# Patient Record
Sex: Female | Born: 1950 | Race: White | Hispanic: No | Marital: Single | Smoking: Never smoker
Health system: Southern US, Community
[De-identification: ages and names within clinical notes are randomized; demographics above are authoritative.]

## PROBLEM LIST (undated history)

## (undated) DIAGNOSIS — K219 Gastro-esophageal reflux disease without esophagitis: Secondary | ICD-10-CM

## (undated) DIAGNOSIS — Z8619 Personal history of other infectious and parasitic diseases: Secondary | ICD-10-CM

## (undated) DIAGNOSIS — Z8709 Personal history of other diseases of the respiratory system: Secondary | ICD-10-CM

## (undated) DIAGNOSIS — K279 Peptic ulcer, site unspecified, unspecified as acute or chronic, without hemorrhage or perforation: Secondary | ICD-10-CM

## (undated) DIAGNOSIS — R011 Cardiac murmur, unspecified: Secondary | ICD-10-CM

## (undated) DIAGNOSIS — Z9889 Other specified postprocedural states: Secondary | ICD-10-CM

## (undated) DIAGNOSIS — M75100 Unspecified rotator cuff tear or rupture of unspecified shoulder, not specified as traumatic: Secondary | ICD-10-CM

## (undated) DIAGNOSIS — R918 Other nonspecific abnormal finding of lung field: Secondary | ICD-10-CM

## (undated) DIAGNOSIS — R079 Chest pain, unspecified: Secondary | ICD-10-CM

## (undated) DIAGNOSIS — M1990 Unspecified osteoarthritis, unspecified site: Secondary | ICD-10-CM

## (undated) DIAGNOSIS — I10 Essential (primary) hypertension: Secondary | ICD-10-CM

## (undated) DIAGNOSIS — T8859XA Other complications of anesthesia, initial encounter: Secondary | ICD-10-CM

## (undated) DIAGNOSIS — N390 Urinary tract infection, site not specified: Secondary | ICD-10-CM

## (undated) DIAGNOSIS — G8929 Other chronic pain: Secondary | ICD-10-CM

## (undated) DIAGNOSIS — T4145XA Adverse effect of unspecified anesthetic, initial encounter: Secondary | ICD-10-CM

## (undated) DIAGNOSIS — Z87442 Personal history of urinary calculi: Secondary | ICD-10-CM

## (undated) DIAGNOSIS — M549 Dorsalgia, unspecified: Secondary | ICD-10-CM

## (undated) DIAGNOSIS — J309 Allergic rhinitis, unspecified: Secondary | ICD-10-CM

## (undated) DIAGNOSIS — J45909 Unspecified asthma, uncomplicated: Secondary | ICD-10-CM

## (undated) DIAGNOSIS — R112 Nausea with vomiting, unspecified: Secondary | ICD-10-CM

## (undated) HISTORY — DX: Peptic ulcer, site unspecified, unspecified as acute or chronic, without hemorrhage or perforation: K27.9

## (undated) HISTORY — DX: Essential (primary) hypertension: I10

## (undated) HISTORY — DX: Gastro-esophageal reflux disease without esophagitis: K21.9

## (undated) HISTORY — DX: Other nonspecific abnormal finding of lung field: R91.8

## (undated) HISTORY — DX: Unspecified osteoarthritis, unspecified site: M19.90

## (undated) HISTORY — DX: Other chronic pain: G89.29

## (undated) HISTORY — PX: EYE SURGERY: SHX253

## (undated) HISTORY — PX: BACK SURGERY: SHX140

## (undated) HISTORY — DX: Chest pain, unspecified: R07.9

## (undated) HISTORY — DX: Dorsalgia, unspecified: M54.9

---

## 1997-06-09 HISTORY — PX: VAGINAL HYSTERECTOMY: SUR661

## 1997-08-22 HISTORY — PX: OTHER SURGICAL HISTORY: SHX169

## 2004-04-17 ENCOUNTER — Ambulatory Visit: Payer: Self-pay | Admitting: Internal Medicine

## 2005-01-29 ENCOUNTER — Ambulatory Visit: Payer: Self-pay | Admitting: Internal Medicine

## 2005-03-31 ENCOUNTER — Emergency Department: Payer: Self-pay | Admitting: Emergency Medicine

## 2005-04-08 ENCOUNTER — Ambulatory Visit: Payer: Self-pay | Admitting: Internal Medicine

## 2005-04-21 ENCOUNTER — Ambulatory Visit: Payer: Self-pay | Admitting: Internal Medicine

## 2005-12-19 ENCOUNTER — Ambulatory Visit: Payer: Self-pay | Admitting: Gastroenterology

## 2005-12-29 ENCOUNTER — Ambulatory Visit: Payer: Self-pay | Admitting: Gastroenterology

## 2008-01-18 ENCOUNTER — Emergency Department: Payer: Self-pay | Admitting: Emergency Medicine

## 2008-01-18 ENCOUNTER — Other Ambulatory Visit: Payer: Self-pay

## 2008-07-20 ENCOUNTER — Ambulatory Visit: Payer: Self-pay | Admitting: Internal Medicine

## 2009-06-15 ENCOUNTER — Ambulatory Visit: Payer: Self-pay | Admitting: General Practice

## 2009-07-26 ENCOUNTER — Ambulatory Visit: Payer: Self-pay | Admitting: Internal Medicine

## 2010-07-03 ENCOUNTER — Observation Stay: Payer: Self-pay | Admitting: Internal Medicine

## 2010-09-09 ENCOUNTER — Ambulatory Visit: Payer: Self-pay | Admitting: Internal Medicine

## 2010-10-30 ENCOUNTER — Ambulatory Visit: Payer: Self-pay | Admitting: Gastroenterology

## 2010-11-05 ENCOUNTER — Ambulatory Visit: Payer: Self-pay | Admitting: Gastroenterology

## 2010-12-01 ENCOUNTER — Ambulatory Visit: Payer: Self-pay | Admitting: General Practice

## 2011-01-08 HISTORY — PX: ESOPHAGOGASTRODUODENOSCOPY: SHX1529

## 2011-01-17 ENCOUNTER — Ambulatory Visit: Payer: Self-pay | Admitting: Gastroenterology

## 2011-01-21 ENCOUNTER — Ambulatory Visit: Payer: Self-pay | Admitting: Internal Medicine

## 2011-01-21 LAB — PATHOLOGY REPORT

## 2011-03-06 ENCOUNTER — Ambulatory Visit: Payer: Self-pay | Admitting: Internal Medicine

## 2011-06-10 HISTORY — PX: COLONOSCOPY: SHX174

## 2011-09-23 ENCOUNTER — Ambulatory Visit: Payer: Self-pay | Admitting: Internal Medicine

## 2012-01-27 ENCOUNTER — Ambulatory Visit: Payer: Self-pay | Admitting: Internal Medicine

## 2012-03-10 ENCOUNTER — Ambulatory Visit: Payer: Self-pay | Admitting: Unknown Physician Specialty

## 2012-03-23 ENCOUNTER — Telehealth: Payer: Self-pay | Admitting: Internal Medicine

## 2012-03-23 NOTE — Telephone Encounter (Signed)
The results were probably sent to [facility].  I have not received.  Thanks.

## 2012-03-23 NOTE — Telephone Encounter (Signed)
Pt called wanted follow up appointment from ct done couple weeks ago at [facility]

## 2012-03-26 NOTE — Telephone Encounter (Deleted)
Called pt to let her know that she needs to contact [place] about her ct.

## 2012-03-26 NOTE — Telephone Encounter (Signed)
Called pt at home and left message to let her know that she needs to contact [place] about her ct.

## 2012-03-31 NOTE — Telephone Encounter (Signed)
Called pt at home and informed her that we have not received her results. Informed pt to contact [facility] about her results and records. Pt stated that she would contact [facility].

## 2012-04-30 ENCOUNTER — Ambulatory Visit: Payer: Self-pay | Admitting: Gastroenterology

## 2012-05-10 ENCOUNTER — Ambulatory Visit: Payer: Self-pay | Admitting: Gastroenterology

## 2012-05-18 ENCOUNTER — Telehealth: Payer: Self-pay | Admitting: Internal Medicine

## 2012-05-18 NOTE — Telephone Encounter (Signed)
Pt is calling and saying that she sent for her records at [facility]. She is saying that she really needs to see Dr. Lorin Picket because of her condition ???

## 2012-05-18 NOTE — Telephone Encounter (Signed)
Called patient and left message for her to return call

## 2012-05-18 NOTE — Telephone Encounter (Signed)
Patient is returning your call.  

## 2012-05-18 NOTE — Telephone Encounter (Signed)
Need to know what is going on with pt.  Who did she last see at [facility] and who ordered the scan.  Need more information.

## 2012-05-19 NOTE — Telephone Encounter (Signed)
Called patient back. Patient wants to see you about her past issues of pain under her ribs. Patient is still having problems. (Ulcers)

## 2012-05-19 NOTE — Telephone Encounter (Signed)
I can see her at 11:45 on Friday 05/21/12.

## 2012-05-19 NOTE — Telephone Encounter (Signed)
Patient notified. Appointment made.

## 2012-05-19 NOTE — Telephone Encounter (Signed)
Pt is returning your call and was wanting an appt as soon as possible with Dr. Lorin Picket.

## 2012-05-20 NOTE — Telephone Encounter (Signed)
Per Patient Dawn Vargas scheduled appointment 05-21-12@11 :00

## 2012-05-21 ENCOUNTER — Encounter: Payer: Self-pay | Admitting: Internal Medicine

## 2012-05-21 ENCOUNTER — Ambulatory Visit (INDEPENDENT_AMBULATORY_CARE_PROVIDER_SITE_OTHER): Payer: No Typology Code available for payment source | Admitting: Internal Medicine

## 2012-05-21 VITALS — BP 130/84 | HR 74 | Temp 98.8°F | Ht 65.5 in | Wt 190.2 lb

## 2012-05-21 DIAGNOSIS — I10 Essential (primary) hypertension: Secondary | ICD-10-CM

## 2012-05-21 DIAGNOSIS — K219 Gastro-esophageal reflux disease without esophagitis: Secondary | ICD-10-CM

## 2012-05-21 DIAGNOSIS — R911 Solitary pulmonary nodule: Secondary | ICD-10-CM

## 2012-05-21 DIAGNOSIS — N200 Calculus of kidney: Secondary | ICD-10-CM

## 2012-05-23 ENCOUNTER — Telehealth: Payer: Self-pay | Admitting: Internal Medicine

## 2012-05-23 ENCOUNTER — Encounter: Payer: Self-pay | Admitting: Internal Medicine

## 2012-05-23 DIAGNOSIS — R911 Solitary pulmonary nodule: Secondary | ICD-10-CM | POA: Insufficient documentation

## 2012-05-23 DIAGNOSIS — K219 Gastro-esophageal reflux disease without esophagitis: Secondary | ICD-10-CM | POA: Insufficient documentation

## 2012-05-23 DIAGNOSIS — I10 Essential (primary) hypertension: Secondary | ICD-10-CM | POA: Insufficient documentation

## 2012-05-23 DIAGNOSIS — N200 Calculus of kidney: Secondary | ICD-10-CM | POA: Insufficient documentation

## 2012-05-23 NOTE — Telephone Encounter (Signed)
Please schedule her for a physical in 4/14 - after 09/17/11.  Thanks.

## 2012-05-23 NOTE — Assessment & Plan Note (Signed)
Blood pressure under good control.  Same med regimen.  Check metabolic panel.

## 2012-05-23 NOTE — Progress Notes (Signed)
Subjective:    Patient ID: Dawn Vargas, female    DOB: 07/09/1950, 61 y.o.   MRN: [id number]  HPI 61 year old female with past history of chronic back pain requiring surgery 1989, hypertension and IBS.  She comes in today for a work in with concerns regarding persistent abdominal discomfort.  She was originally hospitalized in 06/2010 for discomfort and had a heart w/up.  Was negative.  Subsequently had EGD - revealed ulcers.  Instructed to take carafate.  Did well for a while.  02/2012 - pain returned.  Saw GI 03/10/12.  Followed up [facility] in 11/13.  Was given a Prevpac.  Did not tolerate Biaxin, but states the antibiotic made her feel better.  After off, again developed bloating and discomfort.  Some nausea.  04/29/12 EGD and colonoscopy.  EGD - irregular Z line, tortuous esophagus and normal stomach and duodenum.  Colonoscopy revealed a one 2mm polyp in the sigmoid colon and redundant colon.  Was given hyoscyamine.  Did not tolerate - caused dry mouth, depression and headaches.  The discomfort is localized under the ribs - bilateral.  No associated bowel change.  She had a dental implant yesterday and is on abx for this.  Since being on abx, the symptoms have improved.  She feels better now.  No change with food or eating.  Had a recent MRI (adrenal) - normal.  CT chest ok.    Past Medical History  Diagnosis Date  . Hypertension   . Chronic back pain     s/p surgery for herniated disc 93/89)  . Arthritis   . GERD (gastroesophageal reflux disease)   . Hypertension     Review of Systems Patient denies any headache, lightheadedness or dizziness.  No significant sinus or allergy symptoms.  No chest pain, tightness or palpitations.  No increased shortness of breath, cough or congestion.  Does report the discomfort as outlined.  W/up as outlined.   No bowel change.  No BRBPR or melana.  No urine change.   Symptoms better on abx.      Objective:   Physical Exam Filed Vitals:   05/21/12 1223  BP: 130/84  Pulse: 74  Temp: 98.8 F (12.67 C)   61 year old female in no acute distress.   HEENT:  Nares - clear.  OP- without lesions or erythema.  NECK:  Supple, nontender.  No audible bruit.   HEART:  Appears to be regular. LUNGS:  Without crackles or wheezing audible.  Respirations even and unlabored.   RADIAL PULSE:  Equal bilaterally.  ABDOMEN:  Soft, nontender.  No audible abdominal bruit.   EXTREMITIES:  No increased edema to be present.                  Assessment & Plan:  ABDOMINAL DISCOMFORT.  Symptoms as outlined.  Has seen GI.  W/up as outlined.  Will obtain labs and recent scans for review.  Feeling better now.  May need CT abdomen/pelvis if persistent.    CARDIOVASCULAR.  Previous stress test negative for ischemia.  Continue risk factor modification.   INCREASED PSYCHOSOCIAL STRESSORS.  Doing well.  Follow.    PREVIOUS ABNORMAL MAMMOGRAM.  Mammogram 01/22/11 - Birads II.  Obtain results of 2013 mammogram.   LEFT ADRENAL FULLNESS.  Adrenal fullness on CT chest.  Appears to be nonfunctioning.  Recent MRI per her report - negative.  Obtain results.    HEALTH MAINTENANCE.  Physical 09/17/11.  She is s/p hysterectomy.  Mammogram - as above. Colonoscopy 04/30/12 revealed one 2mm polyp in the sigmoid colon and redundant colon.

## 2012-05-23 NOTE — Assessment & Plan Note (Signed)
Previous CT revealed 4mm nodule (LLL).  Just had a follow up CT - states was ok.  Follow.  Obtain results.  (CT 03/06/11 revealed the nodules to be unchanged. ) obtain last CT.

## 2012-05-23 NOTE — Assessment & Plan Note (Addendum)
Stable.  Currently without symptoms.

## 2012-05-23 NOTE — Assessment & Plan Note (Signed)
On Aciphex.  EGD 11/13 revealed an irregular Z line with tortuous esophagus.  Currently no reflux.  Follow.

## 2012-05-26 NOTE — Telephone Encounter (Signed)
Opened in error

## 2012-06-07 NOTE — Telephone Encounter (Signed)
Left Message for pt to call back and schedule.

## 2012-06-09 ENCOUNTER — Telehealth: Payer: Self-pay | Admitting: Internal Medicine

## 2012-06-09 NOTE — Telephone Encounter (Signed)
Notify pt that I reviewed the outside scans and labs.  Work up for her persistent abdominal pain - has been unrevealing.  Please see how pt is feeling now (after completing abx).  Also she is due a physical with me after 09/16/12.  Need to schedule this appt.  Thanks.

## 2012-06-10 NOTE — Telephone Encounter (Signed)
Called patient, she was busy she will call me back.

## 2012-06-11 NOTE — Telephone Encounter (Signed)
Pain went away (some what) when she was taking amoxicillin, now that she is no longer taking abx, right side is bothering her now with a lot pain. Patient says now she know longer feels well. Does not expect a call tonight.

## 2012-06-11 NOTE — Telephone Encounter (Signed)
Pt left message on voice mail 1/2 @ 5:02 returning your call

## 2012-06-12 NOTE — Telephone Encounter (Signed)
Left message for pt.  I was going to try to work her in Monday at 9:00.

## 2012-06-13 NOTE — Telephone Encounter (Signed)
I called pt again. Still unable to reach.  I left her a message.  I explained the 9:00 spot had been taken and that I could see about working her in later in the day (or if Monday not good - a later day).  She is too call back about scheduling an appt.  Thanks.

## 2012-06-14 NOTE — Telephone Encounter (Signed)
I am unable to work her in today.  I can see her tomorrow at 3:00 - will need to block 30 min - thanks - abdominal pain

## 2012-06-14 NOTE — Telephone Encounter (Signed)
Patient returned call, she said that she would be willing to come in whenever you could fit her in today. We can reach all day on cell ([phone]).

## 2012-06-14 NOTE — Telephone Encounter (Signed)
Pt aware of appointment 

## 2012-06-15 ENCOUNTER — Encounter: Payer: Self-pay | Admitting: Internal Medicine

## 2012-06-15 ENCOUNTER — Ambulatory Visit (INDEPENDENT_AMBULATORY_CARE_PROVIDER_SITE_OTHER): Payer: No Typology Code available for payment source | Admitting: Internal Medicine

## 2012-06-15 VITALS — BP 130/80 | HR 69 | Temp 98.0°F | Ht 65.5 in | Wt 194.2 lb

## 2012-06-15 DIAGNOSIS — I10 Essential (primary) hypertension: Secondary | ICD-10-CM

## 2012-06-15 DIAGNOSIS — K219 Gastro-esophageal reflux disease without esophagitis: Secondary | ICD-10-CM

## 2012-06-15 DIAGNOSIS — R109 Unspecified abdominal pain: Secondary | ICD-10-CM

## 2012-06-15 DIAGNOSIS — R911 Solitary pulmonary nodule: Secondary | ICD-10-CM

## 2012-06-17 ENCOUNTER — Ambulatory Visit: Payer: Self-pay | Admitting: Internal Medicine

## 2012-06-21 ENCOUNTER — Other Ambulatory Visit: Payer: Self-pay | Admitting: Internal Medicine

## 2012-06-21 MED ORDER — SPIRONOLACTONE 25 MG PO TABS: 25.0000 mg | ORAL_TABLET | Freq: Every day | ORAL | Status: DC

## 2012-06-21 NOTE — Telephone Encounter (Signed)
spironolactone (ALDACTONE) 25 MG tablet  # 30

## 2012-06-21 NOTE — Telephone Encounter (Signed)
Sent in to pharmacy.  

## 2012-06-22 ENCOUNTER — Telehealth: Payer: Self-pay | Admitting: *Deleted

## 2012-06-22 ENCOUNTER — Encounter: Payer: Self-pay | Admitting: Internal Medicine

## 2012-06-22 NOTE — Telephone Encounter (Signed)
Called patient with results. Patient stated that she wants to go ahead with HIDA scan. The best days for patient will be on Wednesday 06/23/12 11-1 and Thursday 06/24/12 11-1. Monday 06/28/12 all day. Please call patient with appointment.

## 2012-06-28 ENCOUNTER — Telehealth: Payer: Self-pay | Admitting: Internal Medicine

## 2012-06-28 ENCOUNTER — Encounter: Payer: Self-pay | Admitting: Internal Medicine

## 2012-06-28 DIAGNOSIS — R109 Unspecified abdominal pain: Secondary | ICD-10-CM

## 2012-06-28 NOTE — Progress Notes (Signed)
Subjective:    Patient ID: Dawn Vargas, female    DOB: May 07, 1951, 62 y.o.   MRN: [id number]  Abdominal Pain  62 year old female with past history of chronic back pain requiring surgery 1989, hypertension and IBS.  She comes in today for a work in with concerns regarding persistent abdominal discomfort.  She was originally hospitalized in 06/2010 for discomfort and had a heart w/up.  Was negative.  Subsequently had EGD - revealed ulcers.  Instructed to take carafate.  Did well for a while.  02/2012 - pain returned.  Saw GI 03/10/12.  Followed up at [facility] in 11/13.  Was given a Prevpac.  Did not tolerate Biaxin, but states the antibiotic made her feel better.  After off, again developed bloating and discomfort.  Some nausea.  04/29/12 EGD and colonoscopy.  EGD - irregular Z line, tortuous esophagus and normal stomach and duodenum.  Colonoscopy revealed a one 2mm polyp in the sigmoid colon and redundant colon.  Was given hyoscyamine.  Did not tolerate - caused dry mouth, depression and headaches.  The discomfort is localized under the ribs - bilateral.  No associated bowel change.  Has noticed more on the right lately.   Had a recent MRI (adrenal) - normal. CT chest ok.  Last week - worse.  Increased bloating.  Felt sick.  Restarted the amoxicillin (she had left over) with noted improvement.  Some better today.  No vomiting.  Eating and drinking.     Past Medical History  Diagnosis Date  . Hypertension   . Chronic back pain     s/p surgery for herniated disc 93/89)  . Arthritis   . GERD (gastroesophageal reflux disease)   . Hypertension     Current Outpatient Prescriptions on File Prior to Visit  Medication Sig Dispense Refill  . amoxicillin (AMOXIL) 500 MG capsule Take 500 mg by mouth 4 (four) times daily.      Marland Kitchen losartan (COZAAR) 25 MG tablet Take 2 per day      . traMADol (ULTRAM) 50 MG tablet Take 50 mg by mouth as needed.      Marland Kitchen spironolactone (ALDACTONE) 25 MG tablet Take 1  tablet (25 mg total) by mouth daily.  30 tablet  5    Review of Systems  Gastrointestinal: Positive for abdominal pain.  Patient denies any headache, lightheadedness or dizziness.  No significant sinus or allergy symptoms.  No chest pain, tightness or palpitations.  No increased shortness of breath, cough or congestion.  Does report the discomfort as outlined.  W/up as outlined.   No bowel change.  No BRBPR or melana.  No urine change.   Symptoms better on abx.      Objective:   Physical Exam  Filed Vitals:   06/15/12 1509  BP: 130/80  Pulse: 69  Temp: 98 F (31.72 C)   62 year old female in no acute distress.   HEENT:  Nares - clear.  OP- without lesions or erythema.  NECK:  Supple, nontender.  No audible bruit.   HEART:  Appears to be regular. LUNGS:  Without crackles or wheezing audible.  Respirations even and unlabored.   RADIAL PULSE:  Equal bilaterally.  ABDOMEN:  Soft.  No significant tenderness to palpation.  No audible abdominal bruit.   EXTREMITIES:  No increased edema to be present.                  Assessment & Plan:  ABDOMINAL DISCOMFORT.  Symptoms  as outlined.  Has seen GI.  W/up as outlined.  MRI recently did not reveal any acute abdominal abnormality.  CT chest - did no reveal any acute abdominal abnormality - what was visualized.  Will recheck abdominal ultrasound.  May need HIDA scan.  further w/up pending results.  Recent labs unrevealing (normal liver panel, amylase and lipase).      CARDIOVASCULAR.  Previous stress test negative for ischemia.  Continue risk factor modification.   INCREASED PSYCHOSOCIAL STRESSORS.  Doing well.  Follow.    PREVIOUS ABNORMAL MAMMOGRAM.  Mammogram 01/22/11 - Birads II.  Obtain results of 2013 mammogram.   LEFT ADRENAL FULLNESS.  Adrenal fullness on CT chest.  Appears to be nonfunctioning.  Recent MRI negative.    HEALTH MAINTENANCE.  Physical 09/17/11.  She is s/p hysterectomy.  Mammogram - as above. Colonoscopy 04/30/12 revealed  one 2mm polyp in the sigmoid colon and redundant colon.

## 2012-06-28 NOTE — Assessment & Plan Note (Signed)
Obtain CT and review.  Per pt - ok.  Follow.

## 2012-06-28 NOTE — Assessment & Plan Note (Signed)
Reflux symptoms appear to be controlled.  Being followed by GI.  Just worked up and evaluated.

## 2012-06-28 NOTE — Telephone Encounter (Signed)
Order placed for HIDA scan.

## 2012-06-28 NOTE — Assessment & Plan Note (Signed)
Blood pressure much better.  Follow.   

## 2012-06-30 ENCOUNTER — Encounter: Payer: Self-pay | Admitting: Internal Medicine

## 2012-07-15 ENCOUNTER — Ambulatory Visit: Payer: Self-pay | Admitting: Internal Medicine

## 2012-07-22 ENCOUNTER — Telehealth: Payer: Self-pay | Admitting: Internal Medicine

## 2012-07-22 DIAGNOSIS — R109 Unspecified abdominal pain: Secondary | ICD-10-CM

## 2012-07-22 NOTE — Telephone Encounter (Signed)
Order placed for referral to general surgery - Dr Lemar Livings.  I will place the HIDA scan on your desk.  Please also send my last couple of notes and her scans and ultrasounds.

## 2012-08-11 ENCOUNTER — Encounter: Payer: Self-pay | Admitting: Internal Medicine

## 2012-08-25 ENCOUNTER — Ambulatory Visit: Payer: Self-pay | Admitting: General Surgery

## 2012-08-25 DIAGNOSIS — K811 Chronic cholecystitis: Secondary | ICD-10-CM

## 2012-08-25 HISTORY — PX: CHOLECYSTECTOMY: SHX55

## 2012-08-26 LAB — PATHOLOGY REPORT

## 2012-08-31 ENCOUNTER — Telehealth: Payer: Self-pay | Admitting: General Surgery

## 2012-08-31 NOTE — Telephone Encounter (Signed)
PT CALLED & WOULD LIKE A RETURN TO WORK NOTE FOR 09-06-12(? LIMITATIONS). P/O APPT [facility] FOR 09-15-12. SHE ALSO WANTED TO REPORT HER REACTION TO OXYCODONE GIVEN AFTER SX.SHE WAS VERY DIZZY/LIGHT HEADED,NO APPETITE.SHE STOPPED TAKING IT AND HER SYMPTOMS WENT AWAY.SHE HAS BEEN TAKING ADVIL INSTEAD./MTH

## 2012-09-02 ENCOUNTER — Encounter: Payer: Self-pay | Admitting: General Surgery

## 2012-09-15 ENCOUNTER — Encounter: Payer: Self-pay | Admitting: General Surgery

## 2012-09-15 ENCOUNTER — Ambulatory Visit (INDEPENDENT_AMBULATORY_CARE_PROVIDER_SITE_OTHER): Payer: No Typology Code available for payment source | Admitting: General Surgery

## 2012-09-15 VITALS — BP 144/76 | HR 60 | Resp 12 | Ht 65.0 in | Wt 188.0 lb

## 2012-09-15 DIAGNOSIS — K811 Chronic cholecystitis: Secondary | ICD-10-CM

## 2012-09-15 DIAGNOSIS — G8929 Other chronic pain: Secondary | ICD-10-CM

## 2012-09-15 DIAGNOSIS — R1011 Right upper quadrant pain: Secondary | ICD-10-CM

## 2012-09-15 NOTE — Progress Notes (Signed)
Patient ID: Dawn Vargas, female   DOB: March 29, 1951, 62 y.o.   MRN: [id number]  Chief Complaint  Patient presents with  . Routine Post Op    gallbladder    HPI Dawn Vargas is a 62 y.o. female here today for a post op gallbladder surgery done August 25 2012. Patient states mild discomfort right abdomen occasionally requiring no pain medication, and she is back at work. The patient reports a severe pain she was experiencing prior to surgery has completely resolved. HPI  Past Medical History  Diagnosis Date  . Hypertension   . Chronic back pain     s/p surgery for herniated disc 93/89)  . Arthritis   . GERD (gastroesophageal reflux disease)   . Hypertension     Past Surgical History  Procedure Laterality Date  . Vaginal hysterectomy  1999    laparoscopic assisted  . Cholecystectomy  August 25 2012    Family History  Problem Relation Age of Onset  . Dementia Father   . Prostate cancer Father   . Heart disease Mother     myocardial infarction, congestive heart failure  . Hypertension Mother   . Heart disease Brother     angioplasty  . Hypertension Brother   . Hypertension Sister   . Breast cancer Neg Hx   . Colon cancer Neg Hx     Social History History  Substance Use Topics  . Smoking status: Former Games developer  . Smokeless tobacco: Never Used  . Alcohol Use: No    Allergies  Allergen Reactions  . Benadryl (Diphenhydramine Hcl) Hives  . Caladryl (Pramoxine-Calamine)   . Cleocin (Clindamycin Hcl) Other (See Comments)    GI upset   . Oxycodone     dizzy    Current Outpatient Prescriptions  Medication Sig Dispense Refill  . losartan (COZAAR) 25 MG tablet Take 2 per day      . spironolactone (ALDACTONE) 25 MG tablet Take 1 tablet (25 mg total) by mouth daily.  30 tablet  5   No current facility-administered medications for this visit.    Review of Systems Review of Systems  Constitutional: Negative.   Respiratory: Negative.   Cardiovascular: Negative.      Blood pressure 144/76, pulse 60, resp. rate 12, height 5\' 5"  (1.651 m), weight 188 lb (85.276 kg), last menstrual period 05/21/1998.  Physical Exam Physical Exam  Constitutional: She is oriented to person, place, and time. She appears well-developed and well-nourished.  Cardiovascular: Normal rate and regular rhythm.   Pulmonary/Chest: Effort normal and breath sounds normal.  Abdominal: Soft.  Neurological: She is alert and oriented to person, place, and time.  Skin: Skin is warm and dry.  Well healed lap sites  Data Reviewed Pathology showed no evidence of chronic cholecystitis or cholelithiasis.  Assessment    Right upper quadrant pain resolved status post cholecystectomy    Plan    The patient is released to full activity.       Dawn Vargas 09/16/2012, 7:57 PM

## 2012-09-15 NOTE — Patient Instructions (Addendum)
Resume activities as tolerated

## 2012-09-20 ENCOUNTER — Other Ambulatory Visit: Payer: Self-pay | Admitting: *Deleted

## 2012-09-20 MED ORDER — LOSARTAN POTASSIUM 25 MG PO TABS: 25.0000 mg | ORAL_TABLET | Freq: Two times a day (BID) | ORAL | Status: DC

## 2012-09-20 NOTE — Telephone Encounter (Signed)
Pt has next appt in May, & was recently seen in January-refilled RX x 6months

## 2012-10-08 ENCOUNTER — Ambulatory Visit (INDEPENDENT_AMBULATORY_CARE_PROVIDER_SITE_OTHER): Payer: No Typology Code available for payment source | Admitting: Internal Medicine

## 2012-10-08 ENCOUNTER — Encounter: Payer: Self-pay | Admitting: Internal Medicine

## 2012-10-08 VITALS — BP 140/80 | HR 54 | Temp 97.7°F | Ht 65.0 in | Wt 187.2 lb

## 2012-10-08 DIAGNOSIS — R911 Solitary pulmonary nodule: Secondary | ICD-10-CM

## 2012-10-08 DIAGNOSIS — N200 Calculus of kidney: Secondary | ICD-10-CM

## 2012-10-08 DIAGNOSIS — M549 Dorsalgia, unspecified: Secondary | ICD-10-CM

## 2012-10-08 DIAGNOSIS — K219 Gastro-esophageal reflux disease without esophagitis: Secondary | ICD-10-CM

## 2012-10-08 DIAGNOSIS — I10 Essential (primary) hypertension: Secondary | ICD-10-CM

## 2012-10-08 DIAGNOSIS — K259 Gastric ulcer, unspecified as acute or chronic, without hemorrhage or perforation: Secondary | ICD-10-CM

## 2012-10-08 DIAGNOSIS — G8929 Other chronic pain: Secondary | ICD-10-CM

## 2012-10-08 MED ORDER — TRAMADOL HCL 50 MG PO TABS: 50.0000 mg | ORAL_TABLET | Freq: Two times a day (BID) | ORAL | Status: DC | PRN

## 2012-10-09 ENCOUNTER — Encounter: Payer: Self-pay | Admitting: Internal Medicine

## 2012-10-09 DIAGNOSIS — G8929 Other chronic pain: Secondary | ICD-10-CM | POA: Insufficient documentation

## 2012-10-09 DIAGNOSIS — M549 Dorsalgia, unspecified: Secondary | ICD-10-CM | POA: Insufficient documentation

## 2012-10-09 DIAGNOSIS — K259 Gastric ulcer, unspecified as acute or chronic, without hemorrhage or perforation: Secondary | ICD-10-CM | POA: Insufficient documentation

## 2012-10-09 NOTE — Assessment & Plan Note (Addendum)
Previous CT revealed 4mm nodule (LLL).  CT 03/06/11 revealed the nodules to be unchanged.  Follow up CT chest 03/10/12 revealed tiny nodules in both lungs stable.   No new lesions.  Currently asymptomatic.  Follow.

## 2012-10-09 NOTE — Progress Notes (Signed)
Subjective:    Patient ID: Dawn Vargas, female    DOB: [birth year], 62 y.o.   MRN: [id number]  Abdominal Pain  62 year old female with past history of chronic back pain requiring surgery 1989, hypertension and IBS.  She comes in today for a scheduled follow up.  Was initially scheduled for her physical, but states she has several other issues she wants to discuss and wants to postpone her physical.  She was originally hospitalized in 06/2010 for discomfort and had a heart w/up.  Was negative.  Subsequently had EGD - revealed ulcers.  Instructed to take carafate.  Did well for a while.  02/2012 - pain returned.  Saw GI 03/10/12.   04/29/12 EGD and colonoscopy.  EGD - irregular Z line, tortuous esophagus and normal stomach and duodenum.  Colonoscopy revealed a one 2mm polyp in the sigmoid colon and redundant colon.  Last visit here was having persistent abdominal pain.  HIDA scan revealed decreased gallbladder function.  Saw Dr Lemar Livings.  S/p cholecystectomy.  abominal pain is better.  Not an issue for her now.  Energy better.  Eating better.  She is now having some increased issues with her low back and right hip.  Increased pain since February.  States she was doing a lot of pushing and pulling in February and aggravated her back.  Tramadol helps.  Out of tramadol.  Unable to take antiinflammatories.  No numbness or tingling.  Pain extending down the leg.  She also reports she is on amoxicillin now for sinus/uri.  Doing better.  Breathing stable.  No sob.  No cough.     Past Medical History  Diagnosis Date  . Hypertension   . Chronic back pain     s/p surgery for herniated disc 93/89)  . Arthritis   . GERD (gastroesophageal reflux disease)   . Hypertension     Current Outpatient Prescriptions on File Prior to Visit  Medication Sig Dispense Refill  . losartan (COZAAR) 25 MG tablet Take 1 tablet (25 mg total) by mouth 2 (two) times daily.  60 tablet  5  . spironolactone (ALDACTONE) 25 MG tablet  Take 1 tablet (25 mg total) by mouth daily.  30 tablet  5   No current facility-administered medications on file prior to visit.    Review of Systems  Gastrointestinal: Positive for abdominal pain.  Patient denies any headache, lightheadedness or dizziness.  No significant sinus or allergy symptoms now.  Improved.  On amoxicillin.   No chest pain, tightness or palpitations.  No increased shortness of breath, cough or congestion.  Does report the back/hip and right leg discomfort as outlined.  Tramadol helps.   No bowel change.  No BRBPR or melana.  No urine change.       Objective:   Physical Exam  Filed Vitals:   10/08/12 1153  BP: 140/80  Pulse: 54  Temp: 97.7 F (21.77 C)   62 year old female in no acute distress.   HEENT:  Nares - clear.  OP- without lesions or erythema.  NECK:  Supple, nontender.  No audible bruit.   HEART:  Appears to be regular. LUNGS:  Without crackles or wheezing audible.  Respirations even and unlabored.   RADIAL PULSE:  Equal bilaterally.  ABDOMEN:  Soft.  No significant tenderness to palpation.  No audible abdominal bruit.   EXTREMITIES:  No increased edema to be present.   MSK:  Some pulling in the right hip and right buttock region  with straight leg raise.  No motor weakness identified.                Assessment & Plan:  ABDOMINAL DISCOMFORT.  Resolved after gallbladder removed.  Follow.     CARDIOVASCULAR.  Previous stress test negative for ischemia.  Continue risk factor modification.   INCREASED PSYCHOSOCIAL STRESSORS.  Doing well.  Follow.    PREVIOUS ABNORMAL MAMMOGRAM.  Mammogram 01/22/11 - Birads II.  01/27/12 mammogram - Birads II.   LEFT ADRENAL FULLNESS.  Adrenal fullness on CT chest.  Appears to be nonfunctioning.  Recent MRI (09/23/11) negative.    HEALTH MAINTENANCE.  Physical 09/17/11.  She is s/p hysterectomy.  Schedule a physical for next visit.  Mammogram - as above. Colonoscopy 04/30/12 revealed one 2mm polyp in the sigmoid colon  and redundant colon.

## 2012-10-09 NOTE — Assessment & Plan Note (Signed)
Blood pressure has been much better.  Borderline today.  Same medication regimen.  Follow.  Have her spot check her pressures on outside checks.

## 2012-10-09 NOTE — Assessment & Plan Note (Signed)
Reflux symptoms appear to be controlled.  Being followed by GI.

## 2012-10-09 NOTE — Assessment & Plan Note (Signed)
Currently doing well.  Asymptomatic.  Followed by GI.

## 2012-10-09 NOTE — Assessment & Plan Note (Signed)
Stable.  Currently without symptoms.

## 2012-10-09 NOTE — Assessment & Plan Note (Signed)
Has had MRI in the past.  Worked up previously.  Increased pain overt the last few months.  Tramadol helps.  Unable to take antiinflammatories.  Has h/o ulcer disease.  Will xray L-S spine.  Refer to physical therapy.

## 2012-10-13 ENCOUNTER — Ambulatory Visit (INDEPENDENT_AMBULATORY_CARE_PROVIDER_SITE_OTHER)
Admission: RE | Admit: 2012-10-13 | Discharge: 2012-10-13 | Disposition: A | Discharge status: Home / Self Care | Payer: No Typology Code available for payment source | Source: Ambulatory Visit | Attending: Internal Medicine | Admitting: Internal Medicine

## 2012-10-13 DIAGNOSIS — M549 Dorsalgia, unspecified: Secondary | ICD-10-CM

## 2012-10-14 NOTE — Progress Notes (Signed)
LMTCB

## 2012-11-02 ENCOUNTER — Encounter: Payer: Self-pay | Admitting: *Deleted

## 2012-11-03 ENCOUNTER — Other Ambulatory Visit: Payer: Self-pay | Admitting: Internal Medicine

## 2012-11-03 NOTE — Telephone Encounter (Signed)
Ok to refill? Last filled 10/08/12   #30

## 2012-12-16 ENCOUNTER — Other Ambulatory Visit: Payer: Self-pay | Admitting: Internal Medicine

## 2012-12-20 ENCOUNTER — Encounter: Payer: Self-pay | Admitting: Internal Medicine

## 2012-12-31 ENCOUNTER — Encounter: Payer: No Typology Code available for payment source | Admitting: Internal Medicine

## 2013-01-20 ENCOUNTER — Telehealth: Payer: Self-pay | Admitting: Internal Medicine

## 2013-01-20 NOTE — Telephone Encounter (Signed)
FYI. Please advise.

## 2013-01-20 NOTE — Telephone Encounter (Signed)
Patient Information:  Caller Name: Hayle  Phone: [phone]  Patient: Dawn Vargas, Dawn Vargas  Gender: Female  DOB: Oct 12, 1950  Age: 62 Years  PCP: Dale Nance  Office Follow Up:  Does the office need to follow up with this patient?: Yes  Instructions For The Office: Discuss with MD and call back  RN Note:  Called from work. Morning BP's ranging 112-115/70-74.  BP 135/90 at 1245 before Losartin dose.  Also fatigued after eats and feels cold.  Noted these are also Losartin side effects per the Rx literature.  Asking if Potassium level is OK? Office scheduled appointment with Dannial Monarch, NP for 01/25/13. Please call back with MD recommendations.  Symptoms  Reason For Call & Symptoms: Emergent Call transferred from office:  Reports mild dizziness and hypotension; worse when wakes up in the morning.  Dizziness or light headedness present for last 6 weeks.  Reviewed Health History In EMR: Yes  Reviewed Medications In EMR: Yes  Reviewed Allergies In EMR: Yes  Reviewed Surgeries / Procedures: Yes  Date of Onset of Symptoms: 12/09/2012  Guideline(s) Used:  Dizziness  Disposition Per Guideline:   Discuss with PCP and Callback by Nurse Today  Reason For Disposition Reached:   Taking a medicine that could cause dizziness (e.g., blood pressure medications, diuretics)  Advice Given:  Some Causes of Temporary Dizziness:  Poor Fluid Intake - Not drinking enough fluids and being a little dehydrated is a common cause of temporary dizziness. This is always worse during hot weather.  Standing Up Suddenly - Standing up suddenly (especially getting out of bed) or prolonged standing in one place are common causes of temporary dizziness. Not drinking enough fluids always makes it worse. Certain medications can cause or increase this type of dizziness (e.g., blood pressure medications).  Rest for 1-2 Hours:  Lie down with feet elevated for 1 hour. This will improve blood flow and increase blood flow to the  brain.  Stand Up Slowly:  In the mornings, sit up for a few minutes before you stand up. That will help your blood flow make the adjustment.  If you have to stand up for long periods of time, contract and relax your leg muscles to help pump the blood back to the heart.  Sit down or lie down if you feel dizzy.  Call Back If:  Passes out (faints)  You become worse.  Patient Will Follow Care Advice:  YES

## 2013-01-20 NOTE — Telephone Encounter (Signed)
In reviewing, it appears the blood pressure has been ranging 115-130's systolic.  Not extremely low.  I would recommend her continue to monitor her blood pressure.  She takes her losartan 25mg  bid.  If she sees it is low in the am can hold the pm dose of losartan.  Also recommend a follow up earlier than 8/19.  Can she come tomorrow to see Dawn Vargas.

## 2013-01-20 NOTE — Telephone Encounter (Signed)
Tried to reach patient- no answer-left detailed message on voicemail

## 2013-01-21 NOTE — Telephone Encounter (Signed)
Pt coming in on Monday @ 9:30

## 2013-01-24 ENCOUNTER — Encounter: Payer: Self-pay | Admitting: Adult Health

## 2013-01-24 ENCOUNTER — Other Ambulatory Visit: Payer: Self-pay | Admitting: Internal Medicine

## 2013-01-24 ENCOUNTER — Ambulatory Visit (INDEPENDENT_AMBULATORY_CARE_PROVIDER_SITE_OTHER): Payer: No Typology Code available for payment source | Admitting: Adult Health

## 2013-01-24 VITALS — BP 118/82 | HR 60 | Temp 97.7°F | Resp 12 | Wt 185.5 lb

## 2013-01-24 DIAGNOSIS — R42 Dizziness and giddiness: Secondary | ICD-10-CM

## 2013-01-24 NOTE — Progress Notes (Signed)
  Subjective:    Patient ID: Dawn Vargas, female    DOB: 10/01/1950, 62 y.o.   MRN: [id number]  HPI  Patient is a pleasant 62 year old female who presents to clinic with complaints of feeling lightheaded mainly occurring in the morning. She believes this is associated with her blood pressure medication, losartan. Patient is currently taking losartan and spironolactone which both can cause dizziness and lightheadedness. Her blood pressures have been slightly erratic mainly because patient has not been taking medication as ordered because of side effects. She denies chest pain, shortness of breath.   Current Outpatient Prescriptions on File Prior to Visit  Medication Sig Dispense Refill  . spironolactone (ALDACTONE) 25 MG tablet TAKE ONE TABLET BY MOUTH EVERY DAY  30 tablet  0  . traMADol (ULTRAM) 50 MG tablet TAKE ONE TABLET BY MOUTH TWICE DAILY AS NEEDED FOR PAIN  30 tablet  0   No current facility-administered medications on file prior to visit.    Review of Systems  Constitutional: Negative.   HENT: Negative.   Respiratory: Negative.   Cardiovascular: Negative.   Neurological: Positive for light-headedness. Negative for tremors, syncope, weakness and numbness.   BP 118/82  Pulse 60  Temp(Src) 97.7 F (36.5 C) (Oral)  Resp 12  Wt 185 lb 8 oz (84.142 kg)  BMI 30.87 kg/m2  SpO2 97%  LMP 05/21/1998     Objective:   Physical Exam  Constitutional: She is oriented to person, place, and time. She appears well-developed and well-nourished. No distress.  HENT:  Head: Normocephalic and atraumatic.  Right Ear: External ear normal.  Left Ear: External ear normal.  Eyes: Conjunctivae and EOM are normal. Pupils are equal, round, and reactive to light.  Cardiovascular: Normal rate, regular rhythm, normal heart sounds and intact distal pulses.  Exam reveals no gallop and no friction rub.   No murmur heard. Pulmonary/Chest: Effort normal and breath sounds normal. No respiratory  distress. She has no wheezes. She has no rales.  Neurological: She is alert and oriented to person, place, and time. Coordination normal.  Skin: Skin is warm and dry.  Psychiatric: She has a normal mood and affect. Her behavior is normal. Judgment and thought content normal.      Assessment & Plan:

## 2013-01-24 NOTE — Assessment & Plan Note (Signed)
Side effects could be attributed to losartan or spironolactone. Patient is on spironolactone 25 mg daily and she is on losartan 25 mg twice a day. She has been taking her spironolactone as instructed; however, she is only been taking losartan 25 mg daily. Blood pressure appears to be well-controlled in the evening; however, some of her morning readings are slightly elevated. I have instructed patient to begin taking half the dose of spironolactone and maintaining the same dose of losartan. I have instructed her to make one change at a time so that we can determine which one of the medications or doses is affecting her the most. She will keep a log and record of blood pressure readings along with doses of medication so that week and determine how to better adjust her medications and help control her blood pressure better with fewer side effects. Return to clinic in one to 2 weeks.

## 2013-02-01 ENCOUNTER — Ambulatory Visit: Payer: Self-pay | Admitting: Internal Medicine

## 2013-02-01 LAB — HM MAMMOGRAPHY

## 2013-02-08 ENCOUNTER — Encounter: Payer: No Typology Code available for payment source | Admitting: Internal Medicine

## 2013-02-14 ENCOUNTER — Encounter: Payer: Self-pay | Admitting: Internal Medicine

## 2013-02-17 ENCOUNTER — Encounter: Payer: Self-pay | Admitting: Internal Medicine

## 2013-02-17 ENCOUNTER — Ambulatory Visit (INDEPENDENT_AMBULATORY_CARE_PROVIDER_SITE_OTHER): Payer: No Typology Code available for payment source | Admitting: Internal Medicine

## 2013-02-17 VITALS — BP 140/80 | HR 70 | Temp 98.5°F | Ht 64.75 in | Wt 187.5 lb

## 2013-02-17 DIAGNOSIS — M25519 Pain in unspecified shoulder: Secondary | ICD-10-CM

## 2013-02-17 DIAGNOSIS — R911 Solitary pulmonary nodule: Secondary | ICD-10-CM

## 2013-02-17 DIAGNOSIS — M542 Cervicalgia: Secondary | ICD-10-CM | POA: Insufficient documentation

## 2013-02-17 DIAGNOSIS — M549 Dorsalgia, unspecified: Secondary | ICD-10-CM

## 2013-02-17 DIAGNOSIS — R42 Dizziness and giddiness: Secondary | ICD-10-CM

## 2013-02-17 DIAGNOSIS — G8929 Other chronic pain: Secondary | ICD-10-CM

## 2013-02-17 DIAGNOSIS — K259 Gastric ulcer, unspecified as acute or chronic, without hemorrhage or perforation: Secondary | ICD-10-CM

## 2013-02-17 DIAGNOSIS — K219 Gastro-esophageal reflux disease without esophagitis: Secondary | ICD-10-CM

## 2013-02-17 DIAGNOSIS — I10 Essential (primary) hypertension: Secondary | ICD-10-CM

## 2013-02-17 DIAGNOSIS — N200 Calculus of kidney: Secondary | ICD-10-CM

## 2013-02-17 NOTE — Assessment & Plan Note (Signed)
Resolved.  Off spironolactone.  Blood pressure doing well.

## 2013-02-17 NOTE — Assessment & Plan Note (Signed)
Blood pressure has been much better.  Same medication regimen.  Follow.  Check metabolic panel.

## 2013-02-17 NOTE — Assessment & Plan Note (Signed)
Reflux symptoms appear to be controlled.  Being followed by GI.

## 2013-02-17 NOTE — Assessment & Plan Note (Signed)
Lower back pain better.  Follow.

## 2013-02-17 NOTE — Assessment & Plan Note (Signed)
Obtain CT and review.  Per pt - ok.  Follow.

## 2013-02-17 NOTE — Patient Instructions (Signed)
Try miralax daily

## 2013-02-17 NOTE — Progress Notes (Signed)
Subjective:    Patient ID: Dawn Vargas, female    DOB: 1950/09/20, 62 y.o.   MRN: [id number]  HPI 62 year old female with past history of chronic back pain requiring surgery 1989, hypertension and IBS.  She comes in today to follow up on these issues as well as for a complete physical exam.  She was originally hospitalized in 06/2010 for discomfort and had a heart w/up.  Was negative.  Subsequently had EGD - revealed ulcers.  Instructed to take carafate.  Did well for a while.  02/2012 - pain returned.   See previous notes for details.  Subsequently had her gall bladder removed.   She feels better now.  No abdominal pain or cramping.  Initially had diarrhea, but now is constipated.  Had soft bowel movement yesterday.  Lower back is better.  She is having increased pain and discomfort in her neck and shoulders.  Persistent.  Discussed therapy.  Also reports feeling cold.  Change for her.  Eating and drinking well.  No nausea or vomiting.  No chest pain or tightness.  No sob.  Had a recent MRI (adrenal) - normal.  CT chest ok.     Past Medical History  Diagnosis Date  . Hypertension   . Chronic back pain     s/p surgery for herniated disc 93/89)  . Arthritis   . GERD (gastroesophageal reflux disease)   . Hypertension     Current Outpatient Prescriptions on File Prior to Visit  Medication Sig Dispense Refill  . losartan (COZAAR) 25 MG tablet Take 50 mg by mouth daily.      . Multiple Vitamin (MULTIVITAMIN) tablet Take 1 tablet by mouth daily.      . traMADol (ULTRAM) 50 MG tablet TAKE ONE TABLET BY MOUTH TWICE DAILY AS NEEDED FOR PAIN  30 tablet  0   No current facility-administered medications on file prior to visit.    Review of Systems Patient denies any headache, lightheadedness or dizziness.  No significant sinus or allergy symptoms.  No chest pain, tightness or palpitations.  No increased shortness of breath, cough or congestion.  No abdominal pain or cramping.   No BRBPR or melana.   Some constipation as outlined.  No urine change.   Blood pressure as outlined. Better.  Off spironolactone.   Neck and shoulder discomfort as outlined.  Lower back better.       Objective:   Physical Exam  Filed Vitals:   02/17/13 0802  BP: 140/80  Pulse: 70  Temp: 98.5 F (36.9 C)   Blood pressure recheck:  54/36  62 year old female in no acute distress.   HEENT:  Nares- clear.  Oropharynx - without lesions. NECK:  Supple.  Nontender.  No audible bruit.  HEART:  Appears to be regular. LUNGS:  No crackles or wheezing audible.  Respirations even and unlabored.  RADIAL PULSE:  Equal bilaterally.    BREASTS:  No nipple discharge or nipple retraction present.  Could not appreciate any distinct nodules or axillary adenopathy.  ABDOMEN:  Soft, nontender.  Bowel sounds present and normal.  No audible abdominal bruit.  GU:  Not performed.  Is s/p hysterectomy.    EXTREMITIES:  No increased edema present.  DP pulses palpable and equal bilaterally.      MSK:  Increased tenderness to palpation over the left post shoulder > right.  Increased pulling sensation and discomfort with looking from right to left (looking left worse).  Assessment & Plan:  ABDOMINAL DISCOMFORT.  S/p cholecystectomy.  Doing better.  No pain.  CARDIOVASCULAR.  Previous stress test negative for ischemia.  Continue risk factor modification.   INCREASED PSYCHOSOCIAL STRESSORS.  Doing well.  Follow.    PREVIOUS ABNORMAL MAMMOGRAM.  Mammogram 01/22/11 and 01/27/12 - Birads II.  Obtain 2014 results.  States just had in 8/14.  States ok.   LEFT ADRENAL FULLNESS.  Adrenal fullness on CT chest.  Appears to be nonfunctioning.  Recent MRI 09/23/11  - negative.    HEALTH MAINTENANCE.  Physical today.  She is s/p hysterectomy.  Mammogram - as above. Colonoscopy 04/30/12 revealed one 2mm polyp in the sigmoid colon and redundant colon.

## 2013-02-17 NOTE — Assessment & Plan Note (Signed)
Currently doing well.  Follow.  

## 2013-02-17 NOTE — Assessment & Plan Note (Signed)
Persistent.  Increased.  Refer to PT for evaluation and treatment.

## 2013-02-17 NOTE — Assessment & Plan Note (Signed)
Stable.  Currently without symptoms.

## 2013-02-27 ENCOUNTER — Telehealth: Payer: Self-pay | Admitting: Internal Medicine

## 2013-02-27 NOTE — Telephone Encounter (Signed)
Notified labs ok via my chart.

## 2013-02-28 ENCOUNTER — Encounter: Payer: Self-pay | Admitting: Internal Medicine

## 2013-02-28 DIAGNOSIS — N3090 Cystitis, unspecified without hematuria: Secondary | ICD-10-CM

## 2013-03-11 ENCOUNTER — Encounter: Payer: Self-pay | Admitting: Internal Medicine

## 2013-03-26 ENCOUNTER — Other Ambulatory Visit: Payer: Self-pay | Admitting: Internal Medicine

## 2013-04-14 ENCOUNTER — Other Ambulatory Visit: Payer: Self-pay

## 2013-06-14 ENCOUNTER — Ambulatory Visit: Payer: Self-pay | Admitting: General Practice

## 2013-06-21 ENCOUNTER — Ambulatory Visit: Payer: No Typology Code available for payment source | Admitting: Internal Medicine

## 2013-07-12 ENCOUNTER — Ambulatory Visit: Payer: Self-pay | Admitting: Urology

## 2013-08-02 DIAGNOSIS — N3090 Cystitis, unspecified without hematuria: Secondary | ICD-10-CM | POA: Insufficient documentation

## 2013-11-28 DIAGNOSIS — M7070 Other bursitis of hip, unspecified hip: Secondary | ICD-10-CM | POA: Insufficient documentation

## 2013-11-28 DIAGNOSIS — M7062 Trochanteric bursitis, left hip: Secondary | ICD-10-CM | POA: Insufficient documentation

## 2014-02-02 ENCOUNTER — Ambulatory Visit: Payer: Self-pay | Admitting: Internal Medicine

## 2014-02-03 ENCOUNTER — Encounter: Payer: Self-pay | Admitting: Internal Medicine

## 2014-02-04 ENCOUNTER — Telehealth: Payer: Self-pay | Admitting: Internal Medicine

## 2014-02-04 NOTE — Telephone Encounter (Signed)
Pt needs physical.  Please schedule.

## 2014-02-27 ENCOUNTER — Other Ambulatory Visit: Payer: Self-pay | Admitting: Internal Medicine

## 2014-02-28 ENCOUNTER — Other Ambulatory Visit: Payer: Self-pay | Admitting: Internal Medicine

## 2014-03-24 ENCOUNTER — Encounter: Payer: No Typology Code available for payment source | Admitting: Internal Medicine

## 2014-05-17 ENCOUNTER — Encounter: Payer: No Typology Code available for payment source | Admitting: Internal Medicine

## 2014-05-24 ENCOUNTER — Encounter: Payer: Self-pay | Admitting: Internal Medicine

## 2014-05-24 ENCOUNTER — Ambulatory Visit (INDEPENDENT_AMBULATORY_CARE_PROVIDER_SITE_OTHER): Payer: No Typology Code available for payment source | Admitting: Internal Medicine

## 2014-05-24 VITALS — BP 146/90 | HR 75 | Temp 97.9°F | Ht 64.5 in | Wt 184.2 lb

## 2014-05-24 DIAGNOSIS — R911 Solitary pulmonary nodule: Secondary | ICD-10-CM

## 2014-05-24 DIAGNOSIS — G8929 Other chronic pain: Secondary | ICD-10-CM

## 2014-05-24 DIAGNOSIS — M549 Dorsalgia, unspecified: Secondary | ICD-10-CM

## 2014-05-24 DIAGNOSIS — N3080 Other cystitis without hematuria: Secondary | ICD-10-CM

## 2014-05-24 DIAGNOSIS — K219 Gastro-esophageal reflux disease without esophagitis: Secondary | ICD-10-CM

## 2014-05-24 DIAGNOSIS — E669 Obesity, unspecified: Secondary | ICD-10-CM

## 2014-05-24 DIAGNOSIS — I10 Essential (primary) hypertension: Secondary | ICD-10-CM

## 2014-05-24 DIAGNOSIS — N3090 Cystitis, unspecified without hematuria: Secondary | ICD-10-CM

## 2014-05-24 LAB — LIPID PANEL
Cholesterol: 151 mg/dL (ref 0–200)
HDL: 86 mg/dL — AB (ref 35–70)
LDL Cholesterol: 56 mg/dL
Triglycerides: 45 mg/dL (ref 40–160)

## 2014-05-24 LAB — HEPATIC FUNCTION PANEL
ALT: 18 U/L (ref 7–35)
AST: 23 U/L (ref 13–35)
Alkaline Phosphatase: 62 U/L (ref 25–125)
BILIRUBIN, TOTAL: 0.5 mg/dL

## 2014-05-24 LAB — CBC AND DIFFERENTIAL
HCT: 44 % (ref 36–46)
Hemoglobin: 14.9 g/dL (ref 12.0–16.0)
Neutrophils Absolute: 2 /uL
Platelets: 205 10*3/uL (ref 150–399)
WBC: 3.6 10^3/mL

## 2014-05-24 LAB — BASIC METABOLIC PANEL
BUN: 8 mg/dL (ref 4–21)
CREATININE: 0.7 mg/dL (ref 0.5–1.1)
Glucose: 79 mg/dL
POTASSIUM: 4.6 mmol/L (ref 3.4–5.3)
SODIUM: 142 mmol/L (ref 137–147)

## 2014-05-24 LAB — TSH: TSH: 0.89 u[IU]/mL (ref 0.41–5.90)

## 2014-05-24 MED ORDER — CLOTRIMAZOLE-BETAMETHASONE 1-0.05 % EX CREA: 1.0000 "application " | TOPICAL_CREAM | Freq: Two times a day (BID) | CUTANEOUS | Status: DC

## 2014-05-24 NOTE — Progress Notes (Signed)
Subjective:    Patient ID: Dawn Vargas, female    DOB: 05/10/1951, 63 y.o.   MRN: [id number]  HPI 63 year old female with past history of chronic back pain requiring surgery 1989, hypertension and IBS.  She comes in today to follow up on these issues as well as for a complete physical exam.  She was originally hospitalized in 06/2010 for discomfort and had a heart w/up.  Was negative.  Subsequently had EGD - revealed ulcers.  Instructed to take carafate.  Did well for a while.  02/2012 - pain returned.   See previous notes for details.  Subsequently had her gall bladder removed.   She feels better now.  No abdominal pain or cramping.  Lower back is better.  She has adjusted her diet and is exercising.  walking 2-3x/week.  Power walking.  Feels better.  Hips are better.  Is s/p injections in both hips.  Saw ortho Gavin Potters([facility]).  She is able to walk better.  Also went to physical therapy.  Is doing home exercise.   Eating and drinking well.  No nausea or vomiting.  No chest pain or tightness.  No sob.   MRI (adrenal) - normal.  CT chest ok.   Bowels better.  Urinating better.  Saw Dr Evelene CroonWolff for urological w/up.  Had xrays, CT and cystoscopy.  States all ok.  Last UtI 10//28/15.  Overall feels better.  She was off of her medication for a while.  Just started back on Losartan a few days ago.     Past Medical History  Diagnosis Date  . Hypertension   . Chronic back pain     s/p surgery for herniated disc 93/89)  . Arthritis   . GERD (gastroesophageal reflux disease)   . Hypertension     Current Outpatient Prescriptions on File Prior to Visit  Medication Sig Dispense Refill  . losartan (COZAAR) 25 MG tablet TAKE ONE TABLET BY MOUTH TWICE DAILY (Patient taking differently: TAKE ONE TABLET BY MOUTH ONCE DAILY) 60 tablet 0  . Multiple Vitamin (MULTIVITAMIN) tablet Take 1 tablet by mouth daily.    . traMADol (ULTRAM) 50 MG tablet TAKE ONE TABLET BY MOUTH TWICE DAILY AS NEEDED FOR PAIN 30 tablet 0   No  current facility-administered medications on file prior to visit.    Review of Systems Patient denies any headache, lightheadedness or dizziness.  No significant sinus or allergy symptoms.  No chest pain, tightness or palpitations.  No increased shortness of breath, cough or congestion.  No abdominal pain or cramping.   No BRBPR or melana.  Bowels better.   No urine change.   Blood pressures attached.  She has been off her medication.  Back on now.. Back and hips better.  Handling stress better.       Objective:   Physical Exam  Filed Vitals:   05/24/14 0826  BP: 146/90  Pulse: 75  Temp: 97.9 F (36.6 C)   Blood pressure recheck:  53138/6084  63 year old female in no acute distress.   HEENT:  Nares- clear.  Oropharynx - without lesions. NECK:  Supple.  Nontender.  No audible bruit.  HEART:  Appears to be regular. LUNGS:  No crackles or wheezing audible.  Respirations even and unlabored.  RADIAL PULSE:  Equal bilaterally.    BREASTS:  No nipple discharge or nipple retraction present.  Could not appreciate any distinct nodules or axillary adenopathy.  ABDOMEN:  Soft, nontender.  Bowel sounds present and  normal.  No audible abdominal bruit.  GU:  Not performed.     EXTREMITIES:  No increased edema present.  DP pulses palpable and equal bilaterally.         Assessment & Plan:  1. Obesity (BMI 30-39.9) She has adjusted her diet and lost weight.  Doing well.  Continue diet and exercise.    2. Essential hypertension Blood pressure as outlined.  Back on losartan now.  Check and record blood pressures. Send in over the next few weeks.  Check metabolic panel.    3. Gastroesophageal reflux disease, esophagitis presence not specified Controlled.    4. Recurrent cystitis No uti since 04/05/14.  Saw Dr Evelene CroonWolff.  W/up as outlined.    5. Lung nodule CT per pt - ok.    6. Chronic back pain Better.    7.  ABDOMINAL DISCOMFORT.  S/p cholecystectomy.  Doing better.  No pain.  8.   CARDIOVASCULAR.  Previous stress test negative for ischemia.  Continue risk factor modification.   9.  INCREASED PSYCHOSOCIAL STRESSORS.  Doing well.  Follow.    10. PREVIOUS ABNORMAL MAMMOGRAM.  Mammogram 02/02/14 - Birads I.    11. LEFT ADRENAL FULLNESS.  Adrenal fullness on CT chest.  Appears to be nonfunctioning.  Recent MRI 09/23/11  - negative.    HEALTH MAINTENANCE.  Physical today.  She is s/p hysterectomy.  Mammogram - as above. Colonoscopy 04/30/12 revealed one 2mm polyp in the sigmoid colon and redundant colon.

## 2014-05-24 NOTE — Progress Notes (Signed)
Pre visit review using our clinic review tool, if applicable. No additional management support is needed unless otherwise documented below in the visit note. 

## 2014-05-25 ENCOUNTER — Other Ambulatory Visit: Payer: Self-pay | Admitting: *Deleted

## 2014-05-25 MED ORDER — LOSARTAN POTASSIUM 25 MG PO TABS: 25.0000 mg | ORAL_TABLET | Freq: Two times a day (BID) | ORAL | Status: DC

## 2014-05-26 ENCOUNTER — Encounter: Payer: Self-pay | Admitting: *Deleted

## 2014-05-28 ENCOUNTER — Encounter: Payer: Self-pay | Admitting: Internal Medicine

## 2014-05-28 DIAGNOSIS — E669 Obesity, unspecified: Secondary | ICD-10-CM | POA: Insufficient documentation

## 2014-06-11 ENCOUNTER — Telehealth: Payer: Self-pay | Admitting: Internal Medicine

## 2014-06-11 NOTE — Telephone Encounter (Signed)
Pt notified of lab results via mychart. 

## 2014-06-13 NOTE — Telephone Encounter (Signed)
Unread mychart message mailed to patient 

## 2014-06-17 ENCOUNTER — Encounter: Payer: Self-pay | Admitting: Internal Medicine

## 2014-06-21 NOTE — Telephone Encounter (Signed)
Unread mychart message mailed to patient 

## 2014-08-23 IMAGING — CR DG ABDOMEN 1V
1 series · 2 of 2 positions shown · non-contrast
Comparison: CT 06/15/2009

CLINICAL DATA: Kidney and bladder pain for 1 week.

EXAM:
ABDOMEN - 1 VIEW

[Series 1: supine kub · 0.17mm/px · 2 of 2 slices shown]
[im 1/2]
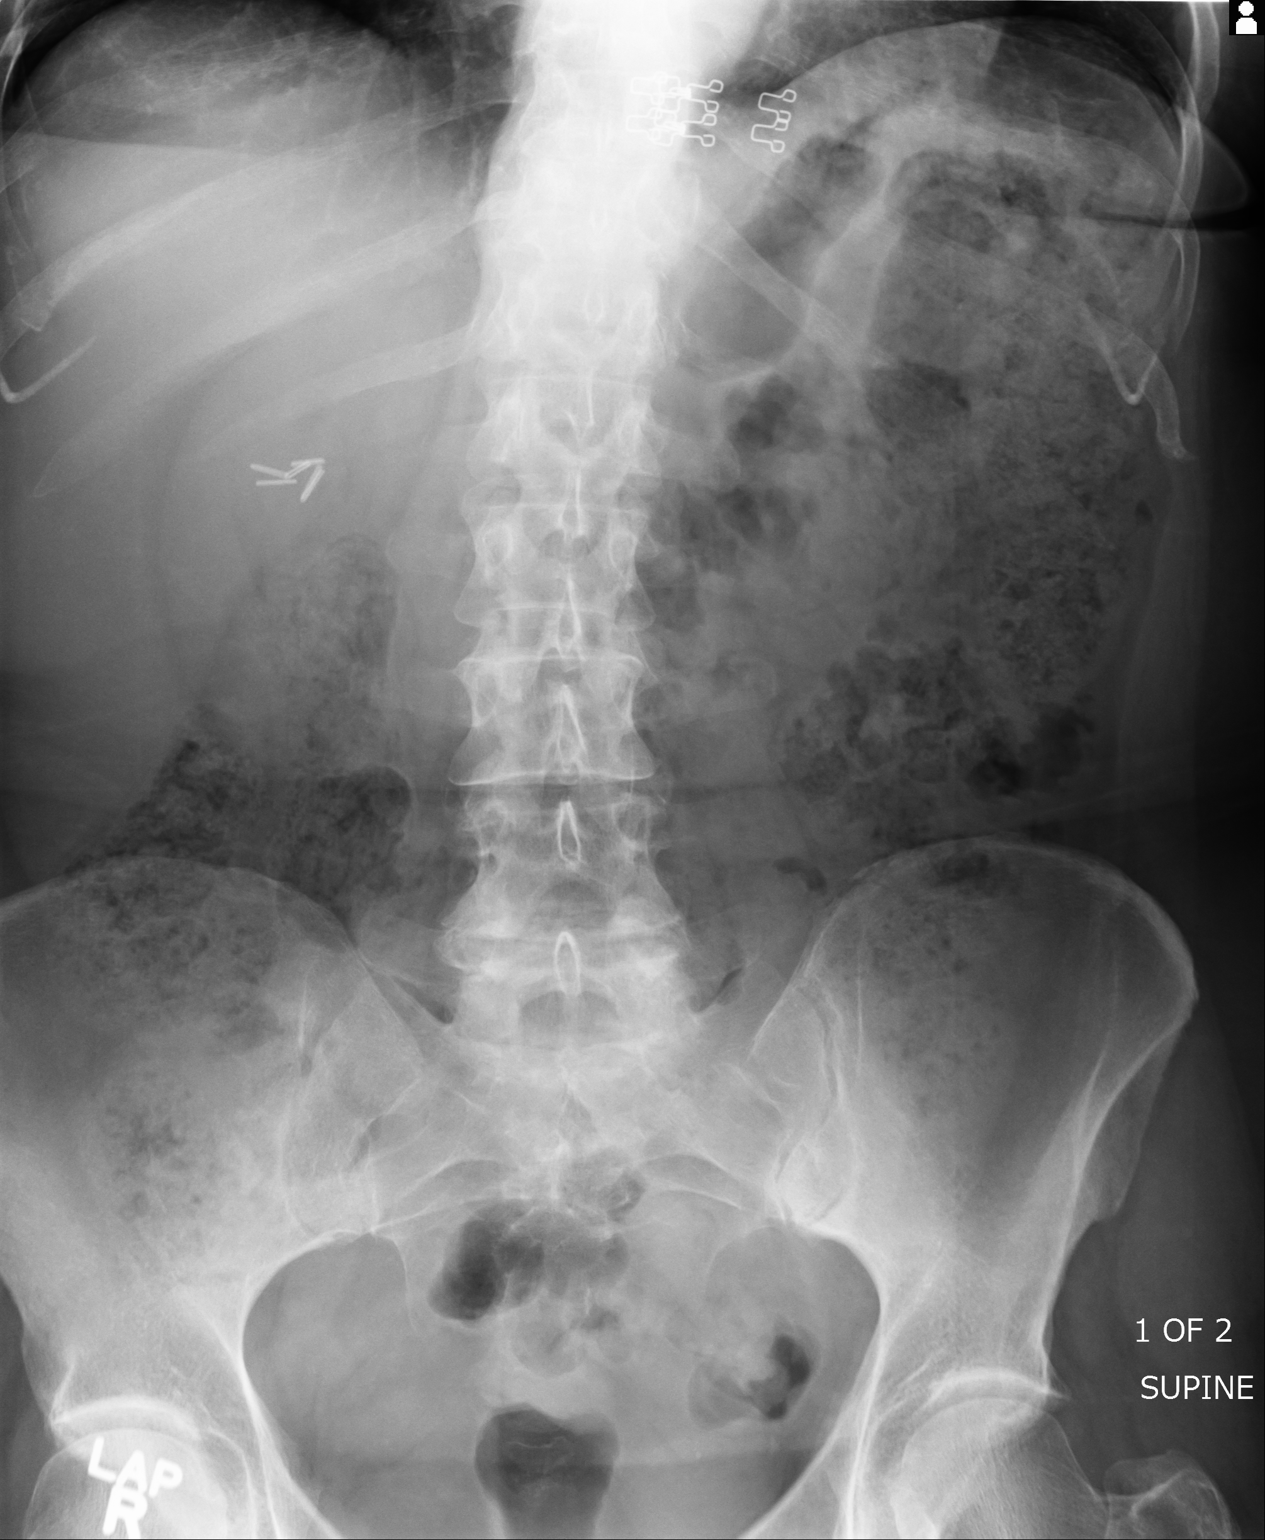
[im 2/2]
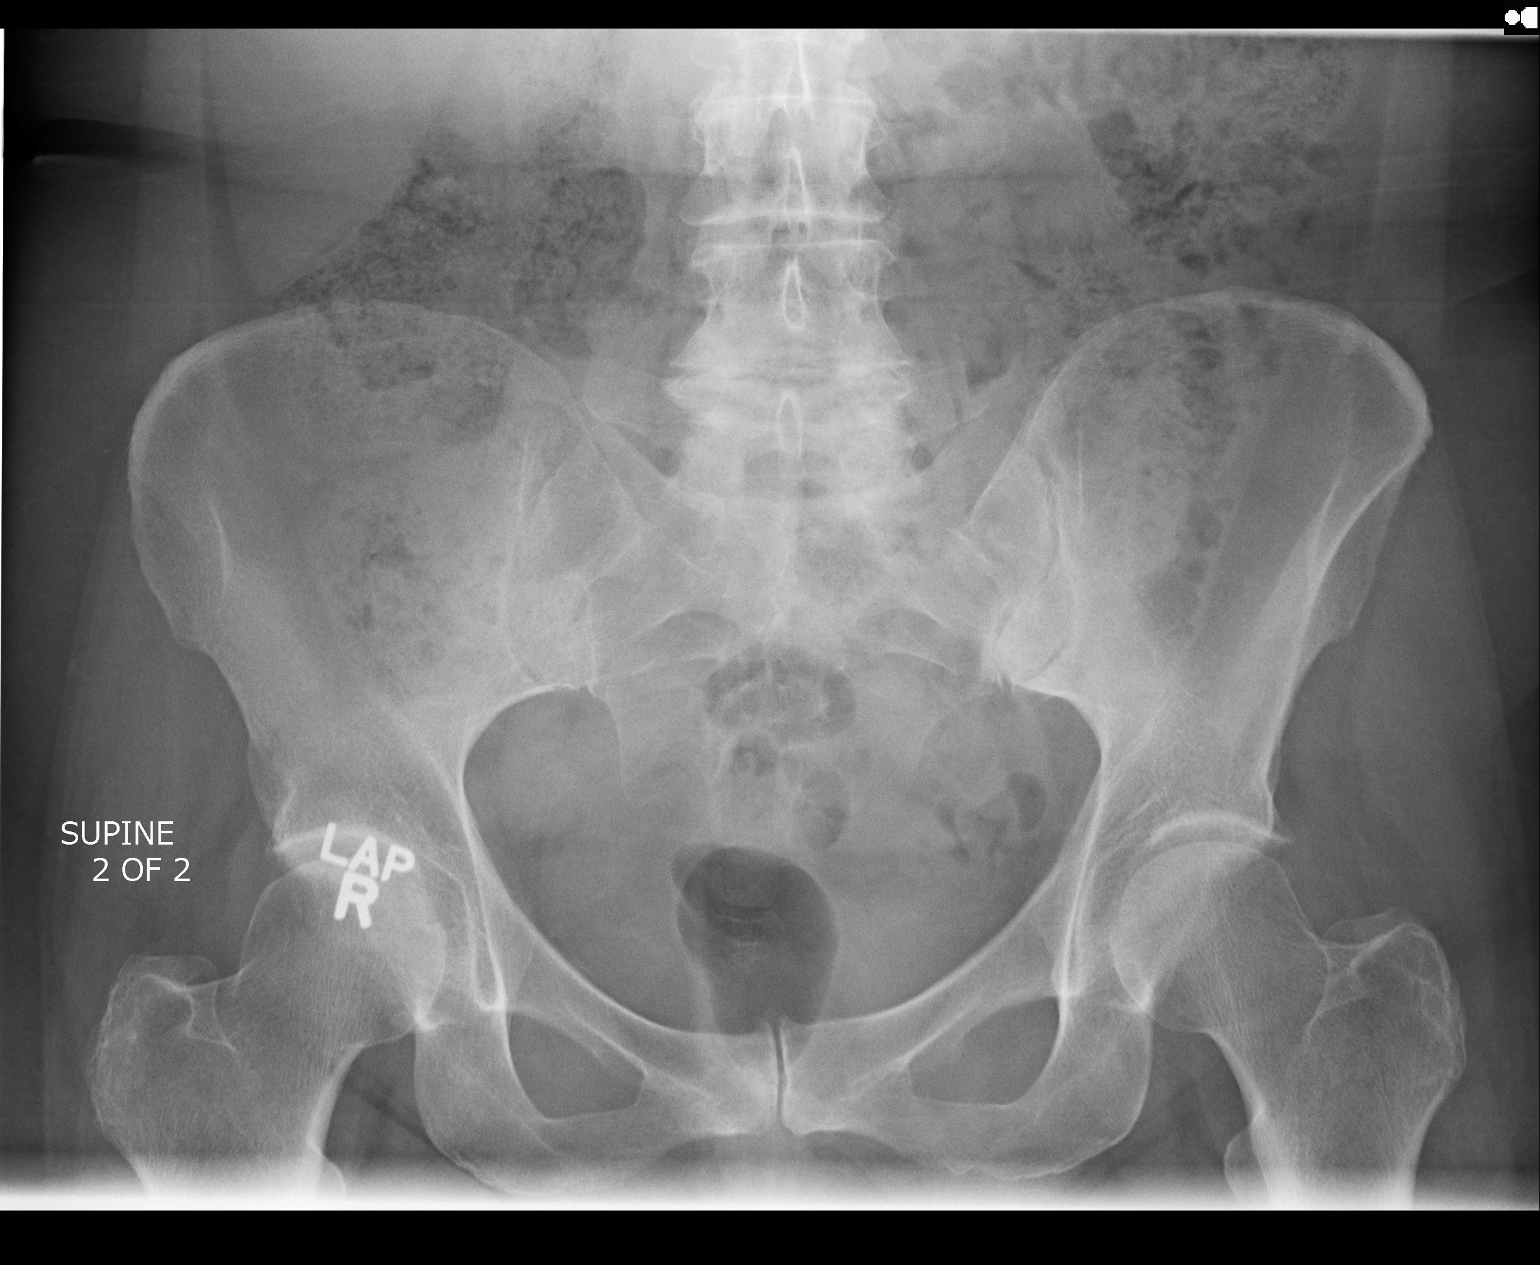

[2 of 2 positions shown; findings below may reference images not displayed]

FINDINGS: Stool is present throughout the colon. Nonobstructed bowel gas
pattern. Cholecystectomy clips. Lower lumbar spine degenerative
change.
IMPRESSION: Stool throughout the colon as can be seen with constipation.

## 2014-09-04 ENCOUNTER — Encounter: Payer: Self-pay | Admitting: Family Medicine

## 2014-09-04 ENCOUNTER — Ambulatory Visit (INDEPENDENT_AMBULATORY_CARE_PROVIDER_SITE_OTHER): Payer: PRIVATE HEALTH INSURANCE | Admitting: Family Medicine

## 2014-09-04 ENCOUNTER — Other Ambulatory Visit (INDEPENDENT_AMBULATORY_CARE_PROVIDER_SITE_OTHER): Payer: PRIVATE HEALTH INSURANCE

## 2014-09-04 VITALS — BP 140/92 | HR 66 | Ht 65.0 in | Wt 192.0 lb

## 2014-09-04 DIAGNOSIS — M25512 Pain in left shoulder: Secondary | ICD-10-CM

## 2014-09-04 DIAGNOSIS — M75102 Unspecified rotator cuff tear or rupture of left shoulder, not specified as traumatic: Secondary | ICD-10-CM

## 2014-09-04 NOTE — Patient Instructions (Signed)
Good to see you.  Ice 20 minutes 2 times daily. Usually after activity and before bed. Exercises 3 times a week.  Turmeric 500mg  twice daily Vitamin D 2000 IU daily  See me in 2-3 weeks in case you need injection in shoulder or hip

## 2014-09-04 NOTE — Progress Notes (Signed)
Pre visit review using our clinic review tool, if applicable. No additional management support is needed unless otherwise documented below in the visit note. 

## 2014-09-04 NOTE — Progress Notes (Signed)
Tawana ScaleZach Smith D.O. [facility] [address] [place], [place] [address] Phone: [phone] Subjective:    I'm seeing this patient by the request  of:  [name], [name], MD   CC: Shoulder pain left  BJY:[id number]:Subjective Sheral Flowdwina Marlou StarksJean Inocencio is a 64 y.o. female coming in with complaint of shoulder pain left side. Patient remembers in December she was attempting to throw out back a trash and when she extended her arm she had a sharp pain in the shoulder. Seems to radiate down her arm. Patient may have had some weakness and was compensating not using this arm for long amount of time. Patient states that the weakness that she had a immediately seemed to resolve after weeks. Patient though unfortunately continues to have pain with certain motions and can have a dull throbbing pain at night that can keep her up. Patient has tried over-the-counter medications with minimal benefit. Patient rates the severity of pain a 7 out of 10. Patient denies any associated neck pain. Denies any fevers, chills, or any abnormal weight loss.     Past medical history, social, surgical and family history all reviewed in electronic medical record.  Past Medical History  Diagnosis Date  . Hypertension   . Chronic back pain     s/p surgery for herniated disc 93/89)  . Arthritis   . GERD (gastroesophageal reflux disease)   . Hypertension    Past Surgical History  Procedure Laterality Date  . Vaginal hysterectomy  1999    laparoscopic assisted  . Cholecystectomy  August 25 2012   History   Social History  . Marital Status: Single    Spouse Name: N/A  . Number of Children: N/A  . Years of Education: N/A   Occupational History  . Not on file.   Social History Main Topics  . Smoking status: Former Games developermoker  . Smokeless tobacco: Never Used  . Alcohol Use: No  . Drug Use: No  . Sexual Activity: Not on file   Other Topics Concern  . Not on file   Social History Narrative   Family History  Problem  Relation Age of Onset  . Dementia Father   . Prostate cancer Father   . Heart disease Mother     myocardial infarction, congestive heart failure  . Hypertension Mother   . Heart disease Brother     angioplasty  . Hypertension Brother   . Hypertension Sister   . Breast cancer Neg Hx   . Colon cancer Neg Hx      Review of Systems: No headache, visual changes, nausea, vomiting, diarrhea, constipation, dizziness, abdominal pain, skin rash, fevers, chills, night sweats, weight loss, swollen lymph nodes, body aches, joint swelling, muscle aches, chest pain, shortness of breath, mood changes.   Objective Blood pressure 140/92, pulse 66, height 5\' 5"  (1.651 m), weight 192 lb (87.091 kg), last menstrual period 05/21/1998, SpO2 96 %.  General: No apparent distress alert and oriented x3 mood and affect normal, dressed appropriately.  HEENT: Pupils equal, extraocular movements intact  Respiratory: Patient's speak in full sentences and does not appear short of breath  Cardiovascular: No lower extremity edema, non tender, no erythema  Skin: Warm dry intact with no signs of infection or rash on extremities or on axial skeleton.  Abdomen: Soft nontender  Neuro: Cranial nerves II through XII are intact, neurovascularly intact in all extremities with 2+ DTRs and 2+ pulses.  Lymph: No lymphadenopathy of posterior or anterior cervical chain or axillae bilaterally.  Gait normal with good balance and coordination.  MSK:  Non tender with full range of motion and good stability and symmetric strength and tone of  elbows, wrist, hip, knee and ankles bilaterally.  Shoulder: left Inspection reveals no abnormalities, atrophy or asymmetry. Palpation is normal with no tenderness over AC joint or bicipital groove. Range of motion shows lacking the last 5 of external rotation Rotator cuff strength normal throughout. Mild fatigue compared to contralateral side signs of impingement with positive Neer and Hawkin's  tests, but negative empty can sign. Speeds and Yergason's tests normal. No labral pathology noted with negative Obrien's, negative clunk and good stability. Normal scapular function observed. No painful arc and no drop arm sign. No apprehension sign Contralateral side unremarkable  MSK US performed of: left This study was ordered, performed, and interpreted by Terrilee Files D.O.  Shoulder:   Supraspinatus:  Appears normal on long and transverse views, Bursal bulge seen with shoulder abduction on impingement view. Infraspinatus:  Full-thickness tear with 0.3 cm of retraction Subscapularis:  Appears normal on long and transverse views. Positive bursa Teres Minor:  Appears normal on long and transverse views. AC joint:  Mild arthritis Glenohumeral Joint:  Appears normal without effusion. Glenoid Labrum:  Intact without visualized tears. Biceps Tendon:  Appears normal on long and transverse views, no fraying of tendon, tendon located in intertubercular groove, no subluxation with shoulder internal or external rotation.  Impression: Subacromial bursitis with degenerative changes of the rotator cuff especially the supraspinatus and subscapularis with continued tear of the infraspinatus   Procedure note 97110; 15 minutes spent for Therapeutic exercises as stated in above notes.  This included exercises focusing on stretching, strengthening, with significant focus on eccentric aspects.  Shoulder Exercises that included:  Basic scapular stabilization to include adduction and depression of scapula Scaption, focusing on proper movement and good control Internal and External rotation utilizing a theraband, with elbow tucked at side entire time Rows with theraband Proper technique shown and discussed handout in great detail with ATC.  All questions were discussed and answered.      Impression and Recommendations:     This case required medical decision making of moderate complexity.

## 2014-09-04 NOTE — Assessment & Plan Note (Signed)
Patient does have a rotator cuff tear mostly of the infraspinatus and degenerative changes of the subscapularis and supraspinatus but patient does have interval healing and appears her scar tissue. I do think patient can do well with conservative therapy. We discussed icing regimen, home exercises with athletic trainer today, as well as positional changes. Patient will see if this makes any improvement. Patient will come back in 2-3 weeks and if any pain still remaining we may need to consider intra-articular injection.

## 2014-09-20 ENCOUNTER — Ambulatory Visit (INDEPENDENT_AMBULATORY_CARE_PROVIDER_SITE_OTHER): Payer: PRIVATE HEALTH INSURANCE | Admitting: Family Medicine

## 2014-09-20 ENCOUNTER — Encounter: Payer: Self-pay | Admitting: Family Medicine

## 2014-09-20 VITALS — BP 142/92 | HR 75 | Ht 65.0 in | Wt 189.0 lb

## 2014-09-20 DIAGNOSIS — M7070 Other bursitis of hip, unspecified hip: Secondary | ICD-10-CM

## 2014-09-20 DIAGNOSIS — M75102 Unspecified rotator cuff tear or rupture of left shoulder, not specified as traumatic: Secondary | ICD-10-CM

## 2014-09-20 DIAGNOSIS — M7072 Other bursitis of hip, left hip: Secondary | ICD-10-CM | POA: Diagnosis not present

## 2014-09-20 DIAGNOSIS — M7071 Other bursitis of hip, right hip: Secondary | ICD-10-CM | POA: Diagnosis not present

## 2014-09-20 NOTE — Progress Notes (Signed)
[name] [address] [place], [place] [address] Phone: [phone] Subjective:    I'm seeing this patient by the request  of:  Dale Romoland, MD   CC: Shoulder pain left  [id number] Dawn Vargas is a 64 y.o. female coming in with complaint of shoulder pain left side. Patient was seen previously and was diagnosed with a degenerative rotator cuff tear. Patient did show some healing on the ultrasound had previous exam. Patient elected try conservative therapy including home exercises, icing protocol, we discussed over-the-counter medications. Patient states the pain she was having in the shoulder is approximately 20% better. Patient states that she has noticed some increase in strength. Patient has not been doing the exercises because it didn't hurt her when she did these. Patient is concerned and would like more hands on treatment for this. Patient still wants to avoid an injection at this point but is wondering what all she can do. Denies that it is waking her up at night.  Patient is also complaining of bilateral hip pain patient states that this is waking her up at night. Worse when she rolls over on each side or sitting for long amount of time and then standing. Patient states that after she is moving it seems to be better. Denies any radiation down the legs any numbness or tingling. Patient states that the severity pain is 8 out of 10 at this time because she is no longer sleeping well. No injury.     Past medical history, social, surgical and family history all reviewed in electronic medical record.  Past Medical History  Diagnosis Date  . Hypertension   . Chronic back pain     s/p surgery for herniated disc 93/89)  . Arthritis   . GERD (gastroesophageal reflux disease)   . Hypertension    Past Surgical History  Procedure Laterality Date  . Vaginal hysterectomy  1999    laparoscopic assisted  . Cholecystectomy  August 25 2012    History   Social History  . Marital Status: Single    Spouse Name: N/A  . Number of Children: N/A  . Years of Education: N/A   Occupational History  . Not on file.   Social History Main Topics  . Smoking status: Former Games developer  . Smokeless tobacco: Never Used  . Alcohol Use: No  . Drug Use: No  . Sexual Activity: Not on file   Other Topics Concern  . Not on file   Social History Narrative   Family History  Problem Relation Age of Onset  . Dementia Father   . Prostate cancer Father   . Heart disease Mother     myocardial infarction, congestive heart failure  . Hypertension Mother   . Heart disease Brother     angioplasty  . Hypertension Brother   . Hypertension Sister   . Breast cancer Neg Hx   . Colon cancer Neg Hx      Review of Systems: No headache, visual changes, nausea, vomiting, diarrhea, constipation, dizziness, abdominal pain, skin rash, fevers, chills, night sweats, weight loss, swollen lymph nodes, body aches, joint swelling, muscle aches, chest pain, shortness of breath, mood changes.   Objective Blood pressure 142/92, weight 189 lb (85.73 kg), last menstrual period 05/21/1998.  General: No apparent distress alert and oriented x3 mood and affect normal, dressed appropriately.  HEENT: Pupils equal, extraocular movements intact  Respiratory: Patient's speak in full sentences and does not appear short of breath  Cardiovascular: No lower extremity edema, non tender, no erythema  Skin: Warm dry intact with no signs of infection or rash on extremities or on axial skeleton.  Abdomen: Soft nontender  Neuro: Cranial nerves II through XII are intact, neurovascularly intact in all extremities with 2+ DTRs and 2+ pulses.  Lymph: No lymphadenopathy of posterior or anterior cervical chain or axillae bilaterally.  Gait normal with good balance and coordination.  MSK:  Non tender with full range of motion and good stability and symmetric strength and tone of  elbows,  wrist,  knee and ankles bilaterally.  Shoulder: left Inspection reveals no abnormalities, atrophy or asymmetry. Palpation is normal with no tenderness over AC joint or bicipital groove. Range of motion full improvement from previous exam Rotator cuff strength normal throughout. signs of impingement with positive Neer and Hawkin's tests, but negative empty can sign. Speeds and Yergason's tests normal. No labral pathology noted with negative Obrien's, negative clunk and good stability. Normal scapular function observed. No painful arc and no drop arm sign. No apprehension sign Contralateral side unremarkable  Hip: Bilateral ROM IR: 25 Deg, ER: 45 Deg, Flexion: 120 Deg, Extension: 100 Deg, Abduction: 45 Deg, Adduction: 45 Deg Strength IR: 5/5, ER: 5/5, Flexion: 5/5, Extension: 5/5, Abduction: 4/5, Adduction: 5/5 Pelvic alignment unremarkable to inspection and palpation. Standing hip rotation and gait without trendelenburg sign / unsteadiness. Greater trochanter severe tenderness to palpation bilaterally No tenderness over piriformis Positive Faber bilaterally No SI joint tenderness and normal minimal SI movement.  MSK US performed of: Bilateral This study was ordered, performed, and interpreted by Terrilee Files D.O.  Hip: Trochanteric bursa with significant hypoechoic changes and swelling Acetabular labrum visualized and without tears, displacement, or effusion in joint. Femoral neck appears unremarkable without increased power doppler signal along Cortex.  IMPRESSION:  Greater trochanter bursitis   Procedure: Real-time Ultrasound Guided Injection of right greater trochanteric bursitis secondary to patient's body habitus Device: GE Logiq E  Ultrasound guided injection is preferred based studies that show increased duration, increased effect, greater accuracy, decreased procedural pain, increased response rate, and decreased cost with ultrasound guided versus blind injection.  Verbal  informed consent obtained.  Time-out conducted.  Noted no overlying erythema, induration, or other signs of local infection.  Skin prepped in a sterile fashion.  Local anesthesia: Topical Ethyl chloride.  With sterile technique and under real time ultrasound guidance:  Greater trochanteric area was visualized and patient's bursa was noted. A 22-gauge 3 inch needle was inserted and 4 cc of 0.5% Marcaine and 1 cc of Kenalog 40 mg/dL was injected. Pictures taken Completed without difficulty  Pain immediately resolved suggesting accurate placement of the medication.  Advised to call if fevers/chills, erythema, induration, drainage, or persistent bleeding.  Images permanently stored and available for review in the ultrasound unit.  Impression: Technically successful ultrasound guided injection.    Procedure: Real-time Ultrasound Guided Injection of left  greater trochanteric bursitis secondary to patient's body habitus Device: GE Logiq E  Ultrasound guided injection is preferred based studies that show increased duration, increased effect, greater accuracy, decreased procedural pain, increased response rate, and decreased cost with ultrasound guided versus blind injection.  Verbal informed consent obtained.  Time-out conducted.  Noted no overlying erythema, induration, or other signs of local infection.  Skin prepped in a sterile fashion.  Local anesthesia: Topical Ethyl chloride.  With sterile technique and under real time ultrasound guidance:  Greater trochanteric area was visualized and patient's bursa was noted. A 22-gauge 3  inch needle was inserted and 4 cc of 0.5% Marcaine and 1 cc of Kenalog 40 mg/dL was injected. Pictures taken Completed without difficulty  Pain immediately resolved suggesting accurate placement of the medication.  Advised to call if fevers/chills, erythema, induration, drainage, or persistent bleeding.  Images permanently stored and available for review in the ultrasound  unit.  Impression: Technically successful ultrasound guided injection.       Impression and Recommendations:     This case required medical decision making of moderate complexity.

## 2014-09-20 NOTE — Assessment & Plan Note (Signed)
She was given bilateral injections. Patient tolerated the procedure well as well as we'll do more of a icing protocol. We discussed the importance of hip abductor strengthening as well. Patient will try to do these exercises on a regular basis as well as try the topical anti-inflammatories. Patient come back in 3-4 weeks. Continuing to have difficulty we may need to consider imaging of the low back. I think this would be unlikely. I also need to consider custom orthotics.

## 2014-09-20 NOTE — Progress Notes (Signed)
Pre visit review using our clinic review tool, if applicable. No additional management support is needed unless otherwise documented below in the visit note. 

## 2014-09-20 NOTE — Assessment & Plan Note (Signed)
Patient has improved some of the range of motion as well as some of the strength. Patient seems to be doing well with the conservative therapy but will be sent to formal physical therapy for more hands-on approach. We discussed icing regimen as well as topical medicine. Patient come back after physical therapy.

## 2014-09-20 NOTE — Patient Instructions (Signed)
Good to see you Ice in 6 hours to the hip We will get you into physical therapy Come back in 2-3 weeks to make sure you are doing better and will consider the injection the shoulder Can take 12 weeks to heal.

## 2014-09-21 ENCOUNTER — Ambulatory Visit: Payer: PRIVATE HEALTH INSURANCE | Admitting: Family Medicine

## 2014-09-27 ENCOUNTER — Ambulatory Visit (INDEPENDENT_AMBULATORY_CARE_PROVIDER_SITE_OTHER): Payer: PRIVATE HEALTH INSURANCE | Admitting: Internal Medicine

## 2014-09-27 ENCOUNTER — Encounter: Payer: Self-pay | Admitting: Internal Medicine

## 2014-09-27 VITALS — BP 140/90 | HR 59 | Temp 98.0°F | Ht 65.0 in | Wt 187.2 lb

## 2014-09-27 DIAGNOSIS — K219 Gastro-esophageal reflux disease without esophagitis: Secondary | ICD-10-CM

## 2014-09-27 DIAGNOSIS — N3080 Other cystitis without hematuria: Secondary | ICD-10-CM

## 2014-09-27 DIAGNOSIS — E669 Obesity, unspecified: Secondary | ICD-10-CM

## 2014-09-27 DIAGNOSIS — I10 Essential (primary) hypertension: Secondary | ICD-10-CM | POA: Diagnosis not present

## 2014-09-27 DIAGNOSIS — M7070 Other bursitis of hip, unspecified hip: Secondary | ICD-10-CM

## 2014-09-27 DIAGNOSIS — N3090 Cystitis, unspecified without hematuria: Secondary | ICD-10-CM

## 2014-09-27 DIAGNOSIS — M75102 Unspecified rotator cuff tear or rupture of left shoulder, not specified as traumatic: Secondary | ICD-10-CM

## 2014-09-27 DIAGNOSIS — Z0000 Encounter for general adult medical examination without abnormal findings: Secondary | ICD-10-CM | POA: Insufficient documentation

## 2014-09-27 NOTE — Assessment & Plan Note (Signed)
Doing better s/p injection.  Follow.

## 2014-09-27 NOTE — Assessment & Plan Note (Signed)
Reflux symptoms controlled.  Follow.

## 2014-09-27 NOTE — Assessment & Plan Note (Signed)
Physical  - 05/24/14.  Mammogram 02/02/14 - Birads I.  Colonoscopy 04/30/12 - 2mm polyp.

## 2014-09-27 NOTE — Assessment & Plan Note (Signed)
Worked up by urology.  Doing ok now.  Follow.

## 2014-09-27 NOTE — Progress Notes (Signed)
Patient ID: Dawn Vargas, female   DOB: 09-15-1950, 64 y.o.   MRN: [id number]   Subjective:    Patient ID: Dawn Vargas, female    DOB: [birth year], 64 y.o.   MRN: [id number]  HPI  Patient here for a scheduled follow up.  Previous shoulder injury.  Saw Dr Katrinka Blazing.  Doing better.  Increased rom.  Doing exercise now. Just evaluated and found to have bursitis of left hip.  S/p injection.  Doing well.  Exercising.  No cardiac symptoms with increased activity or exertion.  Breathing stable.  Bowels stable.  Has been checking her blood pressures.  Blood pressures averaging - 120/130s/70-80s.  Overall doing better.     Past Medical History  Diagnosis Date  . Hypertension   . Chronic back pain     s/p surgery for herniated disc 93/89)  . Arthritis   . GERD (gastroesophageal reflux disease)   . Hypertension     Current Outpatient Prescriptions on File Prior to Visit  Medication Sig Dispense Refill  . B Complex-C (SUPER B COMPLEX PO) Take 1 tablet by mouth daily.    . Glucosamine-Chondroitin-MSM-D (TRIPLE FLEX/VITAMIN D3) TABS Take 1 tablet by mouth daily.    Marland Kitchen losartan (COZAAR) 25 MG tablet Take 1 tablet (25 mg total) by mouth 2 (two) times daily. 60 tablet 5  . Multiple Vitamin (MULTIVITAMIN) tablet Take 1 tablet by mouth daily.    Marland Kitchen OVER THE COUNTER MEDICATION Tumeric  1 daily.     No current facility-administered medications on file prior to visit.    Review of Systems  Constitutional: Negative for appetite change and unexpected weight change.  HENT: Negative for congestion and sinus pressure.   Respiratory: Negative for cough, chest tightness and shortness of breath.   Cardiovascular: Negative for chest pain, palpitations and leg swelling.  Gastrointestinal: Negative for nausea, vomiting, abdominal pain and diarrhea.  Neurological: Negative for dizziness, light-headedness and headaches.  Psychiatric/Behavioral: Negative for dysphoric mood and agitation.       Objective:      Blood pressure recheck:  138-140/84  Physical Exam  Constitutional: She appears well-developed and well-nourished. No distress.  HENT:  Nose: Nose normal.  Mouth/Throat: Oropharynx is clear and moist.  Neck: Neck supple. No thyromegaly present.  Cardiovascular: Normal rate and regular rhythm.   Pulmonary/Chest: Breath sounds normal. No respiratory distress. She has no wheezes.  Abdominal: Soft. Bowel sounds are normal. There is no tenderness.  Musculoskeletal: She exhibits no edema or tenderness.  Lymphadenopathy:    She has no cervical adenopathy.    BP 140/90 mmHg  Pulse 59  Temp(Src) 98 F (36.7 C) (Oral)  Ht  (1.651 m)  Wt 187 lb 4 oz (84.936 kg)  BMI 31.16 kg/m2  SpO2 99%  LMP 05/21/1998 Wt Readings from Last 3 Encounters:  09/27/14 187 lb 4 oz (84.936 kg)  09/20/14 189 lb (85.73 kg)  09/04/14 192 lb (87.091 kg)     Lab Results  Component Value Date   WBC 3.6 05/24/2014   HGB 14.9 05/24/2014   HCT 44 05/24/2014   PLT 205 05/24/2014   CHOL 151 05/24/2014   TRIG 45 05/24/2014   HDL 86* 05/24/2014   LDLCALC 56 05/24/2014   ALT 18 05/24/2014   AST 23 05/24/2014   NA 142 05/24/2014   K 4.6 05/24/2014   CREATININE 0.7 05/24/2014   BUN 8 05/24/2014   TSH 0.89 05/24/2014       Assessment & Plan:  Problem List Items Addressed This Visit    Bursitis of hip    Doing better s/p injection.  Follow.        GERD (gastroesophageal reflux disease)    Reflux symptoms controlled.  Follow.        Health care maintenance    Physical  - 05/24/14.  Mammogram 02/02/14 - Birads I.  Colonoscopy 04/30/12 - 2mm polyp.        Hypertension - Primary    Blood pressure on outside checks as outlined.  Appears to be doing ok.  Same medication regimen.  Follow pressures.  Follow metabolic panel.  Send in readings over the next few weeks.        Left rotator cuff tear    Shoulder doing better.  Continue exercises.  Follow.        Obesity (BMI 30-39.9)    Diet  and exercise.        Recurrent cystitis    Worked up by urology.  Doing ok now.  Follow.          I spent 25 minutes with the patient and more than 50% of the time was spent in consultation regarding the above.     [name], Pau Banh, MD

## 2014-09-27 NOTE — Assessment & Plan Note (Signed)
Shoulder doing better.  Continue exercises.  Follow.

## 2014-09-27 NOTE — Assessment & Plan Note (Signed)
Diet and exercise.   

## 2014-09-27 NOTE — Progress Notes (Signed)
Pre visit review using our clinic review tool, if applicable. No additional management support is needed unless otherwise documented below in the visit note. 

## 2014-09-27 NOTE — Assessment & Plan Note (Signed)
Blood pressure on outside checks as outlined.  Appears to be doing ok.  Same medication regimen.  Follow pressures.  Follow metabolic panel.  Send in readings over the next few weeks.

## 2014-09-29 LAB — BASIC METABOLIC PANEL
BUN: 15 mg/dL (ref 4–21)
CREATININE: 0.6 mg/dL (ref 0.5–1.1)
Glucose: 93 mg/dL
POTASSIUM: 3.7 mmol/L (ref 3.4–5.3)
SODIUM: 137 mmol/L (ref 137–147)

## 2014-09-29 LAB — LIPID PANEL
Cholesterol: 176 mg/dL (ref 0–200)
HDL: 87 mg/dL — AB (ref 35–70)
LDL CALC: 76 mg/dL
Triglycerides: 65 mg/dL (ref 40–160)

## 2014-09-29 LAB — CBC AND DIFFERENTIAL
HCT: 43 % (ref 36–46)
HEMOGLOBIN: 14.5 g/dL (ref 12.0–16.0)
Neutrophils Absolute: 6 /uL
PLATELETS: 287 10*3/uL (ref 150–399)
WBC: 8.2 10*3/mL

## 2014-09-29 LAB — HEPATIC FUNCTION PANEL
ALK PHOS: 77 U/L (ref 25–125)
ALT: 20 U/L (ref 7–35)
AST: 18 U/L (ref 13–35)
Bilirubin, Total: 0.4 mg/dL

## 2014-09-29 LAB — TSH: TSH: 0.82 u[IU]/mL (ref 0.41–5.90)

## 2014-09-29 NOTE — Op Note (Signed)
PATIENT NAME:  Dawn Vargas, Dawn Vargas MR#:  [id number] DATE OF BIRTH:  1950/11/12  DATE OF PROCEDURE:  08/25/2012  PREOPERATIVE DIAGNOSIS:  Chronic cholecystitis.   POSTOPERATIVE DIAGNOSIS:  Chronic cholecystitis.   PROCEDURE:  Laparoscopic cholecystectomy and intraoperative cholangiograms.   SURGEON:  Earline MayotteJeffrey W. Emanuelle Bastos, MD  ANESTHESIA:  General endotracheal under Dr. Noralyn Pickarroll.   ESTIMATED BLOOD LOSS:  Less than 5 mL.   FLUID REPLACEMENT:  Crystalloid.   CLINICAL NOTE:  This 64 year old woman has a 1-year history of intermittent right upper quadrant pain. While the ultrasound was negative, HIDA scanning showed decreased ejection fraction and reproduction of her symptoms. She was felt to be a candidate for elective cholecystectomy.   OPERATIVE NOTE:  With the patient under adequate general endotracheal anesthesia, the abdomen was prepped with ChloraPrep and draped. In Trendelenburg position, a Veress needle was placed through a transumbilical incision. After assuring intraabdominal location with the hanging drop test, the abdomen was insufflated with CO2 at 10 mmHg pressure. A 10 mm step port was expanded and inspection showed no evidence of injury from initial port placement. The patient was placed into reverse Trendelenburg position and an 11 mm Xcel port was placed in the epigastric site. The lateral ports were placed under direct vision. In the lower aspect of the abdominal wall, two 5 mm step ports were utilized. The gallbladder was placed on cephalad traction. The left lobe of the liver was somewhat floppy and for that reason, the patient was returned to the supine position and rolled steeply to the left to improve the exposure of the neck of the gallbladder. Hemostasis was with electrocautery. The neck of the gallbladder was cleared and the cystic duct identified. Fluoroscopic cholangiograms were completed using 16 mL of one-half strength Conray 60. This showed prompt filling of the right and left  hepatic ducts and free flow into the duodenum. The cystic duct and branches of the cystic artery were doubly clipped and divided. The gallbladder was then removed from the liver bed making use of hook cautery dissection. It was delivered through the epigastric port site without incident. Inspection from the epigastric site showed no evidence of injury from initial port placement. The right upper quadrant was irrigated and a small amount of bleeding from the adherent omentum which had been at the neck of the gallbladder was controlled with electrocautery. The right upper quadrant was irrigated with lactated Ringer's solution. Final hemostasis was good. The abdomen was desufflated and ports removed under direct vision.   Skin incisions were closed with 4-0 Vicryl subcuticular sutures. Benzoin, Steri-Strips, Telfa and Tegaderm dressings were applied.   The patient tolerated the procedure well and was taken to the recovery room in stable condition.    ____________________________ Earline MayotteJeffrey W. Anushka Hartinger, MD jwb:si D: 08/25/2012 14:58:09 ET T: 08/25/2012 15:23:32 ET JOB#: [id number]  cc: Earline MayotteJeffrey W. Niko Penson, MD, <Dictator> Dale Durhamharlene Scott, MD Saifan Rayford Brion AlimentW Elma Limas MD ELECTRONICALLY SIGNED 08/26/2012 9:28

## 2014-10-02 ENCOUNTER — Encounter: Payer: Self-pay | Admitting: Internal Medicine

## 2014-10-04 ENCOUNTER — Telehealth: Payer: Self-pay | Admitting: Internal Medicine

## 2014-10-04 NOTE — Telephone Encounter (Signed)
Pt notified of normal labs via my chart.

## 2014-10-06 NOTE — Telephone Encounter (Signed)
Unread mychart message mailed to patient 

## 2014-10-11 ENCOUNTER — Ambulatory Visit (INDEPENDENT_AMBULATORY_CARE_PROVIDER_SITE_OTHER)
Admission: RE | Admit: 2014-10-11 | Discharge: 2014-10-11 | Disposition: A | Discharge status: Home / Self Care | Payer: PRIVATE HEALTH INSURANCE | Source: Ambulatory Visit | Attending: Family Medicine | Admitting: Family Medicine

## 2014-10-11 ENCOUNTER — Ambulatory Visit (INDEPENDENT_AMBULATORY_CARE_PROVIDER_SITE_OTHER): Payer: PRIVATE HEALTH INSURANCE | Admitting: Family Medicine

## 2014-10-11 ENCOUNTER — Encounter: Payer: Self-pay | Admitting: Family Medicine

## 2014-10-11 VITALS — BP 130/86 | HR 54 | Ht 65.0 in | Wt 188.0 lb

## 2014-10-11 DIAGNOSIS — M7070 Other bursitis of hip, unspecified hip: Secondary | ICD-10-CM

## 2014-10-11 DIAGNOSIS — M549 Dorsalgia, unspecified: Secondary | ICD-10-CM | POA: Diagnosis not present

## 2014-10-11 DIAGNOSIS — M75102 Unspecified rotator cuff tear or rupture of left shoulder, not specified as traumatic: Secondary | ICD-10-CM

## 2014-10-11 DIAGNOSIS — G8929 Other chronic pain: Secondary | ICD-10-CM

## 2014-10-11 DIAGNOSIS — M5416 Radiculopathy, lumbar region: Secondary | ICD-10-CM

## 2014-10-11 MED ORDER — GABAPENTIN 100 MG PO CAPS: 100.0000 mg | ORAL_CAPSULE | Freq: Every day | ORAL | Status: DC

## 2014-10-11 NOTE — Progress Notes (Signed)
Pre visit review using our clinic review tool, if applicable. No additional management support is needed unless otherwise documented below in the visit note. 

## 2014-10-11 NOTE — Assessment & Plan Note (Signed)
Improving with conservative therapy. No change in management.

## 2014-10-11 NOTE — Assessment & Plan Note (Signed)
Patient unfortunately didn't not get better with the right side significantly. I do think that there is a possibility of radicular symptoms with patient's history of back surgery. Adjacent segment disease would be concerning. X-rays were ordered today. Patient was put on gabapentin to see if this will make benefit as well. We discussed continuing to work with formal physical therapy and patient will come back again in 3-4 weeks will come back sooner if any significant radicular symptoms occurs.  Spent  25 minutes with patient face-to-face and had greater than 50% of counseling including as described above in assessment and plan.

## 2014-10-11 NOTE — Assessment & Plan Note (Signed)
History of surgery with potential radiculopathy to the lateral right hip. X-rays pending

## 2014-10-11 NOTE — Patient Instructions (Signed)
Good to see you Ice 20 minutes 2 times daily. Usually after activity and before bed. Continue with the physical therapy Your shoulder is great and we will continue to monitor Gabapentin 100mg  at night for next week then 200mg  nightly thereafter.  We will get xray today See me again in 4 weeks.

## 2014-10-11 NOTE — Progress Notes (Signed)
[name] [name], [name] Subjective:    I'm seeing this patient by the request  of:  [name], [name], MD   CC: Shoulder pain left  BJY:NWGNFAOZHYHPI:Subjective Dawn Vargas is a 64 y.o. female coming in with complaint of shoulder pain left side. Patient was seen previously and was diagnosed with a degenerative rotator cuff tear. Patient did show some healing on the ultrasound had previous exam. Patient elected try conservative therapy including home exercises, icing protocol, we discussed over-the-counter medications. Patient has started formal physical therapy and states that she is approximate 60% better. Patient states that she has now have full range of motion and continues to have some mild weakness though. Patient though is very happy with this results and states that she if this was the best her shoulder would get she would be happy. Like to not do any other significant intervention to the shoulder at this time.  Patient is also complaining of bilateral hip pain.  She was seen previously and was given injections for greater trochanteric bursitis. Patient states that her left hip is completely resolved but continues to have difficulty with the right hip. Patient states that it did improve for approximate one week and then seemed to get worse. Patient states that the right hip has always felt a little different than the left hip. Patient has had a history of a herniated disc and did need a microdiscectomy back in early 80s. Patient states that this does feel a little similar.     Past medical history, social, surgical and family history all reviewed in electronic medical record.  Past Medical History  Diagnosis Date  . Hypertension   . Chronic back pain     s/p surgery for herniated disc 93/89)  . Arthritis   . GERD (gastroesophageal reflux disease)   . Hypertension    Past Surgical History  Procedure Laterality  Date  . Vaginal hysterectomy  1999    laparoscopic assisted  . Cholecystectomy  August 25 2012   History   Social History  . Marital Status: Single    Spouse Name: N/A  . Number of Children: N/A  . Years of Education: N/A   Occupational History  . Not on file.   Social History Main Topics  . Smoking status: Former Games developermoker  . Smokeless tobacco: Never Used  . Alcohol Use: No  . Drug Use: No  . Sexual Activity: Not on file   Other Topics Concern  . Not on file   Social History Narrative   Family History  Problem Relation Age of Onset  . Dementia Father   . Prostate cancer Father   . Heart disease Mother     myocardial infarction, congestive heart failure  . Hypertension Mother   . Heart disease Brother     angioplasty  . Hypertension Brother   . Hypertension Sister   . Breast cancer Neg Hx   . Colon cancer Neg Hx      Review of Systems: No headache, visual changes, nausea, vomiting, diarrhea, constipation, dizziness, abdominal pain, skin rash, fevers, chills, night sweats, weight loss, swollen lymph nodes, body aches, joint swelling, muscle aches, chest pain, shortness of breath, mood changes.   Objective Blood pressure 130/86, pulse 54, height 5\' 5"  (1.651 m), weight 188 lb (85.276 kg), last menstrual period 05/21/1998, SpO2 98 %.  General: No apparent distress alert and oriented x3 mood and affect normal, dressed appropriately.  HEENT: Pupils equal, extraocular movements intact  Respiratory: Patient's speak in full sentences and does not appear short of breath  Cardiovascular: No lower extremity edema, non tender, no erythema  Skin: Warm dry intact with no signs of infection or rash on extremities or on axial skeleton.  Abdomen: Soft nontender  Neuro: Cranial nerves II through XII are intact, neurovascularly intact in all extremities with 2+ DTRs and 2+ pulses.  Lymph: No lymphadenopathy of posterior or anterior cervical chain or axillae bilaterally.  Gait normal  with good balance and coordination.  MSK:  Non tender with full range of motion and good stability and symmetric strength and tone of  elbows, wrist,  knee and ankles bilaterally.  Shoulder: left Inspection reveals no abnormalities, atrophy or asymmetry. Palpation is normal with no tenderness over AC joint or bicipital groove. Range of motion full improvement from previous exam Rotator cuff strength normal throughout. signs of impingement with positive Neer and Hawkin's tests, but negative empty can sign. Speeds and Yergason's tests normal. No labral pathology noted with negative Obrien's, negative clunk and good stability. Normal scapular function observed. No painful arc and no drop arm sign. No apprehension sign Contralateral side unremarkable  Hip: Bilateral ROM IR: 25 Deg, ER: 45 Deg, Flexion: 120 Deg, Extension: 100 Deg, Abduction: 45 Deg, Adduction: 45 Deg Strength IR: 5/5, ER: 5/5, Flexion: 5/5, Extension: 5/5, Abduction: 4/5, Adduction: 5/5 Pelvic alignment unremarkable to inspection and palpation. Standing hip rotation and gait without trendelenburg sign / unsteadiness. Greater trochanteric area is tenderness on the right side still. No tenderness over piriformis Positive Faber bilaterally No SI joint tenderness and normal minimal SI movement.      Impression and Recommendations:     This case required medical decision making of moderate complexity.

## 2014-10-19 ENCOUNTER — Encounter: Payer: Self-pay | Admitting: Internal Medicine

## 2014-11-15 ENCOUNTER — Encounter: Payer: Self-pay | Admitting: Family Medicine

## 2014-11-15 ENCOUNTER — Ambulatory Visit (INDEPENDENT_AMBULATORY_CARE_PROVIDER_SITE_OTHER): Payer: PRIVATE HEALTH INSURANCE | Admitting: Family Medicine

## 2014-11-15 VITALS — BP 128/84 | HR 83 | Ht 65.0 in | Wt 193.0 lb

## 2014-11-15 DIAGNOSIS — M5416 Radiculopathy, lumbar region: Secondary | ICD-10-CM

## 2014-11-15 MED ORDER — TRAMADOL HCL 50 MG PO TABS: 50.0000 mg | ORAL_TABLET | Freq: Three times a day (TID) | ORAL | Status: DC | PRN

## 2014-11-15 NOTE — Progress Notes (Signed)
Pre visit review using our clinic review tool, if applicable. No additional management support is needed unless otherwise documented below in the visit note. 

## 2014-11-15 NOTE — Patient Instructions (Addendum)
Good to see you Ice will always be your friend. We will get MRI of your back. I will send you a note about the MRI and discuss epidural.  Tramadol at night.

## 2014-11-15 NOTE — Assessment & Plan Note (Signed)
Patient does have more of an L3-L4 radiculopathy bilaterally. Patient's x-rays do correspond with this. I think that unfortunately she is going to have a nerve root impingement or may be even spinal stenosis. I do feel that advance imaging is warranted at this time because it is affecting her daily activities and she has not responded to conservative therapy including formal physical therapy. Patient will have an MRI ordered and depending on this an epidural may be necessary. We discussed continuing the other conservative measures such as icing and home exercises. We will discuss after further imaging.  Spent  25 minutes with patient face-to-face and had greater than 50% of counseling including as described above in assessment and plan.

## 2014-11-15 NOTE — Progress Notes (Signed)
Dawn Vargas [address] [place], [place] [address] Phone: [phone] Subjective:    I'm seeing this patient by the request  of:  SCOTT, Westley Hummer, MD   CC: Back pain and hip follow-up left shoulder follow-up  BJY:NWGNFAOZHY Dawn Vargas is a 64 y.o. female coming in with complaint of shoulder pain left side. Patient was seen previously and was diagnosed with a degenerative rotator cuff tear. Patient did show some healing on the ultrasound had previous exam. Patient elected try conservative therapy including home exercises, icing protocol, we discussed over-the-counter medications. Patient did finish with formal physical therapy. Patient states there is 100%. Patient is not having any pain. Patient is very happy with the results.  Patient is also complaining of bilateral hip pain.  She was seen previously and was given injections for greater trochanteric bursitis. Patient continues to have right hip pain after the injections. There is concern with patient's history of back arthritis that this could be contribute in. Patient did have x-rays that showed mild progression of the arthritis. Patient states partially she continues to have the bilateral hip pain and states that it is always associated with her back pain. Patient states that she did try the gabapentin but unfortunately she had side effects. Did notice though that the pain was better while she was on [name]. Patient was unable to tolerate the gabapentin to take regularly.     Past medical history, social, surgical and family history all reviewed in electronic medical record.  Past Medical History  Diagnosis Date  . Hypertension   . Chronic back pain     s/p surgery for herniated disc 93/89)  . Arthritis   . GERD (gastroesophageal reflux disease)   . Hypertension    Past Surgical History  Procedure Laterality Date  . Vaginal hysterectomy  1999    laparoscopic assisted  . Cholecystectomy   August 25 2012   History   Social History  . Marital Status: Single    Spouse Name: N/A  . Number of Children: N/A  . Years of Education: N/A   Occupational History  . Not on file.   Social History Main Topics  . Smoking status: Former Games developer  . Smokeless tobacco: Never Used  . Alcohol Use: No  . Drug Use: No  . Sexual Activity: Not on file   Other Topics Concern  . Not on file   Social History Narrative   Family History  Problem Relation Age of Onset  . Dementia Father   . Prostate cancer Father   . Heart disease Mother     myocardial infarction, congestive heart failure  . Hypertension Mother   . Heart disease Brother     angioplasty  . Hypertension Brother   . Hypertension Sister   . Breast cancer Neg Hx   . Colon cancer Neg Hx      Review of Systems: No headache, visual changes, nausea, vomiting, diarrhea, constipation, dizziness, abdominal pain, skin rash, fevers, chills, night sweats, weight loss, swollen lymph nodes, body aches, joint swelling, muscle aches, chest pain, shortness of breath, mood changes.   Objective Blood pressure 128/84, pulse 83, height  (1.651 m), weight 193 lb (87.544 kg), last menstrual period 05/21/1998, SpO2 96 %.  General: No apparent distress alert and oriented x3 mood and affect normal, dressed appropriately.  HEENT: Pupils equal, extraocular movements intact  Respiratory: Patient's speak in full sentences and does not appear short of breath  Cardiovascular: No  lower extremity edema, non tender, no erythema  Skin: Warm dry intact with no signs of infection or rash on extremities or on axial skeleton.  Abdomen: Soft nontender  Neuro: Cranial nerves II through XII are intact, neurovascularly intact in all extremities with 2+ DTRs and 2+ pulses.  Lymph: No lymphadenopathy of posterior or anterior cervical chain or axillae bilaterally.  Gait normal with good balance and coordination.  MSK:  Non tender with full range of motion  and good stability and symmetric strength and tone of  elbows, wrist,  knee and ankles bilaterally.  Shoulder: left Inspection reveals no abnormalities, atrophy or asymmetry. Palpation is normal with no tenderness over AC joint or bicipital groove. Range of motion full improvement from previous exam Rotator cuff strength normal throughout. signs of impingement with positive Neer and Hawkin's tests, but negative empty can sign. Speeds and Yergason's tests normal. No labral pathology noted with negative Obrien's, negative clunk and good stability. Normal scapular function observed. No painful arc and no drop arm sign. No apprehension sign Contralateral side unremarkable  Back Exam:  Inspection: Unremarkable  Motion: Flexion 45 deg, Extension 45 deg, Side Bending to 45 deg bilaterally,  Rotation to 45 deg bilaterally  SLR laying: Positive bilaterally XSLR laying: Positive Palpable tenderness: Severe tenderness mostly over the L3-L4 area bilaterally. Spinal musculature. FABER: negative. Sensory change: Gross sensation intact to all lumbar and sacral dermatomes.  Reflexes: 2+ at both patellar tendons, 2+ at achilles tendons, Babinski's downgoing.  Strength at foot  Plantar-flexion: 5/5 Dorsi-flexion: 5/5 Eversion: 5/5 Inversion: 5/5  Leg strength  Quad: 5/5 Hamstring: 5/5 Hip flexor: 5/5 Hip abductors: 5/5  Gait unremarkable.  Hip: Bilateral ROM IR: 25 Deg, ER: 45 Deg, Flexion: 120 Deg, Extension: 100 Deg, Abduction: 45 Deg, Adduction: 45 Deg Strength IR: 5/5, ER: 5/5, Flexion: 5/5, Extension: 5/5, Abduction: 4/5, Adduction: 5/5 Pelvic alignment unremarkable to inspection and palpation. Standing hip rotation and gait without trendelenburg sign / unsteadiness. Greater trochanteric area is tenderness on the right side still. No tenderness over piriformis Positive Faber bilaterally No SI joint tenderness and normal minimal SI movement.      Impression and Recommendations:      This case required medical decision making of moderate complexity.

## 2014-12-08 ENCOUNTER — Telehealth: Payer: Self-pay | Admitting: Family Medicine

## 2014-12-08 DIAGNOSIS — M5416 Radiculopathy, lumbar region: Secondary | ICD-10-CM

## 2014-12-08 NOTE — Telephone Encounter (Signed)
Patient states Dr. Katrinka BlazingSmith was to check with insurance for coverage on MRI.  Did not see order.  Patient was wanting to follow up.

## 2014-12-08 NOTE — Telephone Encounter (Signed)
Referral entered, sent to brittany who will verify with insurance.

## 2014-12-12 ENCOUNTER — Telehealth: Payer: Self-pay | Admitting: Family Medicine

## 2014-12-12 NOTE — Telephone Encounter (Signed)
Please call patient. She does have lunch till 1:30. She received a letter from insurance about whether the office visit in May was about an accident or not and she is not sure how to answer the question since she sees him about 2 different issues.  Also, She wants to be sure the insurance will cover her MRI before she has it. She stated the imaging placed called her and left her a message to make the appointment. I urged her to go ahead and call them back because they may have that approval.

## 2014-12-12 NOTE — Telephone Encounter (Signed)
lmovm for pt to return call.  

## 2014-12-12 NOTE — Telephone Encounter (Signed)
Discussed with pt

## 2014-12-18 ENCOUNTER — Encounter: Payer: Self-pay | Admitting: Internal Medicine

## 2014-12-19 ENCOUNTER — Ambulatory Visit
Admission: RE | Admit: 2014-12-19 | Discharge: 2014-12-19 | Disposition: A | Discharge status: Home / Self Care | Payer: PRIVATE HEALTH INSURANCE | Source: Ambulatory Visit | Attending: Family Medicine | Admitting: Family Medicine

## 2014-12-19 DIAGNOSIS — M5416 Radiculopathy, lumbar region: Secondary | ICD-10-CM

## 2014-12-20 NOTE — Telephone Encounter (Signed)
Unread mychart message mailed to patient 

## 2015-01-04 ENCOUNTER — Other Ambulatory Visit: Payer: Self-pay | Admitting: *Deleted

## 2015-01-04 DIAGNOSIS — M5416 Radiculopathy, lumbar region: Secondary | ICD-10-CM

## 2015-01-18 ENCOUNTER — Ambulatory Visit
Admission: RE | Admit: 2015-01-18 | Discharge: 2015-01-18 | Disposition: A | Discharge status: Home / Self Care | Payer: PRIVATE HEALTH INSURANCE | Source: Ambulatory Visit | Attending: Family Medicine | Admitting: Family Medicine

## 2015-01-18 VITALS — BP 208/98 | HR 68

## 2015-01-18 DIAGNOSIS — E669 Obesity, unspecified: Secondary | ICD-10-CM

## 2015-01-18 DIAGNOSIS — M5416 Radiculopathy, lumbar region: Secondary | ICD-10-CM

## 2015-01-18 MED ORDER — IOHEXOL 180 MG/ML  SOLN
1.0000 mL | Freq: Once | INTRAMUSCULAR | Status: DC | PRN
  Administered 2015-01-18: 1 mL via EPIDURAL

## 2015-01-18 MED ORDER — METHYLPREDNISOLONE ACETATE 40 MG/ML INJ SUSP (RADIOLOG
120.0000 mg | Freq: Once | INTRAMUSCULAR | Status: AC
  Administered 2015-01-18: 120 mg via EPIDURAL

## 2015-01-18 NOTE — Discharge Instructions (Signed)

## 2015-01-26 ENCOUNTER — Telehealth: Payer: Self-pay | Admitting: Family Medicine

## 2015-01-26 NOTE — Telephone Encounter (Signed)
Spoke with pt, she decided she would like to go & discuss with a Careers adviser. Dr. Katrinka Blazing recommended either Dr. Yetta Barre or Dr. Dutch Quint. Pt stated she would like to discuss with her PCP next week & then let me know which Neurosurgen she would like to see.

## 2015-01-26 NOTE — Telephone Encounter (Signed)
Patient called in to advise that she got her epidural last week, and her pain has gotten worse. She states that the pain is constant and she cannot stand the pain.

## 2015-01-26 NOTE — Telephone Encounter (Signed)
We can either try L5 nerve root injection  Or wait til 24th for another epidural Or she needs to discuss with a surgeon.   Can come in and see me and we can discuss.

## 2015-01-31 ENCOUNTER — Ambulatory Visit (INDEPENDENT_AMBULATORY_CARE_PROVIDER_SITE_OTHER): Payer: PRIVATE HEALTH INSURANCE | Admitting: Internal Medicine

## 2015-01-31 ENCOUNTER — Encounter: Payer: Self-pay | Admitting: Internal Medicine

## 2015-01-31 VITALS — BP 130/80 | HR 69 | Temp 97.9°F | Ht 65.0 in | Wt 190.4 lb

## 2015-01-31 DIAGNOSIS — I10 Essential (primary) hypertension: Secondary | ICD-10-CM

## 2015-01-31 DIAGNOSIS — G8929 Other chronic pain: Secondary | ICD-10-CM

## 2015-01-31 DIAGNOSIS — M545 Low back pain: Secondary | ICD-10-CM | POA: Diagnosis not present

## 2015-01-31 DIAGNOSIS — M799 Soft tissue disorder, unspecified: Secondary | ICD-10-CM

## 2015-01-31 DIAGNOSIS — M7989 Other specified soft tissue disorders: Secondary | ICD-10-CM

## 2015-01-31 DIAGNOSIS — N3080 Other cystitis without hematuria: Secondary | ICD-10-CM

## 2015-01-31 DIAGNOSIS — K219 Gastro-esophageal reflux disease without esophagitis: Secondary | ICD-10-CM

## 2015-01-31 DIAGNOSIS — E669 Obesity, unspecified: Secondary | ICD-10-CM

## 2015-01-31 DIAGNOSIS — M5416 Radiculopathy, lumbar region: Secondary | ICD-10-CM

## 2015-01-31 DIAGNOSIS — N3090 Cystitis, unspecified without hematuria: Secondary | ICD-10-CM

## 2015-01-31 DIAGNOSIS — M549 Dorsalgia, unspecified: Secondary | ICD-10-CM

## 2015-01-31 DIAGNOSIS — M7070 Other bursitis of hip, unspecified hip: Secondary | ICD-10-CM

## 2015-01-31 LAB — BASIC METABOLIC PANEL
BUN: 19 mg/dL (ref 6–23)
CHLORIDE: 101 meq/L (ref 96–112)
CO2: 29 mEq/L (ref 19–32)
Calcium: 9.9 mg/dL (ref 8.4–10.5)
Creatinine, Ser: 0.69 mg/dL (ref 0.40–1.20)
GFR: 90.62 mL/min (ref >60.00)
Glucose, Bld: 83 mg/dL (ref 70–99)
POTASSIUM: 4.3 meq/L (ref 3.5–5.1)
Sodium: 138 mEq/L (ref 135–145)

## 2015-01-31 NOTE — Progress Notes (Signed)
Patient ID: Rowena Moilanen, female   DOB: 19-Apr-1951, 64 y.o.   MRN: [id number]   Subjective:    Patient ID: Mieshia Pepitone, female    DOB: [birth year], 64 y.o.   MRN: [id number]  HPI  Patient here for a scheduled follow up.  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  Since adjusting her diet for this and since stopping soda, she has no diarrhea.  Abdominal pain has resolved.  She is due to f/u with opthalmology in two weeks.  Cataract follow up.  Had a viral infection in 11/2014.  Had persistent dizziness.  Saw ENT.  Dx with [facility].  Epley Maneuvers.  Dizziness resolved.  Just had f/u with Dr Jenne Campus 01/04/15.   Has had recurrent uti's.  Previously saw Dr Evelene Croon.  W/up negative.  Hopes that since her bowels are better, uti's will improve as well.  Has been seeing Dr Katrinka Blazing.  S/p injection - right hip.  Also had back MRI.  S/p epidural.  Since the epidural, back pain has been worse.  Describes aching in her low back.  Recommended referral to neurosurgery.  She prefers to see Dr Marikay Alar.  Wants referral.  Blood pressure has been doing well.  Does notice soft tissue fullness in her left thigh.  Non tender.  Persistent.     Past Medical History  Diagnosis Date  . Hypertension   . Chronic back pain     s/p surgery for herniated disc 93/89)  . Arthritis   . GERD (gastroesophageal reflux disease)   . Hypertension    Past Surgical History  Procedure Laterality Date  . Vaginal hysterectomy  1999    laparoscopic assisted  . Cholecystectomy  August 25 2012   Family History  Problem Relation Age of Onset  . Dementia Father   . Prostate cancer Father   . Heart disease Mother     myocardial infarction, congestive heart failure  . Hypertension Mother   . Heart disease Brother     angioplasty  . Hypertension Brother   . Hypertension Sister   . Breast cancer Neg Hx   . Colon cancer Neg Hx    Social History   Social History  . Marital Status: Single    Spouse Name: N/A  . Number of  Children: N/A  . Years of Education: N/A   Social History Main Topics  . Smoking status: Former Games developer  . Smokeless tobacco: Never Used  . Alcohol Use: No  . Drug Use: No  . Sexual Activity: Not Asked   Other Topics Concern  . None   Social History Narrative     Review of Systems  Constitutional: Negative for appetite change and unexpected weight change.  HENT: Negative for congestion and sinus pressure.   Eyes: Negative for pain and discharge.  Respiratory: Negative for cough, chest tightness and shortness of breath.   Cardiovascular: Negative for chest pain, palpitations and leg swelling.  Gastrointestinal: Negative for nausea, vomiting, abdominal pain and diarrhea.  Genitourinary: Negative for dysuria and difficulty urinating.  Musculoskeletal: Positive for back pain (low back pain as outlined.  ).  Skin: Negative for color change and rash.  Neurological: Negative for dizziness, light-headedness and headaches.  Hematological: Negative for adenopathy. Does not bruise/bleed easily.  Psychiatric/Behavioral: Negative for dysphoric mood and agitation.       Objective:    Physical Exam  Constitutional: She appears well-developed and well-nourished. No distress.  HENT:  Nose: Nose normal.  Mouth/Throat: Oropharynx is  clear and moist.  Eyes: Conjunctivae are normal. Right eye exhibits no discharge. Left eye exhibits no discharge.  Neck: Neck supple. No thyromegaly present.  Cardiovascular: Normal rate and regular rhythm.   Pulmonary/Chest: Breath sounds normal. No respiratory distress. She has no wheezes.  Abdominal: Soft. Bowel sounds are normal. There is no tenderness.  Musculoskeletal: She exhibits no edema or tenderness.  Lymphadenopathy:    She has no cervical adenopathy.  Skin: No rash noted. No erythema.  Psychiatric: She has a normal mood and affect. Her behavior is normal.    BP 130/80 mmHg  Pulse 69  Temp(Src) 97.9 F (36.6 C) (Oral)  Ht 5\' 5"  (1.651 m)   Wt 190 lb 6 oz (86.354 kg)  BMI 31.68 kg/m2  SpO2 96%  LMP 05/21/1998 Wt Readings from Last 3 Encounters:  01/31/15 190 lb 6 oz (86.354 kg)  11/15/14 193 lb (87.544 kg)  10/11/14 188 lb (85.276 kg)     Lab Results  Component Value Date   WBC 8.2 09/29/2014   HGB 14.5 09/29/2014   HCT 43 09/29/2014   PLT 287 09/29/2014   GLUCOSE 83 01/31/2015   CHOL 176 09/29/2014   TRIG 65 09/29/2014   HDL 87* 09/29/2014   LDLCALC 76 09/29/2014   ALT 20 09/29/2014   AST 18 09/29/2014   NA 138 01/31/2015   K 4.3 01/31/2015   CL 101 01/31/2015   CREATININE 0.69 01/31/2015   BUN 19 01/31/2015   CO2 29 01/31/2015   TSH 0.82 09/29/2014    Dg Epidurography  01/18/2015   CLINICAL DATA:  Lumbosacral spondylosis without myelopathy. Bilateral thigh pain. Lower extremity radicular pain. RIGHT-sided pain has been present longer than LEFT-sided pain. Injection is elected on the RIGHT.  EXAM: LUMBAR INTERLAMINAR EPIDURAL INJECTION  FLUOROSCOPY TIME:  MRI 12/19/2014.  PROCEDURE: Procedure: After a thorough discussion of risks and benefits of the procedure, written and verbal consent was obtained. Specific risks included puncture of the thecal sac and dura as well as nontherapeutic injection with general risks of bleeding, infection, injury to nerves, blood vessels, and adjacent structures. Time out form was completed. Verbal consent was obtained by Dr. Carlota Raspberry. We discussed the moderate likelihood of moderate lasting relief/attainment of therapeutic goal. The overlying skin was cleansed with betadine soap and anesthetized with 1% lidocaine without epinephrine. An interlaminar approach was performed at RIGHT L3-L4. 3.5 inch 20 gauge needle was advanced using loss-of-resistance technique.  DIAGNOSTIC/THERAPUETIC EPIDURAL INJECTION: Injection of Omnipaque 180 shows a good epidural pattern with spread above and below the level of needle placement, primarily on the side of needle placement. No vascular or subarachnoid  opacification was seen. 120 mg of Depo-Medrol mixed with 5 cc of 1% Lidocaine were instilled. The procedure was well-tolerated, and the patient was discharged thirty minutes following the injection in good condition.  IMPRESSION: Technically successful first lumbar interlaminar epidural injection at RIGHT L3-L4.   Electronically Signed   By: Andreas Newport M.D.   On: 01/18/2015 15:17       Assessment & Plan:   Problem List Items Addressed This Visit    Bursitis of hip    S/p injection.  Saw Dr Katrinka Blazing.        Chronic back pain    Worsened recently.  Saw Dr Katrinka Blazing.  Had MRI.  S/p epidural.  States pain worsened since epidural.  Recommended referral to [facility].  Request to see Dr Marikay Alar.        GERD (gastroesophageal reflux disease)  Reflux symptoms controlled.   On no medication.  Follow.        Relevant Medications   Probiotic Product (PROBIOTIC PO)   Hypertension - Primary    Blood pressure on outside checks averaging 128-135/70-80s.  Continue same medication regimen.  Follow pressures.  Follow metabolic panel.        Relevant Orders   Basic metabolic panel (Completed)   Lumbar radiculopathy   Relevant Orders   Ambulatory referral to Neurosurgery   Obesity (BMI 30-39.9)    Diet and exercise.  Follow.       Recurrent cystitis    Worked up by urology (Dr Evelene Croon).  CT negative for any urological w/up.  Cystoscopy negative.  Follow.        Soft tissue mass    Noted left thigh.  Discussed referral back to Dr Katrinka Blazing for further evaluation.  She wants to hold at this point.  Follow.         Other Visit Diagnoses    Low back pain, unspecified back pain laterality, with sciatica presence unspecified        Relevant Orders    Ambulatory referral to Neurosurgery        Dale Crittenden, MD

## 2015-01-31 NOTE — Progress Notes (Signed)
Pre-visit discussion using our clinic review tool. No additional management support is needed unless otherwise documented below in the visit note.  

## 2015-02-01 ENCOUNTER — Encounter: Payer: Self-pay | Admitting: Internal Medicine

## 2015-02-02 ENCOUNTER — Other Ambulatory Visit: Payer: Self-pay | Admitting: Internal Medicine

## 2015-02-04 ENCOUNTER — Encounter: Payer: Self-pay | Admitting: Internal Medicine

## 2015-02-04 DIAGNOSIS — M7989 Other specified soft tissue disorders: Secondary | ICD-10-CM | POA: Insufficient documentation

## 2015-02-04 NOTE — Assessment & Plan Note (Signed)
Reflux symptoms controlled.   On no medication.  Follow.

## 2015-02-04 NOTE — Assessment & Plan Note (Signed)
Diet and exercise.  Follow.  

## 2015-02-04 NOTE — Assessment & Plan Note (Signed)
Noted left thigh.  Discussed referral back to Dr Katrinka Blazing for further evaluation.  She wants to hold at this point.  Follow.

## 2015-02-04 NOTE — Assessment & Plan Note (Signed)
Worsened recently.  Saw Dr Katrinka Blazing.  Had MRI.  S/p epidural.  States pain worsened since epidural.  Recommended referral to [facility].  Request to see Dr Marikay Alar.

## 2015-02-04 NOTE — Assessment & Plan Note (Signed)
Worked up by urology (Dr Evelene Croon).  CT negative for any urological w/up.  Cystoscopy negative.  Follow.

## 2015-02-04 NOTE — Assessment & Plan Note (Signed)
S/p injection.  Saw Dr Katrinka Blazing.

## 2015-02-04 NOTE — Assessment & Plan Note (Signed)
Blood pressure on outside checks averaging 128-135/70-80s.  Continue same medication regimen.  Follow pressures.  Follow metabolic panel.

## 2015-02-13 ENCOUNTER — Other Ambulatory Visit: Payer: Self-pay | Admitting: Neurological Surgery

## 2015-02-22 ENCOUNTER — Encounter (HOSPITAL_COMMUNITY)
Admission: RE | Admit: 2015-02-22 | Discharge: 2015-02-22 | Disposition: A | Discharge status: Home / Self Care | Payer: PRIVATE HEALTH INSURANCE | Source: Ambulatory Visit | Attending: Neurological Surgery | Admitting: Neurological Surgery

## 2015-02-22 ENCOUNTER — Encounter (HOSPITAL_COMMUNITY): Payer: Self-pay

## 2015-02-22 ENCOUNTER — Ambulatory Visit (HOSPITAL_COMMUNITY)
Admission: RE | Admit: 2015-02-22 | Discharge: 2015-02-22 | Disposition: A | Discharge status: Home / Self Care | Payer: PRIVATE HEALTH INSURANCE | Source: Ambulatory Visit | Attending: Neurological Surgery | Admitting: Neurological Surgery

## 2015-02-22 DIAGNOSIS — Z01812 Encounter for preprocedural laboratory examination: Secondary | ICD-10-CM | POA: Diagnosis not present

## 2015-02-22 DIAGNOSIS — Z01818 Encounter for other preprocedural examination: Secondary | ICD-10-CM | POA: Diagnosis present

## 2015-02-22 DIAGNOSIS — R918 Other nonspecific abnormal finding of lung field: Secondary | ICD-10-CM | POA: Insufficient documentation

## 2015-02-22 DIAGNOSIS — J45909 Unspecified asthma, uncomplicated: Secondary | ICD-10-CM | POA: Diagnosis not present

## 2015-02-22 DIAGNOSIS — I10 Essential (primary) hypertension: Secondary | ICD-10-CM | POA: Diagnosis not present

## 2015-02-22 DIAGNOSIS — Z01810 Encounter for preprocedural cardiovascular examination: Secondary | ICD-10-CM | POA: Diagnosis not present

## 2015-02-22 DIAGNOSIS — M5136 Other intervertebral disc degeneration, lumbar region: Secondary | ICD-10-CM

## 2015-02-22 HISTORY — DX: Unspecified asthma, uncomplicated: J45.909

## 2015-02-22 HISTORY — DX: Other complications of anesthesia, initial encounter: T88.59XA

## 2015-02-22 HISTORY — DX: Personal history of other infectious and parasitic diseases: Z86.19

## 2015-02-22 HISTORY — DX: Personal history of other diseases of the respiratory system: Z87.09

## 2015-02-22 HISTORY — DX: Other specified postprocedural states: Z98.890

## 2015-02-22 HISTORY — DX: Allergic rhinitis, unspecified: J30.9

## 2015-02-22 HISTORY — DX: Unspecified rotator cuff tear or rupture of unspecified shoulder, not specified as traumatic: M75.100

## 2015-02-22 HISTORY — DX: Adverse effect of unspecified anesthetic, initial encounter: T41.45XA

## 2015-02-22 HISTORY — DX: Personal history of urinary calculi: Z87.442

## 2015-02-22 HISTORY — DX: Urinary tract infection, site not specified: N39.0

## 2015-02-22 HISTORY — DX: Cardiac murmur, unspecified: R01.1

## 2015-02-22 HISTORY — DX: Nausea with vomiting, unspecified: R11.2

## 2015-02-22 LAB — BASIC METABOLIC PANEL
Anion gap: 6 (ref 5–15)
BUN: 11 mg/dL (ref 6–20)
CALCIUM: 9.4 mg/dL (ref 8.9–10.3)
CO2: 30 mmol/L (ref 22–32)
CREATININE: 0.67 mg/dL (ref 0.44–1.00)
Chloride: 103 mmol/L (ref 101–111)
GFR calc Af Amer: >60 mL/min (ref >60)
Glucose, Bld: 86 mg/dL (ref 65–99)
Potassium: 3.6 mmol/L (ref 3.5–5.1)
SODIUM: 139 mmol/L (ref 135–145)

## 2015-02-22 LAB — CBC WITH DIFFERENTIAL/PLATELET
BASOS ABS: 0 10*3/uL (ref 0.0–0.1)
Basophils Relative: 0 %
EOS ABS: 0.1 10*3/uL (ref 0.0–0.7)
EOS PCT: 2 %
HCT: 43.2 % (ref 36.0–46.0)
Hemoglobin: 14.2 g/dL (ref 12.0–15.0)
Lymphocytes Relative: 27 %
Lymphs Abs: 1.3 10*3/uL (ref 0.7–4.0)
MCH: 29.9 pg (ref 26.0–34.0)
MCHC: 32.9 g/dL (ref 30.0–36.0)
MCV: 90.9 fL (ref 78.0–100.0)
Monocytes Absolute: 0.4 10*3/uL (ref 0.1–1.0)
Monocytes Relative: 8 %
Neutro Abs: 3.1 10*3/uL (ref 1.7–7.7)
Neutrophils Relative %: 63 %
PLATELETS: 199 10*3/uL (ref 150–400)
RBC: 4.75 MIL/uL (ref 3.87–5.11)
RDW: 12.8 % (ref 11.5–15.5)
WBC: 4.9 10*3/uL (ref 4.0–10.5)

## 2015-02-22 LAB — ABO/RH: ABO/RH(D): O NEG

## 2015-02-22 LAB — SURGICAL PCR SCREEN
MRSA, PCR: POSITIVE — AB
STAPHYLOCOCCUS AUREUS: POSITIVE — AB

## 2015-02-22 LAB — PROTIME-INR
INR: 0.98 (ref 0.00–1.49)
PROTHROMBIN TIME: 13.2 s (ref 11.6–15.2)

## 2015-02-22 NOTE — Progress Notes (Signed)
Script called into [facility] for mupirocin. Message left for Dawn Vargas and informed of the results of pcr screen, and the need to pick up her med.

## 2015-02-22 NOTE — Pre-Procedure Instructions (Signed)
    Dawn Vargas  02/22/2015      [facility] [address]), [place] - [address]) [place] [address] Phone: [phone] Fax: [phone]  [facility] [address] [place], [place] - [address] [place] [address] Phone: [phone] Fax: [phone]    Your procedure is scheduled on Wednesday, September 21st, 2016.  Report to [facility] Admitting at 11:00 A.M.  Call this number if you have problems the morning of surgery:  [phone]   Remember:  Do not eat food or drink liquids after midnight.   Take these medicines the morning of surgery with A SIP OF WATER: Acetaminophen (Tylenol) if needed, Tramadol (Ultram) if needed  Stop taking: Aspirin, NSAIDS, Ibuprofen, Aleve, Naproxen, Advil, Motrin, Fish Oil, all herbal medications, and all vitamins.     Do not wear jewelry, make-up or nail polish.  Do not wear lotions, powders, or perfumes.  You may NOT wear deodorant.  Do not shave 48 hours prior to surgery.    Do not bring valuables to the hospital.  [facility] is not responsible for any belongings or valuables.  Contacts, dentures or bridgework may not be worn into surgery.  Leave your suitcase in the car.  After surgery it may be brought to your room.  For patients admitted to the hospital, discharge time will be determined by your treatment team.  Patients discharged the day of surgery will not be allowed to drive home.   Special instructions:  See attached.   Please read over the following fact sheets that you were given. Pain Booklet, Coughing and Deep Breathing, MRSA Information and Surgical Site Infection Prevention

## 2015-02-22 NOTE — Progress Notes (Signed)
PCP - Dr. [facility]- Patient denies a cardiologist  EKG - 02/22/15 - Epic CXR - 02/22/15 - Epic  Echo- 2009 - requested from [facility] Stress test - 2009 - requested from [facility]- pt denies  Patient denies chest pain and shortness of breath at PAT appointment.

## 2015-02-23 LAB — TYPE AND SCREEN
ABO/RH(D): O NEG
ANTIBODY SCREEN: NEGATIVE

## 2015-02-28 ENCOUNTER — Inpatient Hospital Stay (HOSPITAL_COMMUNITY)
Admission: RE | Admit: 2015-02-28 | Payer: PRIVATE HEALTH INSURANCE | Source: Ambulatory Visit | Admitting: Neurological Surgery

## 2015-02-28 ENCOUNTER — Encounter (HOSPITAL_COMMUNITY): Admission: RE | Payer: Self-pay | Source: Ambulatory Visit

## 2015-02-28 SURGERY — POSTERIOR LUMBAR FUSION 2 LEVEL
Anesthesia: General | Site: Back

## 2015-03-07 ENCOUNTER — Other Ambulatory Visit: Payer: Self-pay | Admitting: Internal Medicine

## 2015-04-02 ENCOUNTER — Other Ambulatory Visit: Payer: Self-pay

## 2015-04-02 MED ORDER — TRAMADOL HCL 50 MG PO TABS: 50.0000 mg | ORAL_TABLET | Freq: Two times a day (BID) | ORAL | Status: DC | PRN

## 2015-04-02 NOTE — Telephone Encounter (Signed)
Please advise refill? 

## 2015-04-02 NOTE — Telephone Encounter (Signed)
rx ok'd for tramadol #60 with no refills.  rx signed and placed on your desk.

## 2015-04-25 ENCOUNTER — Encounter: Payer: Self-pay | Admitting: Nurse Practitioner

## 2015-04-25 ENCOUNTER — Ambulatory Visit (INDEPENDENT_AMBULATORY_CARE_PROVIDER_SITE_OTHER): Payer: PRIVATE HEALTH INSURANCE | Admitting: Nurse Practitioner

## 2015-04-25 VITALS — BP 140/82 | HR 65 | Temp 98.3°F | Resp 14 | Ht 65.0 in | Wt 194.6 lb

## 2015-04-25 DIAGNOSIS — Z0000 Encounter for general adult medical examination without abnormal findings: Secondary | ICD-10-CM | POA: Diagnosis not present

## 2015-04-25 DIAGNOSIS — E669 Obesity, unspecified: Secondary | ICD-10-CM

## 2015-04-25 DIAGNOSIS — Z13820 Encounter for screening for osteoporosis: Secondary | ICD-10-CM | POA: Diagnosis not present

## 2015-04-25 LAB — HEMOGLOBIN A1C: Hgb A1c MFr Bld: 5.4 % (ref 4.6–6.5)

## 2015-04-25 LAB — COMPREHENSIVE METABOLIC PANEL
ALBUMIN: 4.1 g/dL (ref 3.5–5.2)
ALK PHOS: 65 U/L (ref 39–117)
ALT: 16 U/L (ref 0–35)
AST: 19 U/L (ref 0–37)
BILIRUBIN TOTAL: 0.5 mg/dL (ref 0.2–1.2)
BUN: 13 mg/dL (ref 6–23)
CALCIUM: 9.5 mg/dL (ref 8.4–10.5)
CO2: 31 mEq/L (ref 19–32)
CREATININE: 0.66 mg/dL (ref 0.40–1.20)
Chloride: 104 mEq/L (ref 96–112)
GFR: 95.33 mL/min (ref >60.00)
Glucose, Bld: 84 mg/dL (ref 70–99)
Potassium: 4.1 mEq/L (ref 3.5–5.1)
Sodium: 139 mEq/L (ref 135–145)
Total Protein: 6.8 g/dL (ref 6.0–8.3)

## 2015-04-25 LAB — LIPID PANEL
CHOLESTEROL: 160 mg/dL (ref 0–200)
HDL: 68.1 mg/dL (ref >39.00)
LDL CALC: 80 mg/dL (ref 0–99)
NonHDL: 92.11
Total CHOL/HDL Ratio: 2
Triglycerides: 61 mg/dL (ref 0.0–149.0)
VLDL: 12.2 mg/dL (ref 0.0–40.0)

## 2015-04-25 LAB — CBC WITH DIFFERENTIAL/PLATELET
Basophils Absolute: 0 10*3/uL (ref 0.0–0.1)
Basophils Relative: 0.5 % (ref 0.0–3.0)
EOS ABS: 0.2 10*3/uL (ref 0.0–0.7)
Eosinophils Relative: 2.6 % (ref 0.0–5.0)
HEMATOCRIT: 43.6 % (ref 36.0–46.0)
Hemoglobin: 14.3 g/dL (ref 12.0–15.0)
LYMPHS PCT: 17.5 % (ref 12.0–46.0)
Lymphs Abs: 1.1 10*3/uL (ref 0.7–4.0)
MCHC: 32.8 g/dL (ref 30.0–36.0)
MCV: 90.3 fl (ref 78.0–100.0)
MONOS PCT: 8.5 % (ref 3.0–12.0)
Monocytes Absolute: 0.5 10*3/uL (ref 0.1–1.0)
NEUTROS PCT: 70.9 % (ref 43.0–77.0)
Neutro Abs: 4.5 10*3/uL (ref 1.4–7.7)
PLATELETS: 197 10*3/uL (ref 150.0–400.0)
RBC: 4.83 Mil/uL (ref 3.87–5.11)
RDW: 14.1 % (ref 11.5–15.5)
WBC: 6.4 10*3/uL (ref 4.0–10.5)

## 2015-04-25 LAB — VITAMIN D 25 HYDROXY (VIT D DEFICIENCY, FRACTURES): VITD: 33.78 ng/mL (ref 30.00–100.00)

## 2015-04-25 MED ORDER — LOSARTAN POTASSIUM 25 MG PO TABS: 25.0000 mg | ORAL_TABLET | Freq: Two times a day (BID) | ORAL | Status: DC

## 2015-04-25 NOTE — Patient Instructions (Signed)

## 2015-04-25 NOTE — Assessment & Plan Note (Signed)
Discussed acute and chronic issues. Reviewed health maintenance measures, PFSHx, and immunizations. Obtain routine labs Vitamin D, Lipid panel, CBC w/ diff, A1c, and CMET.   Patient would like to defer mammogram until 2017 Declines vaccinations Clinical breast exam performed without significant findings Pelvic exam deferred due to no complaints

## 2015-04-25 NOTE — Assessment & Plan Note (Signed)
Encouraged healthy diet and exercise

## 2015-04-25 NOTE — Progress Notes (Signed)
Pre visit review using our clinic review tool, if applicable. No additional management support is needed unless otherwise documented below in the visit note. 

## 2015-04-25 NOTE — Progress Notes (Signed)
Patient ID: Dawn Vargas, female    DOB: Feb 11, 1951  Age: 64 y.o. MRN: [id number]  CC: Annual Exam   HPI Dawn Vargas presents for her annual physical exam.   1) Health Maintenance-   Diet- Healthy diet until dinner time, likes sweets   Exercise- No formal currently   Immunizations- Declines Prevnar and Flu vaccine today  Mammogram- 2015 in August, Due  Pap- Hysterectomy   Bone Density- Dexa scan   Colonoscopy- 04/30/2012  Eye Exam- 2016 and opthalmology on Friday for cataract possibility   Dental Exam- UTD  HIV screening- With youngest child was probably screened  Hepatitis C screening- Ditto above  2) Chronic Problems-  HTN- Range 129-136/ 80-88   Refill on Losartan  History Dawn Vargas has a past medical history of Hypertension; Chronic back pain; Arthritis; Hypertension; Reactive airway disease; Allergic rhinitis; Allergic asthma; Frequent UTI; Chronic back pain; History of shingles; History of kidney stones; Complication of anesthesia; PONV (postoperative nausea and vomiting); GERD (gastroesophageal reflux disease); Heart murmur; History of bronchitis; and Rotator cuff tear.   She has past surgical history that includes Back surgery; Colonoscopy (2013); laporoscopy (08/22/1997); Cholecystectomy (August 25 2012); Vaginal hysterectomy (1999); Esophagogastroduodenoscopy (01/2011); and Eye surgery.   Her family history includes Dementia in her father; Heart disease in her brother and mother; Hypertension in her brother, mother, and sister; Prostate cancer in her father. There is no history of Breast cancer or Colon cancer.She reports that she has never smoked. She has never used smokeless tobacco. She reports that she drinks alcohol. She reports that she does not use illicit drugs.  Outpatient Prescriptions Prior to Visit  Medication Sig Dispense Refill  . acetaminophen (TYLENOL ARTHRITIS PAIN) 650 MG CR tablet Take 1,300 mg by mouth at bedtime.    . B Complex-C (SUPER B COMPLEX PO)  Take 1 tablet by mouth daily.    Marland Kitchen CRANBERRY PO Take 1 capsule by mouth daily.    . Glucosamine-Chondroitin-MSM-D (TRIPLE FLEX/VITAMIN D3) TABS Take 1 tablet by mouth daily.    . Omega-3 Fatty Acids (FISH OIL PO) Take 1 capsule by mouth daily.    Marland Kitchen OVER THE COUNTER MEDICATION Take 500 mg by mouth daily. Tumeric 528m    . Probiotic Product (PROBIOTIC PEARLS PO) Take 1 capsule by mouth daily.    . traMADol (ULTRAM) 50 MG tablet Take 1 tablet (50 mg total) by mouth 2 (two) times daily as needed. 60 tablet 0  . losartan (COZAAR) 25 MG tablet TAKE ONE TABLET BY MOUTH TWICE DAILY 60 tablet 2   No facility-administered medications prior to visit.    ROS Review of Systems  Constitutional: Negative for fever, chills, diaphoresis, fatigue and unexpected weight change.  HENT: Negative for tinnitus and trouble swallowing.   Eyes: Negative for visual disturbance.  Respiratory: Negative for chest tightness, shortness of breath and wheezing.   Cardiovascular: Negative for chest pain, palpitations and leg swelling.  Gastrointestinal: Negative for nausea, vomiting, abdominal pain, diarrhea, constipation and blood in stool.  Genitourinary: Negative for dysuria, hematuria, vaginal discharge, difficulty urinating and vaginal pain.  Musculoskeletal: Positive for back pain. Negative for myalgias, arthralgias and gait problem.  Skin: Negative for color change and rash.  Neurological: Negative for dizziness, weakness, numbness and headaches.  Psychiatric/Behavioral: Negative for suicidal ideas and sleep disturbance. The patient is not nervous/anxious.     Objective:  BP 140/82 mmHg  Pulse 65  Temp(Src) 98.3 F (36.8 C)  Resp 14  Ht 5' 5"  (1.651 m)  Wt 194 lb 9.6 oz (88.27 kg)  BMI 32.38 kg/m2  SpO2 99%  LMP 05/21/1998  Physical Exam  Constitutional: She is oriented to person, place, and time. She appears well-developed and well-nourished. No distress.  HENT:  Head: Normocephalic and atraumatic.   Right Ear: External ear normal.  Left Ear: External ear normal.  Nose: Nose normal.  Mouth/Throat: Oropharynx is clear and moist. No oropharyngeal exudate.  TMs and canals clear bilaterally  Eyes: Conjunctivae and EOM are normal. Pupils are equal, round, and reactive to light. Right eye exhibits no discharge. Left eye exhibits no discharge. No scleral icterus.  Neck: Normal range of motion. Neck supple. No thyromegaly present.  Cardiovascular: Normal rate, regular rhythm, normal heart sounds and intact distal pulses.  Exam reveals no gallop and no friction rub.   No murmur heard. Pulmonary/Chest: Effort normal and breath sounds normal. No respiratory distress. She has no wheezes. She has no rales. She exhibits no tenderness.  Clinical breast exam without significant findings.   Abdominal: Soft. Bowel sounds are normal. She exhibits no distension and no mass. There is no tenderness. There is no rebound and no guarding.  Musculoskeletal: Normal range of motion. She exhibits no edema or tenderness.  Lymphadenopathy:    She has no cervical adenopathy.  Neurological: She is alert and oriented to person, place, and time. She has normal reflexes. No cranial nerve deficit. She exhibits normal muscle tone. Coordination normal.  Skin: Skin is warm and dry. No rash noted. She is not diaphoretic. No erythema. No pallor.  Psychiatric: She has a normal mood and affect. Her behavior is normal. Judgment and thought content normal.   Assessment & Plan:   Dawn Vargas was seen today for annual exam.  Diagnoses and all orders for this visit:  Routine general medical examination at a health care facility -     Comp Met (CMET) -     CBC w/Diff -     HgB A1c -     Lipid Profile -     Vitamin D (25 hydroxy)  Obesity (BMI 30-39.9)  Other orders -     losartan (COZAAR) 25 MG tablet; Take 1 tablet (25 mg total) by mouth 2 (two) times daily.  I have changed Dawn Vargas's losartan. I am also having her maintain  her B Complex-C (SUPER B COMPLEX PO), OVER THE COUNTER MEDICATION, TRIPLE FLEX/VITAMIN D3, CRANBERRY PO, Probiotic Product (PROBIOTIC PEARLS PO), Omega-3 Fatty Acids (FISH OIL PO), acetaminophen, and traMADol.  Meds ordered this encounter  Medications  . losartan (COZAAR) 25 MG tablet    Sig: Take 1 tablet (25 mg total) by mouth 2 (two) times daily.    Dispense:  60 tablet    Refill:  2    Order Specific Question:  Supervising Provider    Answer:  Crecencio Mc [[id number]]     Follow-up: Return in about 1 year (around 04/24/2016) for CPE w/ fasting labs (with Dr. Nicki Reaper).

## 2015-04-26 ENCOUNTER — Encounter: Payer: Self-pay | Admitting: *Deleted

## 2015-05-23 ENCOUNTER — Telehealth: Payer: Self-pay

## 2015-05-23 NOTE — Telephone Encounter (Signed)
Patient called the Triage line, concerned that she has lost the script that you gave at her last appointment to have a bone density check done.  Please advise.  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  Cell number is [phone].  Thanks

## 2015-05-24 NOTE — Telephone Encounter (Signed)
She can call [facility] to schedule.   [facility]  Address: [address], [place], [place] [address]  Phone: [phone]

## 2015-05-30 ENCOUNTER — Other Ambulatory Visit: Payer: Self-pay | Admitting: Internal Medicine

## 2015-05-31 NOTE — Telephone Encounter (Signed)
She had been referred to [facility].  Need to know if she is receiving pain medication from any other provider.  Also, how often is she taking the tramadol?  Just need more info before refilling the medication.

## 2015-06-07 ENCOUNTER — Encounter: Payer: PRIVATE HEALTH INSURANCE | Admitting: Internal Medicine

## 2015-06-07 ENCOUNTER — Ambulatory Visit
Admission: RE | Admit: 2015-06-07 | Discharge: 2015-06-07 | Disposition: A | Discharge status: Home / Self Care | Payer: PRIVATE HEALTH INSURANCE | Source: Ambulatory Visit | Attending: Nurse Practitioner | Admitting: Nurse Practitioner

## 2015-06-07 DIAGNOSIS — Z780 Asymptomatic menopausal state: Secondary | ICD-10-CM | POA: Diagnosis not present

## 2015-06-07 DIAGNOSIS — Z13820 Encounter for screening for osteoporosis: Secondary | ICD-10-CM | POA: Diagnosis not present

## 2015-06-07 NOTE — Telephone Encounter (Signed)
Patient saw neurosurgeon in September and was scheduled for a fusion but insurance denied, patient saw chiropractor and has helped but not relieved, patient takes tylenol during the day and functions ok but needs the tramadol for nighttime pain to sleep patient stated she only takes one per night.

## 2015-06-08 ENCOUNTER — Other Ambulatory Visit: Payer: Self-pay | Admitting: Internal Medicine

## 2015-06-08 NOTE — Telephone Encounter (Signed)
I ok'd rx for tramadol #60 with no refills.

## 2015-06-13 NOTE — Progress Notes (Signed)
Please let Dawn Vargas know that her bone density results did show osteoporosis (thinning of the bones) and she will need to schedule a follow up to discuss treatment options with Dr. Lorin PicketScott- 30 minute appointment at her convenience. No urgency.   Called patient and left message to call back for test results and message from Naomie Deanarrie Doss NP

## 2015-09-06 ENCOUNTER — Encounter: Payer: Self-pay | Admitting: Physician Assistant

## 2015-09-06 ENCOUNTER — Ambulatory Visit: Payer: Self-pay | Admitting: Physician Assistant

## 2015-09-06 VITALS — BP 120/80 | HR 62 | Temp 97.7°F

## 2015-09-06 DIAGNOSIS — M2550 Pain in unspecified joint: Secondary | ICD-10-CM

## 2015-09-06 NOTE — Progress Notes (Signed)
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  Which doctor should she see first?  Denies numbness or tingling, no known injury  O: vitals wnl, nad, spine is nontender, pt moves easily, n/v intact  A: back pain, consult  P: start with sports med for eval, can refer to pain management if needed but don't think patient really needs this at this time

## 2015-09-21 ENCOUNTER — Ambulatory Visit: Payer: Self-pay | Admitting: Physician Assistant

## 2015-09-21 ENCOUNTER — Encounter: Payer: Self-pay | Admitting: Physician Assistant

## 2015-09-21 VITALS — BP 130/80 | HR 58 | Temp 98.4°F

## 2015-09-21 DIAGNOSIS — N390 Urinary tract infection, site not specified: Secondary | ICD-10-CM

## 2015-09-21 LAB — POCT URINALYSIS DIPSTICK
BILIRUBIN UA: NEGATIVE
Blood, UA: NEGATIVE
GLUCOSE UA: NEGATIVE
KETONES UA: NEGATIVE
Leukocytes, UA: NEGATIVE
Nitrite, UA: NEGATIVE
Protein, UA: NEGATIVE
SPEC GRAV UA: 1.015
Urobilinogen, UA: 0.2
pH, UA: 7

## 2015-09-21 MED ORDER — TOLTERODINE TARTRATE 2 MG PO TABS: 2.0000 mg | ORAL_TABLET | Freq: Every day | ORAL | Status: DC

## 2015-09-21 NOTE — Progress Notes (Signed)
   Subjective:Urinary frequency    Patient ID: Dawn Vargas, female    DOB: 02/24/1951, 65 y.o.   MRN: [id number]  HPI Patient states over two years of urinary urgency/frequency and dysuria. States numerous UTI. Seen by Urologist last year with no obvious findings. New onset of compliant two days ago.Denies fever/flank pain. No vaginal discharge. No palliative measure for this compliant.   Review of Systems Hypertension, GERD and recurrent cystitis    Objective:   Physical Exam NAD, VSS, appears anxious. Dip UA unremarkable.       Assessment & Plan:Overactive Bladder.  Detrol 2 mg and advised follow up with Urologist of choice.

## 2015-09-24 ENCOUNTER — Telehealth: Payer: Self-pay | Admitting: Internal Medicine

## 2015-09-24 NOTE — Telephone Encounter (Signed)
Pt called and states that she feels she has another UTI. She was seen in the acute clinic on 4/14 for UTI and states that the test came back negative but her AZO strip that she did this morning shows that she is (+) for UTI. She would like to be referred to another urologist since she has had 18 UTI/symptoms in the last 3-6 months. She does NOT want to see Dr. Evelene CroonWolff again.  She stated that she showed her chiropractor where her pain is coming from and he states that it is her kidney.  Please advise. Does she need to be seen or can she just be referred?

## 2015-09-24 NOTE — Telephone Encounter (Signed)
Please advise. Thanks.  

## 2015-09-24 NOTE — Telephone Encounter (Signed)
I do not have a problem placing an order for the referral, but does she feel needs to be seen more acutely for this infection.  If so, I can work her in somewhere this week.  Let me know either way and I will place the order for the referral.

## 2015-09-24 NOTE — Telephone Encounter (Signed)
Left a VM for the patient.

## 2015-09-24 NOTE — Telephone Encounter (Signed)
Ok.  Please f/u if we do not hear back from her and see what she would like for me to do.  Thanks

## 2016-03-09 ENCOUNTER — Other Ambulatory Visit: Payer: Self-pay | Admitting: Nurse Practitioner

## 2016-04-06 ENCOUNTER — Other Ambulatory Visit: Payer: Self-pay | Admitting: Internal Medicine

## 2016-04-29 ENCOUNTER — Encounter: Payer: Self-pay | Admitting: Physician Assistant

## 2016-04-29 ENCOUNTER — Ambulatory Visit: Payer: Self-pay | Admitting: Physician Assistant

## 2016-04-29 DIAGNOSIS — Z299 Encounter for prophylactic measures, unspecified: Secondary | ICD-10-CM

## 2016-04-29 NOTE — Progress Notes (Signed)
Patient came in to have blood drawn for testing per Dr. Earney MalletFowlers from [facility].

## 2016-04-30 LAB — HEPATIC FUNCTION PANEL
ALK PHOS: 77 IU/L (ref 39–117)
ALT: 13 IU/L (ref 0–32)
AST: 19 IU/L (ref 0–40)
Albumin: 4.3 g/dL (ref 3.6–4.8)
BILIRUBIN, DIRECT: 0.11 mg/dL (ref 0.00–0.40)
Bilirubin Total: 0.4 mg/dL (ref 0.0–1.2)
TOTAL PROTEIN: 6.9 g/dL (ref 6.0–8.5)

## 2016-05-29 ENCOUNTER — Other Ambulatory Visit (HOSPITAL_COMMUNITY)
Admission: RE | Admit: 2016-05-29 | Discharge: 2016-05-29 | Disposition: A | Discharge status: Home / Self Care | Payer: Managed Care, Other (non HMO) | Source: Ambulatory Visit | Attending: Internal Medicine | Admitting: Internal Medicine

## 2016-05-29 ENCOUNTER — Ambulatory Visit (INDEPENDENT_AMBULATORY_CARE_PROVIDER_SITE_OTHER): Payer: Managed Care, Other (non HMO) | Admitting: Internal Medicine

## 2016-05-29 ENCOUNTER — Encounter: Payer: Self-pay | Admitting: Internal Medicine

## 2016-05-29 VITALS — BP 136/82 | HR 81 | Temp 97.7°F | Ht 64.5 in | Wt 187.4 lb

## 2016-05-29 DIAGNOSIS — M5416 Radiculopathy, lumbar region: Secondary | ICD-10-CM

## 2016-05-29 DIAGNOSIS — N3090 Cystitis, unspecified without hematuria: Secondary | ICD-10-CM

## 2016-05-29 DIAGNOSIS — Z1151 Encounter for screening for human papillomavirus (HPV): Secondary | ICD-10-CM | POA: Insufficient documentation

## 2016-05-29 DIAGNOSIS — R3915 Urgency of urination: Secondary | ICD-10-CM | POA: Diagnosis not present

## 2016-05-29 DIAGNOSIS — Z01419 Encounter for gynecological examination (general) (routine) without abnormal findings: Secondary | ICD-10-CM | POA: Insufficient documentation

## 2016-05-29 DIAGNOSIS — Z1231 Encounter for screening mammogram for malignant neoplasm of breast: Secondary | ICD-10-CM

## 2016-05-29 DIAGNOSIS — E669 Obesity, unspecified: Secondary | ICD-10-CM

## 2016-05-29 DIAGNOSIS — Z0000 Encounter for general adult medical examination without abnormal findings: Secondary | ICD-10-CM

## 2016-05-29 DIAGNOSIS — I10 Essential (primary) hypertension: Secondary | ICD-10-CM | POA: Diagnosis not present

## 2016-05-29 DIAGNOSIS — Z1239 Encounter for other screening for malignant neoplasm of breast: Secondary | ICD-10-CM

## 2016-05-29 LAB — URINALYSIS, ROUTINE W REFLEX MICROSCOPIC
Bilirubin Urine: NEGATIVE
Ketones, ur: NEGATIVE
Nitrite: NEGATIVE
PH: 7 (ref 5.0–8.0)
TOTAL PROTEIN, URINE-UPE24: NEGATIVE
URINE GLUCOSE: NEGATIVE
UROBILINOGEN UA: 0.2 (ref 0.0–1.0)

## 2016-05-29 NOTE — Progress Notes (Signed)
Patient ID: Dawn Vargas, female   DOB: 04/07/1951, 65 y.o.   MRN: [id number]   Subjective:    Patient ID: Dawn Vargas, female    DOB: 08/29/1950, 65 y.o.   MRN: [id number]  HPI  Patient here for her physical exam.  She is doing better from a msk standpoint.  Was having issues with left rotator cuff.  Resolved.  No pain now.  Good rom.  She is seeing a chiropractor (Dr Steve RattlerMinder) for her back.  Back is better.  Wears a girdle.  States this helps.  Sleeps on her side.  Bursitis resolved.  She is walking.  Balance and her gait - better.  She is seeing Dr Ether GriffinsFowler for her toe nail fungus.  Taking medication.  He is following her liver function tests.  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  Since avoiding this, bowels are doing well.  Overall feels better.  She has been having issues with recurrent urinary tract infections.  Has been on abx.  Having some urinary urgency and spasm now.  States occasionally will notice blood when wiping.  Denies any rectal bleeding.  Is s/p hysterectomy.  Feels like in urine.  Blood pressures on outside checks - 120-130s/70-80s.     Past Medical History:  Diagnosis Date  . Allergic asthma   . Allergic rhinitis   . Arthritis   . Chronic back pain    s/p surgery for herniated disc 93/89)  . Chronic back pain   . Complication of anesthesia   . Frequent UTI   . GERD (gastroesophageal reflux disease)    pt. states no symptoms  . Heart murmur    history of  . History of bronchitis   . History of kidney stones   . History of shingles   . Hypertension   . Hypertension   . PONV (postoperative nausea and vomiting)    pt. states history of nausea in 1989  . Reactive airway disease   . Rotator cuff tear    left shoulder- pt. states healing   Past Surgical History:  Procedure Laterality Date  . BACK SURGERY    . CHOLECYSTECTOMY  August 25 2012  . COLONOSCOPY  2013  . ESOPHAGOGASTRODUODENOSCOPY  01/2011  . EYE SURGERY    . laporoscopy   08/22/1997  . VAGINAL HYSTERECTOMY  1999   laparoscopic assisted   Family History  Problem Relation Age of Onset  . Dementia Father   . Prostate cancer Father   . Heart disease Mother     myocardial infarction, congestive heart failure  . Hypertension Mother   . Heart disease Brother     angioplasty  . Hypertension Brother   . Hypertension Sister   . Breast cancer Neg Hx   . Colon cancer Neg Hx    Social History   Social History  . Marital status: Single    Spouse name: N/A  . Number of children: N/A  . Years of education: N/A   Social History Main Topics  . Smoking status: Never Smoker  . Smokeless tobacco: Never Used  . Alcohol use 0.0 oz/week     Comment: socially  . Drug use: No  . Sexual activity: Not Asked   Other Topics Concern  . None   Social History Narrative  . None    Outpatient Encounter Prescriptions as of 05/29/2016  Medication Sig  . acetaminophen (TYLENOL ARTHRITIS PAIN) 650 MG CR tablet Take 1,300 mg by  mouth at bedtime.  . B Complex-C (SUPER B COMPLEX PO) Take 1 tablet by mouth daily.  Marland Kitchen CRANBERRY PO Take 1 capsule by mouth daily.  . Glucosamine-Chondroitin-MSM-D (TRIPLE FLEX/VITAMIN D3) TABS Take 1 tablet by mouth daily.  Marland Kitchen losartan (COZAAR) 25 MG tablet TAKE ONE TABLET BY MOUTH TWICE DAILY .  NEEDS  OFFICE  VISIT  FOR  FURTHER  REFILLS.  Marland Kitchen Omega-3 Fatty Acids (FISH OIL PO) Take 1 capsule by mouth daily.  Marland Kitchen OVER THE COUNTER MEDICATION Take 500 mg by mouth daily. Tumeric 500mg   . Probiotic Product (PROBIOTIC PEARLS PO) Take 1 capsule by mouth daily.  Marland Kitchen tolterodine (DETROL) 2 MG tablet Take 1 tablet (2 mg total) by mouth daily.  . traMADol (ULTRAM) 50 MG tablet Take by mouth.   No facility-administered encounter medications on file as of 05/29/2016.     Review of Systems  Constitutional: Negative for appetite change and unexpected weight change.  HENT: Negative for congestion and sinus pressure.   Eyes: Negative for pain and visual  disturbance.  Respiratory: Negative for cough, chest tightness and shortness of breath.   Cardiovascular: Negative for chest pain, palpitations and leg swelling.  Gastrointestinal: Negative for abdominal pain, diarrhea, nausea and vomiting.  Genitourinary:       Urinary urgency as outlined.  Some spasm.  Some blood with wiping.    Musculoskeletal: Negative for back pain and joint swelling.  Skin: Negative for color change and rash.  Neurological: Negative for dizziness, light-headedness and headaches.  Hematological: Negative for adenopathy. Does not bruise/bleed easily.  Psychiatric/Behavioral: Negative for agitation and dysphoric mood.       Objective:     Blood pressure rechecked by me:  136/82  Physical Exam  Constitutional: She is oriented to person, place, and time. She appears well-developed and well-nourished. No distress.  HENT:  Nose: Nose normal.  Mouth/Throat: Oropharynx is clear and moist.  Eyes: Right eye exhibits no discharge. Left eye exhibits no discharge. No scleral icterus.  Neck: Neck supple. No thyromegaly present.  Cardiovascular: Normal rate and regular rhythm.   Pulmonary/Chest: Breath sounds normal. No accessory muscle usage. No tachypnea. No respiratory distress. She has no decreased breath sounds. She has no wheezes. She has no rhonchi. Right breast exhibits no inverted nipple, no mass, no nipple discharge and no tenderness (no axillary adenopathy). Left breast exhibits no inverted nipple, no mass, no nipple discharge and no tenderness (no axilarry adenopathy).  Abdominal: Soft. Bowel sounds are normal. There is no tenderness.  Genitourinary:  Genitourinary Comments: Normal external genitalia.  Vaginal vault without lesions.  S/p hysterectomy.  Atrophy changes present.  Pap smear performed.  Could not appreciate any adnexal masses or tenderness.    Musculoskeletal: She exhibits no edema or tenderness.  Lymphadenopathy:    She has no cervical adenopathy.    Neurological: She is alert and oriented to person, place, and time.  Skin: Skin is warm. No rash noted. No erythema.  Psychiatric: She has a normal mood and affect. Her behavior is normal.    BP 136/82   Pulse 81   Temp 97.7 F (36.5 C) (Oral)   Ht 5' 4.5" (1.638 m)   Wt 187 lb 6.4 oz (85 kg)   LMP 05/21/1998   SpO2 96%   BMI 31.67 kg/m  Wt Readings from Last 3 Encounters:  05/29/16 187 lb 6.4 oz (85 kg)  04/25/15 194 lb 9.6 oz (88.3 kg)  02/22/15 190 lb 4.8 oz (86.3 kg)  Lab Results  Component Value Date   WBC 4.4 05/30/2016   HGB 12.9 05/30/2016   HCT 38.1 05/30/2016   PLT 189.0 05/30/2016   GLUCOSE 102 (H) 05/30/2016   CHOL 127 05/30/2016   TRIG 47.0 05/30/2016   HDL 61.20 05/30/2016   LDLCALC 56 05/30/2016   ALT 12 05/30/2016   AST 15 05/30/2016   NA 139 05/30/2016   K 4.3 05/30/2016   CL 103 05/30/2016   CREATININE 0.68 05/30/2016   BUN 23 05/30/2016   CO2 30 05/30/2016   TSH 1.05 05/30/2016   INR 0.98 02/22/2015   HGBA1C 5.4 04/25/2015    Dg Bone Density  Result Date: 06/07/2015 EXAM: DUAL X-RAY ABSORPTIOMETRY (DXA) FOR BONE MINERAL DENSITY IMPRESSION: Dear Dr. Tami Ribasoss, Your patient Marjorie Smolderdwina Travaglini completed a BMD test on 06/07/2015 using the Lunar iDXA DXA System (analysis version: 14.10) manufactured by [employer]. The following summarizes the results of our evaluation. PATIENT BIOGRAPHICAL: Name: Dawn ShadeDerosa, Contina J Patient ID: [id number] Birth Date: 09/24/1950 Height: 64.5 in. Gender: Female Exam Date: 06/07/2015 Weight: 195.5 lbs. Indications: Caucasian, Height Loss, History of Spinal Surgery, Hysterectomy, Oophorectomy Bilateral, Postmenopausal Fractures: Treatments: Multi-Vitamin with calcium, Vitamin D ASSESSMENT: The BMD measured at Femur Neck Left is 0.694 g/cm2 with a T-score of -2.5. This patient is considered osteoporotic according to World Health Organization [facility](WHO) criteria. Site Region Measured Measured WHO Young Adult BMD Date Age Classification  T-score AP Spine L1-L4 06/07/2015 65.8 Osteopenia -1.6 1.001 g/cm2 DualFemur Neck Left 06/07/2015 65.8 Osteoporosis -2.5 0.694 g/cm2 World Health Organization [facility](WHO) criteria for post-menopausal, Caucasian Women: Normal:       T-score at or above -1 SD Osteopenia:   T-score between -1 and -2.5 SD Osteoporosis: T-score at or below -2.5 SD RECOMMENDATIONS: National Osteoporosis Foundation recommends that FDA-approved medical therapies be considered in postmenopausal women and men age 66 or older with a: 1. Hip or vertebral (clinical or morphometric) fracture. 2. T-score of < -2.5 at the spine or hip. 3. Ten-year fracture probability by FRAX of 3% or greater for hip fracture or 20% or greater for major osteoporotic fracture. All treatment decisions require clinical judgment and consideration of individual patient factors, including patient preferences, co-morbidities, previous drug use, risk factors not captured in the FRAX model (e.g. falls, vitamin D deficiency, increased bone turnover, interval significant decline in bone density) and possible under - or over-estimation of fracture risk by FRAX. All patients should ensure an adequate intake of dietary calcium (1200 mg/d) and vitamin D (800 IU daily) unless contraindicated. FOLLOW-UP: People with diagnosed cases of osteoporosis or at high risk for fracture should have regular bone mineral density tests. For patients eligible for Medicare, routine testing is allowed once every 2 years. The testing frequency can be increased to one year for patients who have rapidly progressing disease, those who are receiving or discontinuing medical therapy to restore bone mass, or have additional risk factors. I have reviewed this report, and agree with the above findings. [facility] Electronically Signed   By: Annia Beltrew  Davis M.D.   On: 06/07/2015 11:00       Assessment & Plan:   Problem List Items Addressed This Visit    Health care maintenance    Physical today  05/29/16.  Is s/p hysterectomy.  Overdue mammogram.  Schedule.  PAP 05/29/16.  Colonoscopy 04/30/12 - one polyp sigmoid colon and redundant colon.        Relevant Orders   Cytology - PAP (Completed)   Hypertension  Outside blood pressure readings as outlined.  Recheck here improved.  Same medication regimen.  Follow pressures.  Follow metabolic panel.        Relevant Orders   CBC with Differential/Platelet (Completed)   Comprehensive metabolic panel (Completed)   TSH (Completed)   Lipid panel (Completed)   VITAMIN D 25 Hydroxy (Vit-D Deficiency, Fractures) (Completed)   Lumbar radiculopathy    Better since seeing chiropractor.  Follow.        Obesity (BMI 30-39.9)    Diet and exercise.  Follow.       Recurrent cystitis    Has been worked up by urology previously.  CT negative.  Previous cystoscopy negative.  This has been a while.  Now with reoccurring symptoms and infections and notice of intermittent blood with wiping.  Will refer back to urology for further evaluation - Dr Achilles Dunk.        Routine general medical examination at a health care facility - Primary    Other Visit Diagnoses    Urinary urgency       Relevant Orders   Urinalysis, Routine w reflex microscopic (Completed)   CULTURE, URINE COMPREHENSIVE (Completed)   Breast cancer screening       Relevant Orders   MM DIGITAL SCREENING BILATERAL       Dale [place], MD

## 2016-05-29 NOTE — Assessment & Plan Note (Addendum)
Physical today 05/29/16.  Is s/p hysterectomy.  Overdue mammogram.  Schedule.  PAP 05/29/16.  Colonoscopy 04/30/12 - one polyp sigmoid colon and redundant colon.

## 2016-05-29 NOTE — Progress Notes (Signed)
Pre visit review using our clinic review tool, if applicable. No additional management support is needed unless otherwise documented below in the visit note. 

## 2016-05-30 ENCOUNTER — Other Ambulatory Visit (INDEPENDENT_AMBULATORY_CARE_PROVIDER_SITE_OTHER): Payer: Managed Care, Other (non HMO)

## 2016-05-30 DIAGNOSIS — I10 Essential (primary) hypertension: Secondary | ICD-10-CM

## 2016-05-30 LAB — COMPREHENSIVE METABOLIC PANEL
ALBUMIN: 4.2 g/dL (ref 3.5–5.2)
ALK PHOS: 61 U/L (ref 39–117)
ALT: 12 U/L (ref 0–35)
AST: 15 U/L (ref 0–37)
BUN: 23 mg/dL (ref 6–23)
CO2: 30 mEq/L (ref 19–32)
CREATININE: 0.68 mg/dL (ref 0.40–1.20)
Calcium: 9.2 mg/dL (ref 8.4–10.5)
Chloride: 103 mEq/L (ref 96–112)
GFR: 91.79 mL/min (ref >60.00)
GLUCOSE: 102 mg/dL — AB (ref 70–99)
POTASSIUM: 4.3 meq/L (ref 3.5–5.1)
SODIUM: 139 meq/L (ref 135–145)
TOTAL PROTEIN: 6.4 g/dL (ref 6.0–8.3)
Total Bilirubin: 0.4 mg/dL (ref 0.2–1.2)

## 2016-05-30 LAB — LIPID PANEL
CHOL/HDL RATIO: 2
Cholesterol: 127 mg/dL (ref 0–200)
HDL: 61.2 mg/dL (ref >39.00)
LDL Cholesterol: 56 mg/dL (ref 0–99)
NONHDL: 65.33
Triglycerides: 47 mg/dL (ref 0.0–149.0)
VLDL: 9.4 mg/dL (ref 0.0–40.0)

## 2016-05-30 LAB — CBC WITH DIFFERENTIAL/PLATELET
BASOS ABS: 0 10*3/uL (ref 0.0–0.1)
Basophils Relative: 0.6 % (ref 0.0–3.0)
EOS ABS: 0.2 10*3/uL (ref 0.0–0.7)
Eosinophils Relative: 4.7 % (ref 0.0–5.0)
HCT: 38.1 % (ref 36.0–46.0)
HEMOGLOBIN: 12.9 g/dL (ref 12.0–15.0)
LYMPHS ABS: 1.2 10*3/uL (ref 0.7–4.0)
Lymphocytes Relative: 27.5 % (ref 12.0–46.0)
MCHC: 33.8 g/dL (ref 30.0–36.0)
MCV: 89.3 fl (ref 78.0–100.0)
MONO ABS: 0.5 10*3/uL (ref 0.1–1.0)
Monocytes Relative: 10.5 % (ref 3.0–12.0)
NEUTROS PCT: 56.7 % (ref 43.0–77.0)
Neutro Abs: 2.5 10*3/uL (ref 1.4–7.7)
Platelets: 189 10*3/uL (ref 150.0–400.0)
RBC: 4.27 Mil/uL (ref 3.87–5.11)
RDW: 13.6 % (ref 11.5–15.5)
WBC: 4.4 10*3/uL (ref 4.0–10.5)

## 2016-05-30 LAB — CYTOLOGY - PAP
DIAGNOSIS: NEGATIVE
HPV (WINDOPATH): NOT DETECTED

## 2016-05-30 LAB — VITAMIN D 25 HYDROXY (VIT D DEFICIENCY, FRACTURES): VITD: 37.63 ng/mL (ref 30.00–100.00)

## 2016-05-30 LAB — TSH: TSH: 1.05 u[IU]/mL (ref 0.35–4.50)

## 2016-05-31 LAB — CULTURE, URINE COMPREHENSIVE: Colony Count: >=100000

## 2016-06-03 ENCOUNTER — Telehealth: Payer: Self-pay

## 2016-06-03 MED ORDER — CEFDINIR 300 MG PO CAPS: 300.0000 mg | ORAL_CAPSULE | Freq: Two times a day (BID) | ORAL | 0 refills | Status: DC

## 2016-06-03 NOTE — Telephone Encounter (Signed)
-----   Message from Dale Durhamharlene Scott, MD sent at 06/03/2016  7:38 AM EST ----- Notify pt that her urine culture is positive for infection.  Will need abx.  Confirm no allergy to pcn or cephalosporins.  (not listed as allergy).  If no, then you will need to send in omnicef 300mg  bid to take for one week.  Cholesterol looks good.  PAP ok.  Vitamin D level, thyroid test, kidney function tests and liver function tests are wnl.

## 2016-06-04 ENCOUNTER — Encounter: Payer: Self-pay | Admitting: Internal Medicine

## 2016-06-04 NOTE — Assessment & Plan Note (Signed)
Outside blood pressure readings as outlined.  Recheck here improved.  Same medication regimen.  Follow pressures.  Follow metabolic panel.

## 2016-06-04 NOTE — Assessment & Plan Note (Signed)
Has been worked up by urology previously.  CT negative.  Previous cystoscopy negative.  This has been a while.  Now with reoccurring symptoms and infections and notice of intermittent blood with wiping.  Will refer back to urology for further evaluation - Dr Achilles Dunkope.

## 2016-06-04 NOTE — Assessment & Plan Note (Signed)
Diet and exercise.  Follow.  

## 2016-06-04 NOTE — Assessment & Plan Note (Signed)
Better since seeing chiropractor.  Follow.

## 2016-06-05 ENCOUNTER — Encounter: Payer: Self-pay | Admitting: Internal Medicine

## 2016-07-08 ENCOUNTER — Ambulatory Visit
Admission: RE | Admit: 2016-07-08 | Discharge: 2016-07-08 | Disposition: A | Discharge status: Home / Self Care | Payer: Managed Care, Other (non HMO) | Source: Ambulatory Visit | Attending: Internal Medicine | Admitting: Internal Medicine

## 2016-07-08 DIAGNOSIS — Z1239 Encounter for other screening for malignant neoplasm of breast: Secondary | ICD-10-CM

## 2016-08-05 ENCOUNTER — Ambulatory Visit
Admission: RE | Admit: 2016-08-05 | Discharge: 2016-08-05 | Disposition: A | Discharge status: Home / Self Care | Payer: Managed Care, Other (non HMO) | Source: Ambulatory Visit | Attending: Internal Medicine | Admitting: Internal Medicine

## 2016-08-05 DIAGNOSIS — Z1231 Encounter for screening mammogram for malignant neoplasm of breast: Secondary | ICD-10-CM | POA: Insufficient documentation

## 2016-08-12 ENCOUNTER — Ambulatory Visit (INDEPENDENT_AMBULATORY_CARE_PROVIDER_SITE_OTHER): Payer: Managed Care, Other (non HMO)

## 2016-08-12 ENCOUNTER — Encounter: Payer: Self-pay | Admitting: Internal Medicine

## 2016-08-12 ENCOUNTER — Ambulatory Visit (INDEPENDENT_AMBULATORY_CARE_PROVIDER_SITE_OTHER): Payer: Managed Care, Other (non HMO) | Admitting: Internal Medicine

## 2016-08-12 VITALS — BP 140/82 | HR 77 | Temp 97.8°F | Resp 16 | Ht 64.5 in | Wt 192.6 lb

## 2016-08-12 DIAGNOSIS — M79672 Pain in left foot: Secondary | ICD-10-CM | POA: Diagnosis not present

## 2016-08-12 DIAGNOSIS — M79671 Pain in right foot: Secondary | ICD-10-CM | POA: Diagnosis not present

## 2016-08-12 DIAGNOSIS — I10 Essential (primary) hypertension: Secondary | ICD-10-CM

## 2016-08-12 DIAGNOSIS — M25511 Pain in right shoulder: Secondary | ICD-10-CM

## 2016-08-12 DIAGNOSIS — R739 Hyperglycemia, unspecified: Secondary | ICD-10-CM | POA: Diagnosis not present

## 2016-08-12 DIAGNOSIS — Z0289 Encounter for other administrative examinations: Secondary | ICD-10-CM | POA: Diagnosis not present

## 2016-08-12 NOTE — Progress Notes (Signed)
Patient ID: Dawn Vargas, female   DOB: 10/09/1950, 66 y.o.   MRN: [id number]   Subjective:    Patient ID: Dawn Vargas, female    DOB: 1950-07-18, 66 y.o.   MRN: [id number]  HPI  Patient here for a scheduled follow up.  Was treated for uti last visit.  Abdominal discomfort has resolved.  No urinary symptoms.  She does report left foot pain that started 10 days ago.  Pain increases with walking.  Pain across the top of her foot.  advil helped.  Some minimal swelling and redness.  Better now.  Reports right shoulder pain.  Hurts with lifting.  No chest pain.  No sob.  No acid reflux.  Bowels stable.     Past Medical History:  Diagnosis Date  . Allergic asthma   . Allergic rhinitis   . Arthritis   . Chronic back pain    s/p surgery for herniated disc 93/89)  . Chronic back pain   . Complication of anesthesia   . Frequent UTI   . GERD (gastroesophageal reflux disease)    pt. states no symptoms  . Heart murmur    history of  . History of bronchitis   . History of kidney stones   . History of shingles   . Hypertension   . Hypertension   . PONV (postoperative nausea and vomiting)    pt. states history of nausea in 1989  . Reactive airway disease   . Rotator cuff tear    left shoulder- pt. states healing   Past Surgical History:  Procedure Laterality Date  . BACK SURGERY    . CHOLECYSTECTOMY  August 25 2012  . COLONOSCOPY  2013  . ESOPHAGOGASTRODUODENOSCOPY  01/2011  . EYE SURGERY    . laporoscopy  08/22/1997  . VAGINAL HYSTERECTOMY  1999   laparoscopic assisted   Family History  Problem Relation Age of Onset  . Dementia Father   . Prostate cancer Father   . Heart disease Mother     myocardial infarction, congestive heart failure  . Hypertension Mother   . Heart disease Brother     angioplasty  . Hypertension Brother   . Hypertension Sister   . Breast cancer Cousin     maternal side-50's  . Colon cancer Neg Hx    Social History   Social History  . Marital  status: Single    Spouse name: N/A  . Number of children: N/A  . Years of education: N/A   Social History Main Topics  . Smoking status: Never Smoker  . Smokeless tobacco: Never Used  . Alcohol use 0.0 oz/week     Comment: socially  . Drug use: No  . Sexual activity: Not Asked   Other Topics Concern  . None   Social History Narrative  . None    Outpatient Encounter Prescriptions as of 08/12/2016  Medication Sig  . acetaminophen (TYLENOL ARTHRITIS PAIN) 650 MG CR tablet Take 1,300 mg by mouth at bedtime.  . B Complex-C (SUPER B COMPLEX PO) Take 1 tablet by mouth daily.  Marland Kitchen CRANBERRY PO Take 1 capsule by mouth daily.  . Glucosamine-Chondroitin-MSM-D (TRIPLE FLEX/VITAMIN D3) TABS Take 1 tablet by mouth daily.  Marland Kitchen losartan (COZAAR) 25 MG tablet TAKE ONE TABLET BY MOUTH TWICE DAILY .  NEEDS  OFFICE  VISIT  FOR  FURTHER  REFILLS.  Marland Kitchen Omega-3 Fatty Acids (FISH OIL PO) Take 1 capsule by mouth daily.  Marland Kitchen OVER THE COUNTER MEDICATION Take 500  mg by mouth daily. Tumeric 500mg   . Probiotic Product (PROBIOTIC PEARLS PO) Take 1 capsule by mouth daily.  . [DISCONTINUED] cefdinir (OMNICEF) 300 MG capsule Take 1 capsule (300 mg total) by mouth 2 (two) times daily. (Patient not taking: Reported on 08/12/2016)  . [DISCONTINUED] tolterodine (DETROL) 2 MG tablet Take 1 tablet (2 mg total) by mouth daily. (Patient not taking: Reported on 08/12/2016)  . [DISCONTINUED] traMADol (ULTRAM) 50 MG tablet Take by mouth.   No facility-administered encounter medications on file as of 08/12/2016.     Review of Systems  Constitutional: Negative for appetite change and unexpected weight change.  HENT: Negative for congestion and sinus pressure.   Respiratory: Negative for cough, chest tightness and shortness of breath.   Cardiovascular: Negative for chest pain, palpitations and leg swelling.  Gastrointestinal: Negative for abdominal pain, diarrhea, nausea and vomiting.  Genitourinary: Negative for difficulty urinating  and dysuria.  Musculoskeletal:       Left foot pain as outlined.  Right shoulder pain as outlined.   Skin: Negative for color change and rash.  Neurological: Negative for dizziness, light-headedness and headaches.  Psychiatric/Behavioral: Negative for agitation and dysphoric mood.       Objective:    Physical Exam  Constitutional: She appears well-developed and well-nourished. No distress.  HENT:  Nose: Nose normal.  Mouth/Throat: Oropharynx is clear and moist.  Neck: Neck supple. No thyromegaly present.  Cardiovascular: Normal rate and regular rhythm.   Pulmonary/Chest: Breath sounds normal. No respiratory distress. She has no wheezes.  Abdominal: Soft. Bowel sounds are normal. There is no tenderness.  Musculoskeletal: She exhibits no edema or tenderness.  Increased pain - left foot - to palpaton.    Lymphadenopathy:    She has no cervical adenopathy.  Skin: No rash noted. No erythema.  Psychiatric: She has a normal mood and affect. Her behavior is normal.    BP 140/82 (BP Location: Left Arm, Patient Position: Sitting, Cuff Size: Large)   Pulse 77   Temp 97.8 F (36.6 C) (Oral)   Resp 16   Ht 5' 4.5" (1.638 m)   Wt 192 lb 9.6 oz (87.4 kg)   LMP 05/21/1998   SpO2 98%   BMI 32.55 kg/m  Wt Readings from Last 3 Encounters:  08/12/16 192 lb 9.6 oz (87.4 kg)  05/29/16 187 lb 6.4 oz (85 kg)  04/25/15 194 lb 9.6 oz (88.3 kg)     Lab Results  Component Value Date   WBC 4.4 05/30/2016   HGB 12.9 05/30/2016   HCT 38.1 05/30/2016   PLT 189.0 05/30/2016   GLUCOSE 102 (H) 05/30/2016   CHOL 127 05/30/2016   TRIG 47.0 05/30/2016   HDL 61.20 05/30/2016   LDLCALC 56 05/30/2016   ALT 12 05/30/2016   AST 15 05/30/2016   NA 139 05/30/2016   K 4.3 05/30/2016   CL 103 05/30/2016   CREATININE 0.68 05/30/2016   BUN 23 05/30/2016   CO2 30 05/30/2016   TSH 1.05 05/30/2016   INR 0.98 02/22/2015   HGBA1C 5.4 04/25/2015    Mm Digital Screening Bilateral  Result Date:  08/06/2016 CLINICAL DATA:  Screening. EXAM: DIGITAL SCREENING BILATERAL MAMMOGRAM WITH CAD COMPARISON:  Previous exam(s). ACR Breast Density Category a: The breast tissue is almost entirely fatty. FINDINGS: There are no findings suspicious for malignancy. Images were processed with CAD. IMPRESSION: No mammographic evidence of malignancy. A result letter of this screening mammogram will be mailed directly to the patient. RECOMMENDATION: Screening mammogram in  one year. (Code:SM-B-01Y) BI-RADS CATEGORY  1: Negative. Electronically Signed   By: Harmon Pier M.D.   On: 08/06/2016 10:18       Assessment & Plan:   Problem List Items Addressed This Visit    Hypertension    Blood pressure has been under good control.  Continue same medication regimen.  Follow pressures.  Follow metabolic panel.         Other Visit Diagnoses    Left foot pain    -  Primary   pain for the last 10 days.  unsure if injured.  check xray.     Relevant Orders   Ambulatory referral to Podiatry   DG Foot 2 Views Left (Completed)   Right foot pain       Having right foot discomfort and numb feeling in toes.  using supports.  concern regarding hammertoes.  refer to podiatry.     Relevant Orders   Ambulatory referral to Podiatry   Right shoulder pain, unspecified chronicity       pain in right shoulder.  check xray.     Relevant Orders   DG Shoulder Right (Completed)   Encounter for completion of form with patient       form completed for her work.     Hyperglycemia       Relevant Orders   Comprehensive metabolic panel   Hemoglobin A1c       Dale [place], MD

## 2016-08-12 NOTE — Progress Notes (Signed)
Pre-visit discussion using our clinic review tool. No additional management support is needed unless otherwise documented below in the visit note.  

## 2016-08-13 ENCOUNTER — Telehealth: Payer: Self-pay

## 2016-08-13 NOTE — Telephone Encounter (Signed)
-----   Message from Dale Durhamharlene Scott, MD sent at 08/13/2016  5:39 AM EST ----- Please call and notify pt that her foot xray revealed no acute abnormality.  Shoulder xray revealed no evidence of fracture or other abnormality.  Given persistent pain in shoulder, recommend physical therapy for further evaluation and treatment.  If agreeable, let me know and I will place the order for the referral.

## 2016-08-13 NOTE — Telephone Encounter (Signed)
Please call pt back at [phone]

## 2016-08-13 NOTE — Telephone Encounter (Signed)
Pt did not want to have PT at this time she will call back later if she changes her mind.

## 2016-08-13 NOTE — Telephone Encounter (Signed)
lmtrc

## 2016-08-17 ENCOUNTER — Encounter: Payer: Self-pay | Admitting: Internal Medicine

## 2016-08-17 NOTE — Assessment & Plan Note (Signed)
Blood pressure has been under good control.  Continue same medication regimen.  Follow pressures.  Follow metabolic panel.   

## 2016-08-25 ENCOUNTER — Telehealth: Payer: Self-pay | Admitting: Internal Medicine

## 2016-08-25 NOTE — Telephone Encounter (Signed)
Information has been sent to scan unable to change at this time but it is documented in this message.

## 2016-08-25 NOTE — Telephone Encounter (Signed)
Pt called and stated on the paperwork that you had filled out for the insurance company you had left the weight off. Pt added it to the copy she is sending to the insurance company and just wanted to make sure that you add it to your copy.

## 2016-09-09 ENCOUNTER — Ambulatory Visit: Payer: No Typology Code available for payment source | Admitting: Podiatry

## 2016-09-14 NOTE — Progress Notes (Signed)
[facility] [address] [place], [place] [address] Phone: [phone] Subjective:    I'm seeing this patient by the request  of:  SCOTT, Westley Hummer, MD   CC: Right shoulder pain    BJY:NWGNFAOZHY  YOUSRA Vargas is a 66 y.o. female coming in with complaint of right shoulder pain. Describes pain as a dull, throbbing aching pain. Was seen previously and had intersubstance tearing of the contralateral side greater than 2 years ago. Patient states this is similar but a little bit more radiation down the arm. Denies any neck pain. Denies any weakness. States though that can wake her up at night and does affects some daily activities. Rates the severity of pain a 6 out of 10 and worsening. Concerned because she does not want to get as severe as the contralateral side did.     Past Medical History:  Diagnosis Date  . Allergic asthma   . Allergic rhinitis   . Arthritis   . Chronic back pain    s/p surgery for herniated disc 93/89)  . Chronic back pain   . Complication of anesthesia   . Frequent UTI   . GERD (gastroesophageal reflux disease)    pt. states no symptoms  . Heart murmur    history of  . History of bronchitis   . History of kidney stones   . History of shingles   . Hypertension   . Hypertension   . PONV (postoperative nausea and vomiting)    pt. states history of nausea in 1989  . Reactive airway disease   . Rotator cuff tear    left shoulder- pt. states healing   Past Surgical History:  Procedure Laterality Date  . BACK SURGERY    . CHOLECYSTECTOMY  August 25 2012  . COLONOSCOPY  2013  . ESOPHAGOGASTRODUODENOSCOPY  01/2011  . EYE SURGERY    . laporoscopy  08/22/1997  . VAGINAL HYSTERECTOMY  1999   laparoscopic assisted   Social History   Social History  . Marital status: Single    Spouse name: N/A  . Number of children: N/A  . Years of education: N/A   Social History Main Topics  . Smoking status: Never Smoker  . Smokeless  tobacco: Never Used  . Alcohol use 0.0 oz/week     Comment: socially  . Drug use: No  . Sexual activity: Not Asked   Other Topics Concern  . None   Social History Narrative  . None   Allergies  Allergen Reactions  . Other Other (See Comments)    Smoke - severe reactive airway disease   . Benadryl [Diphenhydramine Hcl] Hives  . Oxycodone Other (See Comments)    dizzy  . Beef-Derived Products Other (See Comments)    Gi upset  . Caladryl [Pramoxine-Calamine] Hives  . Clarithromycin Other (See Comments)    Gi upset  . Cleocin [Clindamycin Hcl] Other (See Comments)    GI upset   . Eggs Or Egg-Derived Products Diarrhea  . Hydrochlorothiazide Other (See Comments)    dizzy  . Soybean-Containing Drug Products Diarrhea  . Tomato Other (See Comments)    Small amount is okay, large amount causes diarrhea - Also potatoes   Family History  Problem Relation Age of Onset  . Dementia Father   . Prostate cancer Father   . Heart disease Mother     myocardial infarction, congestive heart failure  . Hypertension Mother   . Heart disease Brother  angioplasty  . Hypertension Brother   . Hypertension Sister   . Breast cancer Cousin     maternal side-50's  . Colon cancer Neg Hx     Past medical history, social, surgical and family history all reviewed in electronic medical record.  No pertanent information unless stated regarding to the chief complaint.   Review of Systems:Review of systems updated and as accurate as of 09/15/16  No headache, visual changes, nausea, vomiting, diarrhea, constipation, dizziness, abdominal pain, skin rash, fevers, chills, night sweats, weight loss, swollen lymph nodes, body aches, joint swelling, muscle aches, chest pain, shortness of breath, mood changes.   Objective  Blood pressure 140/90, pulse 97, resp. rate 16, weight 196 lb 2 oz (89 kg), last menstrual period 05/21/1998, SpO2 98 %. Systems examined below as of 09/15/16   General: No apparent  distress alert and oriented x3 mood and affect normal, dressed appropriately.  HEENT: Pupils equal, extraocular movements intact  Respiratory: Patient's speak in full sentences and does not appear short of breath  Cardiovascular: No lower extremity edema, non tender, no erythema  Skin: Warm dry intact with no signs of infection or rash on extremities or on axial skeleton.  Abdomen: Soft nontender  Neuro: Cranial nerves II through XII are intact, neurovascularly intact in all extremities with 2+ DTRs and 2+ pulses.  Lymph: No lymphadenopathy of posterior or anterior cervical chain or axillae bilaterally.  Gait normal with good balance and coordination.  MSK:  Non tender with full range of motion and good stability and symmetric strength and tone of  elbows, wrist, hip, knee and ankles bilaterally.  \Shoulder: Right Inspection reveals no abnormalities, atrophy or asymmetry. Palpation is normal with no tenderness over AC joint or bicipital groove. ROM is full in all planes passively. Rotator cuff strength normal throughout. signs of impingement with positive Neer and Hawkin's tests, but negative empty can sign. Speeds and Yergason's tests normal. No labral pathology noted with negative Obrien's, negative clunk and good stability. Normal scapular function observed. No painful arc and no drop arm sign. No apprehension sign  MSK US performed of: Right This study was ordered, performed, and interpreted by Terrilee Files D.O.  Shoulder:   Supraspinatus: Very small intersubstance tear noted Bursal bulge seen with shoulder abduction on impingement view. Infraspinatus:  Appears normal on long and transverse views. Significant increase in Doppler flow Subscapularis:  Appears normal on long and transverse views. Positive bursa Teres Minor:  Appears normal on long and transverse views. AC joint: Moderate arthritis Glenohumeral Joint:  Appears normal without effusion. Glenoid Labrum:  Intact without  visualized tears. Biceps Tendon:  Appears normal on long and transverse views, no fraying of tendon, tendon located in intertubercular groove, no subluxation with shoulder internal or external rotation.  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  Verbal informed consent obtained.  Time-out conducted.  Noted no overlying erythema, induration, or other signs of local infection.  Skin prepped in a sterile fashion.  Local anesthesia: Topical Ethyl chloride.  With sterile technique and under real time ultrasound guidance:  Joint visualized.  23g 1  inch needle inserted posterior approach. Pictures taken for needle placement. Patient did have injection of 2 cc  of 1% lidocaine, 2 cc of 0.5% Marcaine, and 1.0 cc of Kenalog 40 mg/dL. Completed without difficulty  Pain immediately resolved suggesting accurate placement of the medication.  Advised to call if fevers/chills, erythema, induration, drainage, or persistent bleeding.  Images permanently stored and available for review in the ultrasound unit.  Impression: Technically successful ultrasound guided injection.    Impression and Recommendations:     This case required medical decision making of moderate complexity.      Note: This dictation was prepared with Dragon dictation along with smaller phrase technology. Any transcriptional errors that result from this process are unintentional.

## 2016-09-15 ENCOUNTER — Encounter: Payer: Self-pay | Admitting: Family Medicine

## 2016-09-15 ENCOUNTER — Ambulatory Visit: Payer: Self-pay

## 2016-09-15 ENCOUNTER — Ambulatory Visit (INDEPENDENT_AMBULATORY_CARE_PROVIDER_SITE_OTHER): Payer: Managed Care, Other (non HMO) | Admitting: Family Medicine

## 2016-09-15 VITALS — BP 140/90 | HR 97 | Resp 16 | Wt 196.1 lb

## 2016-09-15 DIAGNOSIS — M7551 Bursitis of right shoulder: Secondary | ICD-10-CM | POA: Diagnosis not present

## 2016-09-15 DIAGNOSIS — M25519 Pain in unspecified shoulder: Secondary | ICD-10-CM | POA: Diagnosis not present

## 2016-09-15 NOTE — Patient Instructions (Signed)
Good to see you.  Ice 20 minutes 2 times daily. Usually after activity and before bed. Exercises 3 times a week.  pennsaid pinkie amount topically 2 times daily as needed.  Continue the vitamin D Keep hands within peripheral vision.  See me again in 4 weeks and if not better we can inject the [facility] joint.

## 2016-09-15 NOTE — Assessment & Plan Note (Signed)
Patient given injection today. Tolerated procedure well. We discussed icing regimen and home exercises. We discussed which activities to do a which ones to avoid. Increase activity as tolerated. Topical anti-implant was given. Follow-up again in 4-6 weeks.

## 2016-09-15 NOTE — Progress Notes (Signed)
Pre-visit discussion using our clinic review tool. No additional management support is needed unless otherwise documented below in the visit note.  

## 2016-09-19 ENCOUNTER — Ambulatory Visit (INDEPENDENT_AMBULATORY_CARE_PROVIDER_SITE_OTHER): Payer: Managed Care, Other (non HMO) | Admitting: Podiatry

## 2016-09-19 ENCOUNTER — Encounter: Payer: Self-pay | Admitting: Podiatry

## 2016-09-19 DIAGNOSIS — M205X9 Other deformities of toe(s) (acquired), unspecified foot: Secondary | ICD-10-CM | POA: Diagnosis not present

## 2016-09-19 DIAGNOSIS — M2041 Other hammer toe(s) (acquired), right foot: Secondary | ICD-10-CM

## 2016-09-22 NOTE — Progress Notes (Signed)
   Subjective: Patient is a 66 year old female presenting today for intermittent aching and numbness in all the toes of her right foot. Patient states she is walking on her toes. She has her inserts with her to show. 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    Objective/Physical Exam General: The patient is alert and oriented x3 in no acute distress.  Dermatology: Skin is warm, dry and supple bilateral lower extremities. Negative for open lesions or macerations.  Vascular: Palpable pedal pulses bilaterally. No edema or erythema noted. Capillary refill within normal limits.  Neurological: Epicritic and protective threshold grossly intact bilaterally.   Musculoskeletal Exam: Range of motion within normal limits to all pedal and ankle joints bilateral. Muscle strength 5/5 in all groups bilateral. Hammertoe contracture deformity noted to the right foot digits 2-5. 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 #2 appointment with [facility] for custom molded orthotics #3 toe crest pads dispensed #4 return to clinic when necessary   Felecia Shelling, DPM [facility]  Dr. Felecia Shelling, DPM    [address]                                        [place], [place] [address]                Office [phone]  Fax [phone]

## 2016-09-24 ENCOUNTER — Telehealth: Payer: Self-pay | Admitting: Internal Medicine

## 2016-09-24 NOTE — Telephone Encounter (Signed)
Reason for call: urinary urgency  Symptoms: urgency, dully achy back pain, took OTC , Duration 1 day Medications: Advil   Last seen for this problem: Seen by: Patient advised you were out of office today, No appointments available today.  Encouraged patient to go to walk in clinic however patient declined due to her work schedule.  She stated she would if her symptoms worsened.    [facility] will [facility] at lunch , wants you to be in loop of what's going on , since she had re-current UTI's  .

## 2016-09-24 NOTE — Telephone Encounter (Signed)
Pt called and stated that she is having another UTI. She is c/o urgency and dull achy, she stated that she took a urine test that you can get from the pharmacy and it did register uti. Please advise, thank you!  Call pt @ [phone]

## 2016-09-24 NOTE — Telephone Encounter (Signed)
Left voice mail to call back 

## 2016-09-25 ENCOUNTER — Encounter: Payer: Self-pay | Admitting: Physician Assistant

## 2016-09-25 ENCOUNTER — Ambulatory Visit: Payer: Self-pay | Admitting: Physician Assistant

## 2016-09-25 ENCOUNTER — Ambulatory Visit: Payer: Managed Care, Other (non HMO)

## 2016-09-25 VITALS — BP 141/89 | HR 73 | Temp 97.7°F

## 2016-09-25 DIAGNOSIS — N390 Urinary tract infection, site not specified: Secondary | ICD-10-CM

## 2016-09-25 LAB — POCT URINALYSIS DIPSTICK
Bilirubin, UA: NEGATIVE
Glucose, UA: NEGATIVE
KETONES UA: NEGATIVE
Nitrite, UA: NEGATIVE
PH UA: 6 (ref 5.0–8.0)
PROTEIN UA: NEGATIVE
RBC UA: NEGATIVE
SPEC GRAV UA: 1.015 (ref 1.010–1.025)
UROBILINOGEN UA: 0.2 U/dL

## 2016-09-25 MED ORDER — NITROFURANTOIN MONOHYD MACRO 100 MG PO CAPS: 100.0000 mg | ORAL_CAPSULE | Freq: Two times a day (BID) | ORAL | 0 refills | Status: DC

## 2016-09-25 NOTE — Progress Notes (Signed)
S:  C/o uti sx for several days, bladder pressure, urgency, frequency, just doesn't feel right in the pelvic area, also states she had 2 episodes where a white smelly discharge came out of the rectum while she was urinating, ?if this could cause the uti; denies vaginal discharge, abdominal pain or flank pain:  Remainder ros neg  O:  Vitals wnl, nad, no cva tenderness, back nontender, lungs c t a,cv rrr, , ua 2+ leuks  A: uti  P: macrobid  bid x 7d, increase water intake, add cranberry juice, return if not improving in 2 -3 days, return earlier if worsening, discussed pyelonephritis sx. Will add urine culture, f/u with dr Lorin Picket

## 2016-09-27 LAB — URINE CULTURE

## 2016-10-09 ENCOUNTER — Ambulatory Visit: Payer: Managed Care, Other (non HMO)

## 2016-10-14 NOTE — Progress Notes (Signed)
[name] [address] [place], [place] [address] Phone: [phone] Subjective:    I'm seeing this patient by the request  of:  Dale McMechen, MD   CC: Right shoulder pain f/u  BJY:NWGNFAOZHY  Dawn [facility] is a 66 y.o. female coming in with complaint of right shoulder pain. Patient was found to have more of a subacromial bursitis. Patient state shoulder. Patient denies any radiation down the arm. It is waking herup at night. Denies any weakness.     Past Medical History:  Diagnosis Date  . Allergic asthma   . Allergic rhinitis   . Arthritis   . Chronic back pain    s/p surgery for herniated disc 93/89)  . Chronic back pain   . Complication of anesthesia   . Frequent UTI   . GERD (gastroesophageal reflux disease)    pt. states no symptoms  . Heart murmur    history of  . History of bronchitis   . History of kidney stones   . History of shingles   . Hypertension   . Hypertension   . PONV (postoperative nausea and vomiting)    pt. states history of nausea in 1989  . Reactive airway disease   . Rotator cuff tear    left shoulder- pt. states healing   Past Surgical History:  Procedure Laterality Date  . BACK SURGERY    . CHOLECYSTECTOMY  August 25 2012  . COLONOSCOPY  2013  . ESOPHAGOGASTRODUODENOSCOPY  01/2011  . EYE SURGERY    . laporoscopy  08/22/1997  . VAGINAL HYSTERECTOMY  1999   laparoscopic assisted   Social History   Social History  . Marital status: Single    Spouse name: N/A  . Number of children: N/A  . Years of education: N/A   Social History Main Topics  . Smoking status: Never Smoker  . Smokeless tobacco: Never Used  . Alcohol use 0.0 oz/week     Comment: socially  . Drug use: No  . Sexual activity: Not Asked   Other Topics Concern  . None   Social History Narrative  . None   Allergies  Allergen Reactions  . Other Other (See Comments)    Smoke - severe reactive airway disease   . Benadryl  [Diphenhydramine Hcl] Hives  . Oxycodone Other (See Comments)    dizzy  . Beef-Derived Products Other (See Comments)    Gi upset  . Caladryl [Pramoxine-Calamine] Hives  . Clarithromycin Other (See Comments)    Gi upset  . Cleocin [Clindamycin Hcl] Other (See Comments)    GI upset   . Eggs Or Egg-Derived Products Diarrhea  . Hydrochlorothiazide Other (See Comments)    dizzy  . Soybean-Containing Drug Products Diarrhea  . Tomato Other (See Comments)    Small amount is okay, large amount causes diarrhea - Also potatoes   Family History  Problem Relation Age of Onset  . Dementia Father   . Prostate cancer Father   . Heart disease Mother     myocardial infarction, congestive heart failure  . Hypertension Mother   . Heart disease Brother     angioplasty  . Hypertension Brother   . Hypertension Sister   . Breast cancer Cousin     maternal side-50's  . Colon cancer Neg Hx     Past medical history, social, surgical and family history all reviewed in electronic medical record.  No pertanent information unless stated regarding to the chief complaint.  Review of Systems: No headache, visual changes, nausea, vomiting, diarrhea, constipation, dizziness, abdominal pain, skin rash, fevers, chills, night sweats, weight loss, swollen lymph nodes, body aches, joint swelling, muscle aches, chest pain, shortness of breath, mood changes.    Objective  Blood pressure 138/82, pulse 66, resp. rate 16, weight 191 lb 6 oz (86.8 kg), last menstrual period 05/21/1998, SpO2 99 %.   Systems examined below as of 10/15/16 General: NAD A&O x3 mood, affect normal  HEENT: Pupils equal, extraocular movements intact no nystagmus Respiratory: not short of breath at rest or with speaking Cardiovascular: No lower extremity edema, non tender Skin: Warm dry intact with no signs of infection or rash on extremities or on axial skeleton. Abdomen: Soft nontender, no masses Neuro: Cranial nerves  intact,  neurovascularly intact in all extremities with 2+ DTRs and 2+ pulses. Lymph: No lymphadenopathy appreciated today  Gait normal with good balance and coordination.  MSK: Non tender with full range of motion and good stability and symmetric strength and tone of  elbows, wrist,  knee hips and ankles bilaterally.  Arthritic changes of multiple joints \\Shoulder : Right Inspection reveals no abnormalities, atrophy or asymmetry. Palpation is normal with no tenderness over AC joint or bicipital groove. ROM is full in all planes passively. Rotator cuff strength normal throughout. Mild impingement still remaining Speeds and Yergason's tests normal. Positive crossover pain. Normal scapular function observed. No painful arc and no drop arm sign. No apprehension sign Contralateral shoulder unremarkable  Procedure: Real-time Ultrasound Guided Injection of right acromial clavicular joint Device: GE Logiq Q7 Ultrasound guided injection is preferred based studies that show increased duration, increased effect, greater accuracy, decreased procedural pain, increased response rate, and decreased cost with ultrasound guided versus blind injection.  Verbal informed consent obtained.  Time-out conducted.  Noted no overlying erythema, induration, or other signs of local infection.  Skin prepped in a sterile fashion.  Local anesthesia: Topical Ethyl chloride.  With sterile technique and under real time ultrasound guidance:  25-gauge half-inch no patient was injected in the right acromioclavicular joint with a total of 0.5 mL of 0.5% Marcaine and 0.5 mL of  0.5cc 40 mg/dL Completed without difficulty  Pain immediately resolved suggesting accurate placement of the medication.  Advised to call if fevers/chills, erythema, induration, drainage, or persistent bleeding.  Images permanently stored and available for review in the ultrasound unit.  Impression: Technically successful ultrasound guided injection.   Given  theraband for more resistance.    Impression and Recommendations:     This case required medical decision making of moderate complexity.      Note: This dictation was prepared with Dragon dictation along with smaller phrase technology. Any transcriptional errors that result from this process are unintentional.

## 2016-10-15 ENCOUNTER — Ambulatory Visit: Payer: Self-pay

## 2016-10-15 ENCOUNTER — Ambulatory Visit (INDEPENDENT_AMBULATORY_CARE_PROVIDER_SITE_OTHER): Payer: Managed Care, Other (non HMO) | Admitting: Family Medicine

## 2016-10-15 ENCOUNTER — Encounter: Payer: Self-pay | Admitting: Family Medicine

## 2016-10-15 VITALS — BP 138/82 | HR 66 | Resp 16 | Wt 191.4 lb

## 2016-10-15 DIAGNOSIS — M19011 Primary osteoarthritis, right shoulder: Secondary | ICD-10-CM | POA: Diagnosis not present

## 2016-10-15 DIAGNOSIS — M25511 Pain in right shoulder: Secondary | ICD-10-CM

## 2016-10-15 DIAGNOSIS — M75102 Unspecified rotator cuff tear or rupture of left shoulder, not specified as traumatic: Secondary | ICD-10-CM | POA: Diagnosis not present

## 2016-10-15 NOTE — Assessment & Plan Note (Signed)
Continue conservative, therapy. Worsening symptoms consider injection and physical therapy

## 2016-10-15 NOTE — Patient Instructions (Signed)
Good to see you  Dawn Vargas is your friend.  Take a couple days off from my exercises Then start again 3 times a week  Continue the vitamins and the cream.  See me again in 6 weeks and if shoulder still a little upset we can consider another injection.

## 2016-10-15 NOTE — Assessment & Plan Note (Signed)
Patient given injection today and  Tolerated it well. Good resolution of pain. Continue conservative therapy with home exercises. Return again in 6 weeks

## 2016-10-20 ENCOUNTER — Ambulatory Visit: Payer: Self-pay | Admitting: Physician Assistant

## 2016-10-20 ENCOUNTER — Encounter: Payer: Self-pay | Admitting: Physician Assistant

## 2016-10-20 VITALS — BP 139/80 | HR 63 | Temp 98.5°F

## 2016-10-20 DIAGNOSIS — R300 Dysuria: Secondary | ICD-10-CM

## 2016-10-20 DIAGNOSIS — N390 Urinary tract infection, site not specified: Secondary | ICD-10-CM

## 2016-10-20 LAB — POCT URINALYSIS DIPSTICK
BILIRUBIN UA: NEGATIVE
GLUCOSE UA: NEGATIVE
KETONES UA: NEGATIVE
Nitrite, UA: NEGATIVE
Protein, UA: NEGATIVE
RBC UA: NEGATIVE
SPEC GRAV UA: 1.01 (ref 1.010–1.025)
Urobilinogen, UA: 0.2 E.U./dL
pH, UA: 7 (ref 5.0–8.0)

## 2016-10-20 MED ORDER — NITROFURANTOIN MONOHYD MACRO 100 MG PO CAPS: 100.0000 mg | ORAL_CAPSULE | Freq: Two times a day (BID) | ORAL | 0 refills | Status: DC

## 2016-10-20 NOTE — Progress Notes (Signed)
S:  C/o uti sx for 2 days, lower abdominal pain, frequency, denies vaginal discharge, abdominal pain or flank pain:  Remainder ros neg; hx of multiple utis, [place] like to see a specialist in [place]  O:  Vitals wnl, nad, no cva tenderness, back nontender, lungs c t a,cv rrr, abd soft nontender, bs normal, n/v intact, ua trace leuks  A: uti  P: macrobid 100 mg bid x 7d, increase water intake, add cranberry juice, return if not improving in 2 -3 days, return earlier if worsening, discussed pyelonephritis sx, will refer to urology

## 2016-10-27 ENCOUNTER — Telehealth: Payer: Self-pay | Admitting: *Deleted

## 2016-10-27 NOTE — Telephone Encounter (Signed)
Patient has requested to see Dr.Scott this week, patient continues to have issues with having Uti's.  Please give a time and date . Pt contact [phone]

## 2016-10-27 NOTE — Telephone Encounter (Signed)
Called patient she states that she has been having UTI for years now. She states usually goes to walk in and gets treated. States that when you seen her in office last December felt better than she had until April when she started having another UTI. She does have appointment with Urology 6-28. She also is having trouble with ulcers and she is not sure what she can take with UTI issue. She was seen at walk in last Monday and was started on abx she has finished and is not having any help. Patient advised you are out of office and will not be able to answer until tomorrow.

## 2016-10-28 ENCOUNTER — Ambulatory Visit: Payer: Self-pay | Admitting: Physician Assistant

## 2016-10-28 VITALS — BP 160/98 | HR 69 | Temp 97.8°F

## 2016-10-28 DIAGNOSIS — N390 Urinary tract infection, site not specified: Secondary | ICD-10-CM

## 2016-10-28 DIAGNOSIS — R102 Pelvic and perineal pain: Secondary | ICD-10-CM

## 2016-10-28 LAB — POCT URINALYSIS DIPSTICK
BILIRUBIN UA: NEGATIVE
GLUCOSE UA: NEGATIVE
Ketones, UA: NEGATIVE
NITRITE UA: NEGATIVE
Protein, UA: NEGATIVE
RBC UA: NEGATIVE
Spec Grav, UA: 1.01 (ref 1.010–1.025)
Urobilinogen, UA: 0.2 E.U./dL
pH, UA: 7 (ref 5.0–8.0)

## 2016-10-28 MED ORDER — PHENAZOPYRIDINE HCL 200 MG PO TABS: 200.0000 mg | ORAL_TABLET | Freq: Three times a day (TID) | ORAL | 0 refills | Status: DC | PRN

## 2016-10-28 NOTE — Telephone Encounter (Signed)
Patient called back states that she is going to employee health to get u/a checked today. As far as "ulcer" pain she states she was seen by GI and [facility] the [facility]. She is having increased pain under left and right rib after eating. When she has bland food it is not at bad. She is not taking any otc meds for it and was not sure if you had any recommendation or if she needs to make app with GI

## 2016-10-28 NOTE — Telephone Encounter (Signed)
Ok

## 2016-10-28 NOTE — Telephone Encounter (Signed)
I've scheduled the patient.

## 2016-10-28 NOTE — Telephone Encounter (Signed)
I can try to work her in this week, but if she is having persistent symptoms with recurring uti's, she does need to see urology.  We can see about getting her in earlier for an appt.  What problems is she having with "ulcers"?  What symptoms?  Just need more information.  If no open spots this week, can see about working her in during lunch on Thursday 10/30/16

## 2016-10-28 NOTE — Telephone Encounter (Signed)
I have spoke to patient informed to be her on 5-24 arrive at 12:15 for 12:30 app with Dr. Lorin PicketScott. Can you put in for me?

## 2016-10-28 NOTE — Progress Notes (Signed)
S: pt here complaining of lower abdominal pain and low back pain, sx for a few weeks but became worse in last few days, states when she took omnicef she felt better on the last day of the medication, that was in December, denies fever, chills but states she did feel hot/cold this weekend, took some azo with a little relief, we made an appointment for her to see urology later in June as this was the first appointment available, states she had a total hysterectomy  O: vitals wnl, nad, pt is tearful, abd is soft tender across llq and rlq and at bladder area, bs normal all 4 quads, ua with trace leuks  A: pelvic pain  P: urine culture, explained to pt that we don't want to repeat antibiotics if possible due to the resistant strains, will notify her of culture and she should see her pcp for additional evaluation, rx for pyridium

## 2016-10-28 NOTE — Telephone Encounter (Signed)
Patient called back states that she went to employee health that they did a dip but after 14 days of abx it still showed trace of infection and she did not feel like it was something she could treat that she should be seen by you. Can I still add her to the 24 at lunch?

## 2016-10-30 ENCOUNTER — Encounter: Payer: Self-pay | Admitting: Internal Medicine

## 2016-10-30 ENCOUNTER — Ambulatory Visit: Payer: Self-pay | Admitting: Internal Medicine

## 2016-10-30 ENCOUNTER — Ambulatory Visit (INDEPENDENT_AMBULATORY_CARE_PROVIDER_SITE_OTHER): Payer: Managed Care, Other (non HMO) | Admitting: Internal Medicine

## 2016-10-30 VITALS — BP 144/98 | HR 62 | Temp 98.6°F | Wt 190.6 lb

## 2016-10-30 DIAGNOSIS — I10 Essential (primary) hypertension: Secondary | ICD-10-CM

## 2016-10-30 DIAGNOSIS — K219 Gastro-esophageal reflux disease without esophagitis: Secondary | ICD-10-CM | POA: Diagnosis not present

## 2016-10-30 DIAGNOSIS — N3000 Acute cystitis without hematuria: Secondary | ICD-10-CM

## 2016-10-30 DIAGNOSIS — N3090 Cystitis, unspecified without hematuria: Secondary | ICD-10-CM | POA: Diagnosis not present

## 2016-10-30 DIAGNOSIS — R109 Unspecified abdominal pain: Secondary | ICD-10-CM | POA: Diagnosis not present

## 2016-10-30 MED ORDER — ESOMEPRAZOLE MAGNESIUM 40 MG PO CPDR: 40.0000 mg | DELAYED_RELEASE_CAPSULE | Freq: Every day | ORAL | 1 refills | Status: DC

## 2016-10-30 MED ORDER — AMOXICILLIN 875 MG PO TABS: 875.0000 mg | ORAL_TABLET | Freq: Two times a day (BID) | ORAL | 0 refills | Status: DC

## 2016-10-30 NOTE — Progress Notes (Signed)
Patient ID: Dawn Vargas, female   DOB: 11/04/1950, 66 y.o.   MRN: [id number]   Subjective:    Patient ID: Dawn Vargas, female    DOB: 01/24/1951, 66 y.o.   MRN: [id number]  HPI  Patient here as a work in with concerns regarding persistent lower abdominal discomfort and some low back pain.  She has been having problems with recurring uti's.  Was evaluated 09/2016 at outside clinic.  Treated with macrobid.  Reevaluated recently and given another round of macrobid.  Was just evaluated 2 days ago and was given pyridium.  Has not taken.  Urine culture sent.  States she is having increased urinary frequency.  Some dysuria.  States symptoms feel similar to previous uti's.  Some queeziness.  No vomiting.  Decreased appetite.  She is eating and trying to stay hydrated.  Persistent lower abdominal discomfort related to above.  Reports increased gas.  No acid reflux.  Was questioning if return of ulcers.     Past Medical History:  Diagnosis Date  . Allergic asthma   . Allergic rhinitis   . Arthritis   . Chronic back pain    s/p surgery for herniated disc 93/89)  . Chronic back pain   . Complication of anesthesia   . Frequent UTI   . GERD (gastroesophageal reflux disease)    pt. states no symptoms  . Heart murmur    history of  . History of bronchitis   . History of kidney stones   . History of shingles   . Hypertension   . Hypertension   . PONV (postoperative nausea and vomiting)    pt. states history of nausea in 1989  . Reactive airway disease   . Rotator cuff tear    left shoulder- pt. states healing   Past Surgical History:  Procedure Laterality Date  . BACK SURGERY    . CHOLECYSTECTOMY  August 25 2012  . COLONOSCOPY  2013  . ESOPHAGOGASTRODUODENOSCOPY  01/2011  . EYE SURGERY    . laporoscopy  08/22/1997  . VAGINAL HYSTERECTOMY  1999   laparoscopic assisted   Family History  Problem Relation Age of Onset  . Dementia Father   . Prostate cancer Father   . Heart disease Mother         myocardial infarction, congestive heart failure  . Hypertension Mother   . Heart disease Brother        angioplasty  . Hypertension Brother   . Hypertension Sister   . Breast cancer Cousin        maternal side-50's  . Colon cancer Neg Hx    Social History   Social History  . Marital status: Single    Spouse name: N/A  . Number of children: N/A  . Years of education: N/A   Social History Main Topics  . Smoking status: Never Smoker  . Smokeless tobacco: Never Used  . Alcohol use 0.0 oz/week     Comment: socially  . Drug use: No  . Sexual activity: Not Asked   Other Topics Concern  . None   Social History Narrative  . None    Outpatient Encounter Prescriptions as of 10/30/2016  Medication Sig  . acetaminophen (TYLENOL ARTHRITIS PAIN) 650 MG CR tablet Take 1,300 mg by mouth at bedtime.  . B Complex-C (SUPER B COMPLEX PO) Take 1 tablet by mouth daily.  Marland Kitchen. CRANBERRY PO Take 1 capsule by mouth daily.  . Glucosamine-Chondroitin-MSM-D (TRIPLE FLEX/VITAMIN D3) TABS Take 1 tablet  by mouth daily.  Marland Kitchen losartan (COZAAR) 25 MG tablet TAKE ONE TABLET BY MOUTH TWICE DAILY .  NEEDS  OFFICE  VISIT  FOR  FURTHER  REFILLS.  Marland Kitchen Omega-3 Fatty Acids (FISH OIL PO) Take 1 capsule by mouth daily.  Marland Kitchen OVER THE COUNTER MEDICATION Take 500 mg by mouth daily. Tumeric 500mg   . Probiotic Product (PROBIOTIC PEARLS PO) Take 1 capsule by mouth daily.  Marland Kitchen amoxicillin (AMOXIL) 875 MG tablet Take 1 tablet (875 mg total) by mouth 2 (two) times daily.  Marland Kitchen esomeprazole (NEXIUM) 40 MG capsule Take 1 capsule (40 mg total) by mouth daily.  . phenazopyridine (PYRIDIUM) 200 MG tablet Take 1 tablet (200 mg total) by mouth 3 (three) times daily as needed for pain. (Patient not taking: Reported on 10/30/2016)   No facility-administered encounter medications on file as of 10/30/2016.     Review of Systems  Constitutional: Negative for fever.       Decreased appetite.    HENT: Negative for congestion.     Respiratory: Negative for cough, chest tightness and shortness of breath.   Cardiovascular: Negative for chest pain and leg swelling.  Gastrointestinal: Positive for abdominal pain and nausea. Negative for diarrhea and vomiting.  Genitourinary: Positive for dysuria, frequency and urgency. Negative for vaginal discharge.  Musculoskeletal: Positive for back pain. Negative for joint swelling.  Skin: Negative for color change and rash.  Neurological: Negative for dizziness, light-headedness and headaches.  Psychiatric/Behavioral: Negative for agitation and dysphoric mood.       Objective:    Physical Exam  Constitutional: She appears well-developed and well-nourished. No distress.  Neck: Neck supple.  Cardiovascular: Normal rate and regular rhythm.   Pulmonary/Chest: Breath sounds normal. No respiratory distress. She has no wheezes.  Abdominal: Soft. Bowel sounds are normal.  No significant tenderness to palpation.    Musculoskeletal: She exhibits no edema or tenderness.  Back - no significant tenderness to palpation.  No CVA tenderness.    Lymphadenopathy:    She has no cervical adenopathy.  Skin: No rash noted. No erythema.  Psychiatric: She has a normal mood and affect. Her behavior is normal.    BP (!) 144/98 (BP Location: Left Arm, Patient Position: Sitting, Cuff Size: Large)   Pulse 62   Temp 98.6 F (37 C)   Wt 190 lb 9.6 oz (86.5 kg)   LMP 05/21/1998   SpO2 98%   BMI 32.21 kg/m  Wt Readings from Last 3 Encounters:  10/30/16 190 lb 9.6 oz (86.5 kg)  10/15/16 191 lb 6 oz (86.8 kg)  09/15/16 196 lb 2 oz (89 kg)     Lab Results  Component Value Date   WBC 8.4 10/30/2016   HGB 15.1 (H) 10/30/2016   HCT 44.4 10/30/2016   PLT 225.0 10/30/2016   GLUCOSE 92 10/30/2016   CHOL 127 05/30/2016   TRIG 47.0 05/30/2016   HDL 61.20 05/30/2016   LDLCALC 56 05/30/2016   ALT 17 10/30/2016   AST 18 10/30/2016   NA 135 10/30/2016   K 4.3 10/30/2016   CL 98 10/30/2016    CREATININE 0.67 10/30/2016   BUN 9 10/30/2016   CO2 29 10/30/2016   TSH 1.05 05/30/2016   INR 0.98 02/22/2015   HGBA1C 5.4 04/25/2015       Assessment & Plan:   Problem List Items Addressed This Visit    GERD (gastroesophageal reflux disease)    No acid reflux symptoms.  History of gastric peptic ulcer.  Some increased  gas.  Start ranitidine.  Follow.  Treat uti.  Refer back to GI given persistent intermittent issues of lower abdominal pain and gas, etc.        Relevant Medications   esomeprazole (NEXIUM) 40 MG capsule   Other Relevant Orders   Ambulatory referral to Gastroenterology   Hypertension    Blood pressure elevated today, but has been better.  She is not feeling well today.  Feel may be aggravating the blood pressure.  Treat current infection and symptoms.  See if blood pressure improves.  Follow.        Recurrent cystitis    Has been worked up by urology previously.  CT negative.  Previous cystoscopy negative.  This has been a while since evaluation.  Given persistent reoccurring uti's and discomfort, will refer back to urology for evaluation.  She reports she has an appt in June.  No blood with this flare.  Previously noted blood with urinary symptoms.         Other Visit Diagnoses    Abdominal pain, unspecified abdominal location    -  Primary   Relevant Orders   CBC with Differential/Platelet (Completed)   Hepatic function panel (Completed)   Basic metabolic panel (Completed)   Amylase (Completed)   Lipase (Completed)   Ambulatory referral to Gastroenterology   Acute cystitis without hematuria       Acute symptoms now.  Reviewed urine culture results.  Preliminary - Enterococcus.  Treat with amoxicillin as directed.  Follow.  Urology evaluation as outlined.       Dale Tamarack, MD

## 2016-10-30 NOTE — Progress Notes (Signed)
Pre-visit discussion using our clinic review tool. No additional management support is needed unless otherwise documented below in the visit note.  

## 2016-10-31 LAB — BASIC METABOLIC PANEL
BUN: 9 mg/dL (ref 6–23)
CALCIUM: 10.2 mg/dL (ref 8.4–10.5)
CO2: 29 mEq/L (ref 19–32)
Chloride: 98 mEq/L (ref 96–112)
Creatinine, Ser: 0.67 mg/dL (ref 0.40–1.20)
GFR: 93.25 mL/min (ref >60.00)
Glucose, Bld: 92 mg/dL (ref 70–99)
Potassium: 4.3 mEq/L (ref 3.5–5.1)
SODIUM: 135 meq/L (ref 135–145)

## 2016-10-31 LAB — AMYLASE: Amylase: 32 U/L (ref 27–131)

## 2016-10-31 LAB — CBC WITH DIFFERENTIAL/PLATELET
BASOS PCT: 1.1 % (ref 0.0–3.0)
Basophils Absolute: 0.1 10*3/uL (ref 0.0–0.1)
EOS PCT: 1 % (ref 0.0–5.0)
Eosinophils Absolute: 0.1 10*3/uL (ref 0.0–0.7)
HCT: 44.4 % (ref 36.0–46.0)
Hemoglobin: 15.1 g/dL — ABNORMAL HIGH (ref 12.0–15.0)
LYMPHS ABS: 1.6 10*3/uL (ref 0.7–4.0)
Lymphocytes Relative: 18.5 % (ref 12.0–46.0)
MCHC: 34 g/dL (ref 30.0–36.0)
MCV: 90.5 fl (ref 78.0–100.0)
MONOS PCT: 9.9 % (ref 3.0–12.0)
Monocytes Absolute: 0.8 10*3/uL (ref 0.1–1.0)
NEUTROS PCT: 69.5 % (ref 43.0–77.0)
Neutro Abs: 5.9 10*3/uL (ref 1.4–7.7)
Platelets: 225 10*3/uL (ref 150.0–400.0)
RBC: 4.9 Mil/uL (ref 3.87–5.11)
RDW: 13.8 % (ref 11.5–15.5)
WBC: 8.4 10*3/uL (ref 4.0–10.5)

## 2016-10-31 LAB — HEPATIC FUNCTION PANEL
ALBUMIN: 4.8 g/dL (ref 3.5–5.2)
ALT: 17 U/L (ref 0–35)
AST: 18 U/L (ref 0–37)
Alkaline Phosphatase: 57 U/L (ref 39–117)
Bilirubin, Direct: 0.1 mg/dL (ref 0.0–0.3)
Total Bilirubin: 0.5 mg/dL (ref 0.2–1.2)
Total Protein: 7.4 g/dL (ref 6.0–8.3)

## 2016-10-31 LAB — LIPASE: LIPASE: 19 U/L (ref 11.0–59.0)

## 2016-10-31 LAB — URINE CULTURE

## 2016-11-02 ENCOUNTER — Telehealth: Payer: Self-pay | Admitting: Internal Medicine

## 2016-11-02 ENCOUNTER — Encounter: Payer: Self-pay | Admitting: Internal Medicine

## 2016-11-02 DIAGNOSIS — N3090 Cystitis, unspecified without hematuria: Secondary | ICD-10-CM

## 2016-11-02 DIAGNOSIS — R1030 Lower abdominal pain, unspecified: Secondary | ICD-10-CM

## 2016-11-02 NOTE — Telephone Encounter (Signed)
Pt notified of culture results.

## 2016-11-02 NOTE — Assessment & Plan Note (Signed)
Blood pressure elevated today, but has been better.  She is not feeling well today.  Feel may be aggravating the blood pressure.  Treat current infection and symptoms.  See if blood pressure improves.  Follow.

## 2016-11-02 NOTE — Assessment & Plan Note (Signed)
Has been worked up by urology previously.  CT negative.  Previous cystoscopy negative.  This has been a while since evaluation.  Given persistent reoccurring uti's and discomfort, will refer back to urology for evaluation.  She reports she has an appt in June.  No blood with this flare.  Previously noted blood with urinary symptoms.

## 2016-11-02 NOTE — Assessment & Plan Note (Signed)
No acid reflux symptoms.  History of gastric peptic ulcer.  Some increased gas.  Start ranitidine.  Follow.  Treat uti.  Refer back to GI given persistent intermittent issues of lower abdominal pain and gas, etc.

## 2016-11-05 NOTE — Addendum Note (Signed)
Addended by: Charm BargesSCOTT, Deidrick Rainey S on: 11/05/2016 03:41 PM   Modules accepted: Orders

## 2016-11-05 NOTE — Telephone Encounter (Signed)
Dr. Lorin PicketScott, Can you enter a referral for her? I can get her seen at [facility] sooner but they require a referral.

## 2016-11-05 NOTE — Telephone Encounter (Signed)
Order placed for urology referral.  

## 2016-11-10 ENCOUNTER — Other Ambulatory Visit (INDEPENDENT_AMBULATORY_CARE_PROVIDER_SITE_OTHER): Payer: Managed Care, Other (non HMO)

## 2016-11-10 ENCOUNTER — Telehealth: Payer: Self-pay | Admitting: Internal Medicine

## 2016-11-10 DIAGNOSIS — R739 Hyperglycemia, unspecified: Secondary | ICD-10-CM | POA: Diagnosis not present

## 2016-11-10 DIAGNOSIS — R300 Dysuria: Secondary | ICD-10-CM

## 2016-11-10 LAB — COMPREHENSIVE METABOLIC PANEL
ALT: 16 U/L (ref 0–35)
AST: 17 U/L (ref 0–37)
Albumin: 4.2 g/dL (ref 3.5–5.2)
Alkaline Phosphatase: 51 U/L (ref 39–117)
BUN: 9 mg/dL (ref 6–23)
CALCIUM: 9.5 mg/dL (ref 8.4–10.5)
CHLORIDE: 98 meq/L (ref 96–112)
CO2: 29 meq/L (ref 19–32)
Creatinine, Ser: 0.6 mg/dL (ref 0.40–1.20)
GFR: 105.91 mL/min (ref >60.00)
Glucose, Bld: 101 mg/dL — ABNORMAL HIGH (ref 70–99)
POTASSIUM: 4 meq/L (ref 3.5–5.1)
Sodium: 133 mEq/L — ABNORMAL LOW (ref 135–145)
Total Bilirubin: 0.5 mg/dL (ref 0.2–1.2)
Total Protein: 6.7 g/dL (ref 6.0–8.3)

## 2016-11-10 LAB — HEMOGLOBIN A1C: Hgb A1c MFr Bld: 5.5 % (ref 4.6–6.5)

## 2016-11-10 NOTE — Telephone Encounter (Signed)
I have placed the order for the lab.  Can you let lab know plan so that they are aware she will pick up

## 2016-11-10 NOTE — Telephone Encounter (Signed)
Just received and read her note.  She was questioning if she could leave another urine sample in the lab to see if her infection has cleared.  She is gone now.  She may have left one at the lab, but they have asked me for an order.  If she left a sample, let me know and I will order.  If not, I am ok if she wants to bring in a urine sample for me to run.  Also, let her know if she needs anything prior to her appt with urology, let us know.

## 2016-11-10 NOTE — Telephone Encounter (Signed)
Pt dropped off a note for Dr. Lorin PicketScott. Note is up front in Dr. Roby LoftsScott's color folder.

## 2016-11-10 NOTE — Telephone Encounter (Signed)
Lab informed.

## 2016-11-10 NOTE — Telephone Encounter (Signed)
In blue folder 

## 2016-11-10 NOTE — Telephone Encounter (Signed)
Called patient she will come by tonight to pick up cup and drop sample off in the morning on her way to work. Can you put in order for lab?

## 2016-11-11 ENCOUNTER — Encounter: Payer: Self-pay | Admitting: Internal Medicine

## 2016-11-11 ENCOUNTER — Other Ambulatory Visit: Payer: Self-pay | Admitting: Internal Medicine

## 2016-11-11 ENCOUNTER — Other Ambulatory Visit (INDEPENDENT_AMBULATORY_CARE_PROVIDER_SITE_OTHER): Payer: Managed Care, Other (non HMO)

## 2016-11-11 DIAGNOSIS — E871 Hypo-osmolality and hyponatremia: Secondary | ICD-10-CM

## 2016-11-11 DIAGNOSIS — R300 Dysuria: Secondary | ICD-10-CM

## 2016-11-11 LAB — URINALYSIS, ROUTINE W REFLEX MICROSCOPIC
Bilirubin Urine: NEGATIVE
Hgb urine dipstick: NEGATIVE
KETONES UR: NEGATIVE
NITRITE: NEGATIVE
PH: 7.5 (ref 5.0–8.0)
SPECIFIC GRAVITY, URINE: 1.01 (ref 1.000–1.030)
Total Protein, Urine: NEGATIVE
Urine Glucose: NEGATIVE
Urobilinogen, UA: 0.2 (ref 0.0–1.0)

## 2016-11-11 NOTE — Progress Notes (Signed)
Order placed for f/u sodium.  ?

## 2016-11-12 LAB — URINE CULTURE: ORGANISM ID, BACTERIA: NO GROWTH

## 2016-11-13 ENCOUNTER — Encounter: Payer: Self-pay | Admitting: Internal Medicine

## 2016-11-13 ENCOUNTER — Ambulatory Visit: Payer: Self-pay | Admitting: Internal Medicine

## 2016-11-17 ENCOUNTER — Other Ambulatory Visit (INDEPENDENT_AMBULATORY_CARE_PROVIDER_SITE_OTHER): Payer: Managed Care, Other (non HMO)

## 2016-11-17 DIAGNOSIS — E871 Hypo-osmolality and hyponatremia: Secondary | ICD-10-CM

## 2016-11-17 LAB — SODIUM: SODIUM: 134 meq/L — AB (ref 135–145)

## 2016-11-17 NOTE — Telephone Encounter (Signed)
Hard copy mailed  

## 2016-11-18 ENCOUNTER — Other Ambulatory Visit: Payer: Self-pay | Admitting: Internal Medicine

## 2016-11-18 DIAGNOSIS — E871 Hypo-osmolality and hyponatremia: Secondary | ICD-10-CM

## 2016-11-18 NOTE — Progress Notes (Signed)
Order placed for f/u sodium.  ?

## 2016-12-03 ENCOUNTER — Ambulatory Visit: Payer: Self-pay | Admitting: Urology

## 2016-12-09 ENCOUNTER — Ambulatory Visit (INDEPENDENT_AMBULATORY_CARE_PROVIDER_SITE_OTHER): Payer: Managed Care, Other (non HMO) | Admitting: Internal Medicine

## 2016-12-09 ENCOUNTER — Encounter: Payer: Self-pay | Admitting: Internal Medicine

## 2016-12-09 ENCOUNTER — Ambulatory Visit: Payer: Self-pay | Admitting: Family Medicine

## 2016-12-09 VITALS — BP 138/82 | HR 62 | Temp 98.6°F | Resp 12 | Ht 65.0 in | Wt 189.4 lb

## 2016-12-09 DIAGNOSIS — E669 Obesity, unspecified: Secondary | ICD-10-CM

## 2016-12-09 DIAGNOSIS — I10 Essential (primary) hypertension: Secondary | ICD-10-CM

## 2016-12-09 DIAGNOSIS — E871 Hypo-osmolality and hyponatremia: Secondary | ICD-10-CM

## 2016-12-09 DIAGNOSIS — M7551 Bursitis of right shoulder: Secondary | ICD-10-CM

## 2016-12-09 DIAGNOSIS — N3090 Cystitis, unspecified without hematuria: Secondary | ICD-10-CM

## 2016-12-09 DIAGNOSIS — W19XXXA Unspecified fall, initial encounter: Secondary | ICD-10-CM | POA: Diagnosis not present

## 2016-12-09 LAB — SODIUM: Sodium: 139 mEq/L (ref 135–145)

## 2016-12-09 NOTE — Progress Notes (Signed)
Pre-visit discussion using our clinic review tool. No additional management support is needed unless otherwise documented below in the visit note.  

## 2016-12-09 NOTE — Progress Notes (Signed)
Patient ID: Dawn Vargas, female   DOB: 04-01-1951, 66 y.o.   MRN: [id number]   Subjective:    Patient ID: Dawn Vargas, female    DOB: 1951-04-14, 66 y.o.   MRN: [id number]  HPI  Patient here for a scheduled follow up.  She was recently evaluated for reoccurring uti.  She recently was evaluated by Dr Raoul Pitch.  Was started on daily prophyolactic abx and vaginal estrogen.  She is feeling much better.  No abdominal pain.  No urinary symptoms.  Eating better.  Bowels moving.  Fell on 11/11/16.  Caught herself with arm full extended.  Also landed on her knees and hit the side/front of her head.  No residual headache.  no light headedness or dizziness.  Some shoulder discomfort, but states feels back to her baseline prior to her fall.  Some residual right knee discomfort if she goes down on her right knee.  No pain with walking.  No hip pain.     Past Medical History:  Diagnosis Date  . Allergic asthma   . Allergic rhinitis   . Arthritis   . Chronic back pain    s/p surgery for herniated disc 93/89)  . Chronic back pain   . Complication of anesthesia   . Frequent UTI   . GERD (gastroesophageal reflux disease)    pt. states no symptoms  . Heart murmur    history of  . History of bronchitis   . History of kidney stones   . History of shingles   . Hypertension   . Hypertension   . PONV (postoperative nausea and vomiting)    pt. states history of nausea in 1989  . Reactive airway disease   . Rotator cuff tear    left shoulder- pt. states healing   Past Surgical History:  Procedure Laterality Date  . BACK SURGERY    . CHOLECYSTECTOMY  August 25 2012  . COLONOSCOPY  2013  . ESOPHAGOGASTRODUODENOSCOPY  01/2011  . EYE SURGERY    . laporoscopy  08/22/1997  . VAGINAL HYSTERECTOMY  1999   laparoscopic assisted   Family History  Problem Relation Age of Onset  . Dementia Father   . Prostate cancer Father   . Heart disease Mother        myocardial infarction, congestive heart failure    . Hypertension Mother   . Heart disease Brother        angioplasty  . Hypertension Brother   . Hypertension Sister   . Breast cancer Cousin        maternal side-50's  . Colon cancer Neg Hx    Social History   Social History  . Marital status: Single    Spouse name: N/A  . Number of children: N/A  . Years of education: N/A   Social History Main Topics  . Smoking status: Never Smoker  . Smokeless tobacco: Never Used  . Alcohol use 0.0 oz/week     Comment: socially  . Drug use: No  . Sexual activity: Not Asked   Other Topics Concern  . None   Social History Narrative  . None    Outpatient Encounter Prescriptions as of 12/09/2016  Medication Sig  . acetaminophen (TYLENOL ARTHRITIS PAIN) 650 MG CR tablet Take 1,300 mg by mouth at bedtime.  . B Complex-C (SUPER B COMPLEX PO) Take 1 tablet by mouth daily.  . cephALEXin (KEFLEX) 250 MG capsule Take 250 mg by mouth daily.  Marland Kitchen conjugated estrogens (PREMARIN) vaginal cream  Place vaginally.  Marland Kitchen. CRANBERRY PO Take 1 capsule by mouth daily.  . Glucosamine-Chondroitin-MSM-D (TRIPLE FLEX/VITAMIN D3) TABS Take 1 tablet by mouth daily.  Marland Kitchen. losartan (COZAAR) 25 MG tablet TAKE ONE TABLET BY MOUTH TWICE DAILY .  . Omega-3 Fatty Acids (FISH OIL PO) Take 1 capsule by mouth daily.  Marland Kitchen. OVER THE COUNTER MEDICATION Take 500 mg by mouth daily. Tumeric 500mg   . Probiotic Product (PROBIOTIC PEARLS PO) Take 1 capsule by mouth daily.  Marland Kitchen. esomeprazole (NEXIUM) 40 MG capsule Take 1 capsule (40 mg total) by mouth daily. (Patient not taking: Reported on 12/09/2016)  . [DISCONTINUED] amoxicillin (AMOXIL) 875 MG tablet Take 1 tablet (875 mg total) by mouth 2 (two) times daily.  . [DISCONTINUED] losartan (COZAAR) 25 MG tablet TAKE ONE TABLET BY MOUTH TWICE DAILY .  NEEDS  OFFICE  VISIT  FOR  FURTHER  REFILLS.  . [DISCONTINUED] phenazopyridine (PYRIDIUM) 200 MG tablet Take 1 tablet (200 mg total) by mouth 3 (three) times daily as needed for pain. (Patient not taking:  Reported on 10/30/2016)   No facility-administered encounter medications on file as of 12/09/2016.     Review of Systems  Constitutional: Negative for appetite change and unexpected weight change.  HENT: Negative for congestion and sinus pressure.   Respiratory: Negative for cough, chest tightness and shortness of breath.   Cardiovascular: Negative for chest pain, palpitations and leg swelling.  Gastrointestinal: Negative for abdominal pain, diarrhea, nausea and vomiting.  Genitourinary: Negative for difficulty urinating and dysuria.  Musculoskeletal:       Some right shoulder pain - stable.  Some knee discomfort as outlined.   Skin: Negative for color change and rash.  Neurological: Negative for dizziness, light-headedness and headaches.  Psychiatric/Behavioral: Negative for agitation and dysphoric mood.       Objective:    Physical Exam  Constitutional: She appears well-developed and well-nourished. No distress.  HENT:  Nose: Nose normal.  Mouth/Throat: Oropharynx is clear and moist.  Neck: Neck supple. No thyromegaly present.  Cardiovascular: Normal rate and regular rhythm.   Pulmonary/Chest: Breath sounds normal. No respiratory distress. She has no wheezes.  Abdominal: Soft. Bowel sounds are normal. There is no tenderness.  Musculoskeletal: She exhibits no edema or tenderness.  Lymphadenopathy:    She has no cervical adenopathy.  Skin: No rash noted. No erythema.  Psychiatric: She has a normal mood and affect. Her behavior is normal.    BP 138/82 (BP Location: Left Arm, Patient Position: Sitting, Cuff Size: Normal)   Pulse 62   Temp 98.6 F (37 C) (Oral)   Resp 12   Ht 5\' 5"  (1.651 m)   Wt 189 lb 6.4 oz (85.9 kg)   LMP 05/21/1998   SpO2 98%   BMI 31.52 kg/m  Wt Readings from Last 3 Encounters:  12/09/16 189 lb 6.4 oz (85.9 kg)  10/30/16 190 lb 9.6 oz (86.5 kg)  10/15/16 191 lb 6 oz (86.8 kg)     Lab Results  Component Value Date   WBC 8.4 10/30/2016   HGB  15.1 (H) 10/30/2016   HCT 44.4 10/30/2016   PLT 225.0 10/30/2016   GLUCOSE 101 (H) 11/10/2016   CHOL 127 05/30/2016   TRIG 47.0 05/30/2016   HDL 61.20 05/30/2016   LDLCALC 56 05/30/2016   ALT 16 11/10/2016   AST 17 11/10/2016   NA 139 12/09/2016   K 4.0 11/10/2016   CL 98 11/10/2016   CREATININE 0.60 11/10/2016   BUN 9 11/10/2016  CO2 29 11/10/2016   TSH 1.05 05/30/2016   INR 0.98 02/22/2015   HGBA1C 5.5 11/10/2016    Mm Digital Screening Bilateral  Result Date: 08/06/2016 CLINICAL DATA:  Screening. EXAM: DIGITAL SCREENING BILATERAL MAMMOGRAM WITH CAD COMPARISON:  Previous exam(s). ACR Breast Density Category a: The breast tissue is almost entirely fatty. FINDINGS: There are no findings suspicious for malignancy. Images were processed with CAD. IMPRESSION: No mammographic evidence of malignancy. A result letter of this screening mammogram will be mailed directly to the patient. RECOMMENDATION: Screening mammogram in one year. (Code:SM-B-01Y) BI-RADS CATEGORY  1: Negative. Electronically Signed   By: Harmon Pier M.D.   On: 08/06/2016 10:18       Assessment & Plan:   Problem List Items Addressed This Visit    Hypertension    Blood pressure on outside checks ok.  Same medication regimen.  Follow pressures.  Follow metabolic panel.        Relevant Medications   losartan (COZAAR) 25 MG tablet   Obesity (BMI 30-39.9)    Discussed diet and exercise.  Follow.        Recurrent cystitis    Recently evaluated by urology.  See note.  On prophylactic abx.  Using estrogen cream.  Currently asymptomatic.        Subacromial bursitis of right shoulder joint    S/p previous injection.  Recent fall.  Appears to be back to her baseline.         Other Visit Diagnoses    Fall, initial encounter    -  Primary   Fall as outlined.  shoulder back to baseline.  some residual knee discomfort.  has f/u with ortho.     Hyponatremia           Dale Exeter, MD

## 2016-12-10 ENCOUNTER — Encounter: Payer: Self-pay | Admitting: Internal Medicine

## 2016-12-10 NOTE — Progress Notes (Signed)
[name] [address] [place], [place] [address] Phone: [phone] Subjective:    I'm seeing this patient by the request  of:  Dale Crab [place]chard, MD   CC: Right shoulder pain f/u  [id number]  Dawn Vargas is a 66 y.o. female coming in with complaint of right shoulder pain. Patient was seen previously 2 months ago and was given an injection in the acromioclavicular joint. Had an injection in the shoulder itself months ago. Patient was to continue with conservative therapy. Patient states Was doing significantly better but now having worsening symptoms after a fall. Patient is a G[employer] so. Mild weakness noted within now. Patient states that the pain is severe that is waking her up at night.       Past Medical History:  Diagnosis Date  . Allergic asthma   . Allergic rhinitis   . Arthritis   . Chronic back pain    s/p surgery for herniated disc 93/89)  . Chronic back pain   . Complication of anesthesia   . Frequent UTI   . GERD (gastroesophageal reflux disease)    pt. states no symptoms  . Heart murmur    history of  . History of bronchitis   . History of kidney stones   . History of shingles   . Hypertension   . Hypertension   . PONV (postoperative nausea and vomiting)    pt. states history of nausea in 1989  . Reactive airway disease   . Rotator cuff tear    left shoulder- pt. states healing   Past Surgical History:  Procedure Laterality Date  . BACK SURGERY    . CHOLECYSTECTOMY  August 25 2012  . COLONOSCOPY  2013  . ESOPHAGOGASTRODUODENOSCOPY  01/2011  . EYE SURGERY    . laporoscopy  08/22/1997  . VAGINAL HYSTERECTOMY  1999   laparoscopic assisted   Social History   Social History  . Marital status: Single    Spouse name: N/A  . Number of children: N/A  . Years of education: N/A   Social History Main Topics  . Smoking status: Never Smoker  . Smokeless tobacco: Never Used  . Alcohol use 0.0 oz/week     Comment:  socially  . Drug use: No  . Sexual activity: Not on file   Other Topics Concern  . Not on file   Social History Narrative  . No narrative on file   Allergies  Allergen Reactions  . Other Other (See Comments)    Smoke - severe reactive airway disease   . Benadryl [Diphenhydramine Hcl] Hives  . Oxycodone Other (See Comments)    dizzy  . Beef-Derived Products Other (See Comments)    Gi upset  . Caladryl [Pramoxine-Calamine] Hives  . Clarithromycin Other (See Comments)    Gi upset  . Cleocin [Clindamycin Hcl] Other (See Comments)    GI upset   . Eggs Or Egg-Derived Products Diarrhea  . Hydrochlorothiazide Other (See Comments)    dizzy  . Soybean-Containing Drug Products Diarrhea  . Tomato Other (See Comments)    Small amount is okay, large amount causes diarrhea - Also potatoes   Family History  Problem Relation Age of Onset  . Dementia Father   . Prostate cancer Father   . Heart disease Mother        myocardial infarction, congestive heart failure  . Hypertension Mother   . Heart disease Brother        angioplasty  .  Hypertension Brother   . Hypertension Sister   . Breast cancer Cousin        maternal side-50's  . Colon cancer Neg Hx     Past medical history, social, surgical and family history all reviewed in electronic medical record.  No pertanent information unless stated regarding to the chief complaint.   Review of Systems: No headache, visual changes, nausea, vomiting, diarrhea, constipation, dizziness, abdominal pain, skin rash, fevers, chills, night sweats, weight loss, swollen lymph nodes, body aches, joint swelling, muscle aches, chest pain, shortness of breath, mood changes.    Objective  Last menstrual period 05/21/1998.   Systems examined below as of 12/11/16 General: NAD A&O x3 mood, affect normal  HEENT: Pupils equal, extraocular movements intact no nystagmus Respiratory: not short of breath at rest or with speaking Cardiovascular: No lower  extremity edema, non tender Skin: Warm dry intact with no signs of infection or rash on extremities or on axial skeleton. Abdomen: Soft nontender, no masses Neuro: Cranial nerves  intact, neurovascularly intact in all extremities with 2+ DTRs and 2+ pulses. Lymph: No lymphadenopathy appreciated today  Gait normal with good balance and coordination.  MSK: Non tender with full range of motion and good stability and symmetric strength and tone of elbows, wrist,  knee hips and ankles bilaterally.  Arthritic changes of multiple joints Shoulder: Right Inspection reveals no abnormalities, atrophy or asymmetry. Diffuse tenderness Mild lack in external range of motion as well as internal range of motion to sacrum Rotator cuff strength 3 out of 5 which is worsening Positive impingement Speeds and Yergason's tests normal. Positive O'Brien's Normal scapular function observed. No painful arc and no drop arm sign. No apprehension sign Contralateral shoulder unremarkable.    MSK US performed of: Right shoulder This study was ordered, performed, and interpreted by Terrilee FilesZach Geneveive Furness D.O.  Shoulder:   Supraspinatus:  Large full-thickness tear with mild retraction noted Subscapularis:  Appears normal on long and transverse views. Overlying sisters hematoma noted AC joint:  Capsule undistended, no geyser sign. Glenohumeral Joint:  Small effusion noted. Glenoid Labrum:  Intact without visualized tears. Biceps Tendon:  Significant hypoechoic changes Impression: Full-thickness tear of the rotator cuff.  Procedure: Real-time Ultrasound Guided Injection of right glenohumeral joint Device: GE Logiq Q7  Ultrasound guided injection is preferred based studies that show increased duration, increased effect, greater accuracy, decreased procedural pain, increased response rate with ultrasound guided versus blind injection.  Verbal informed consent obtained.  Time-out conducted.  Noted no overlying erythema,  induration, or other signs of local infection.  Skin prepped in a sterile fashion.  Local anesthesia: Topical Ethyl chloride.  With sterile technique and under real time ultrasound guidance:  Joint visualized.  23g 1  inch needle inserted posterior approach. Pictures taken for needle placement. Patient did have injection of 2 cc of 1% lidocaine, 2 cc of 0.5% Marcaine, and 1.0 cc of Kenalog 40 mg/dL. Completed without difficulty  Pain immediately resolved suggesting accurate placement of the medication.  Advised to call if fevers/chills, erythema, induration, drainage, or persistent bleeding.  Images permanently stored and available for review in the ultrasound unit.  Impression: Technically successful ultrasound guided injection.   Impression and Recommendations:     This case required medical decision making of moderate complexity.      Note: This dictation was prepared with Dragon dictation along with smaller phrase technology. Any transcriptional errors that result from this process are unintentional.

## 2016-12-11 ENCOUNTER — Ambulatory Visit: Payer: Self-pay

## 2016-12-11 ENCOUNTER — Ambulatory Visit (INDEPENDENT_AMBULATORY_CARE_PROVIDER_SITE_OTHER): Payer: Managed Care, Other (non HMO) | Admitting: Family Medicine

## 2016-12-11 ENCOUNTER — Encounter: Payer: Self-pay | Admitting: Internal Medicine

## 2016-12-11 ENCOUNTER — Encounter: Payer: Self-pay | Admitting: Family Medicine

## 2016-12-11 VITALS — BP 142/88 | HR 60 | Ht 64.0 in | Wt 189.0 lb

## 2016-12-11 DIAGNOSIS — M75121 Complete rotator cuff tear or rupture of right shoulder, not specified as traumatic: Secondary | ICD-10-CM

## 2016-12-11 DIAGNOSIS — M75101 Unspecified rotator cuff tear or rupture of right shoulder, not specified as traumatic: Secondary | ICD-10-CM | POA: Insufficient documentation

## 2016-12-11 DIAGNOSIS — M25511 Pain in right shoulder: Secondary | ICD-10-CM

## 2016-12-11 DIAGNOSIS — G8929 Other chronic pain: Secondary | ICD-10-CM

## 2016-12-11 MED ORDER — LOSARTAN POTASSIUM 25 MG PO TABS: ORAL_TABLET | ORAL | 1 refills | Status: DC

## 2016-12-11 NOTE — Assessment & Plan Note (Signed)
Patient had what appeared to be a full-thickness tear of the rotator cuff. Discussed icing regimen and home exercises. We discussed which activities to do a which ones to avoid. Discussed worsening weakness we need to consider advanced imaging. Patient did have mild retraction but could be a surgical candidate if necessary. Patient is in agreement with plan.

## 2016-12-11 NOTE — Assessment & Plan Note (Signed)
Blood pressure on outside checks ok.  Same medication regimen.  Follow pressures.  Follow metabolic panel.

## 2016-12-11 NOTE — Assessment & Plan Note (Signed)
S/p previous injection.  Recent fall.  Appears to be back to her baseline.

## 2016-12-11 NOTE — Assessment & Plan Note (Signed)
Recently evaluated by urology.  See note.  On prophylactic abx.  Using estrogen cream.  Currently asymptomatic.

## 2016-12-11 NOTE — Assessment & Plan Note (Signed)
Discussed diet and exercise.  Follow.  

## 2016-12-11 NOTE — Patient Instructions (Signed)
Good to see you Dawn Vargas is your friend.  Arnica lotion 2 times a day  Injected the shoulder and we will need to watch it  See me again in 3-4 weeks

## 2017-01-08 ENCOUNTER — Encounter: Payer: Self-pay | Admitting: Family Medicine

## 2017-01-08 ENCOUNTER — Ambulatory Visit (INDEPENDENT_AMBULATORY_CARE_PROVIDER_SITE_OTHER): Payer: Managed Care, Other (non HMO) | Admitting: Family Medicine

## 2017-01-08 VITALS — BP 128/82 | HR 62 | Ht 64.0 in | Wt 189.0 lb

## 2017-01-08 DIAGNOSIS — M75111 Incomplete rotator cuff tear or rupture of right shoulder, not specified as traumatic: Secondary | ICD-10-CM | POA: Diagnosis not present

## 2017-01-08 NOTE — Assessment & Plan Note (Signed)
Doing relatively well. Does notice some increase in strength from previous exam. Discussed with patient about icing regimen. We'll start with physical therapy. Worsening symptoms we will have patient get advance imaging. Patient will follow-up with me again in 4-6 weeks.

## 2017-01-08 NOTE — Progress Notes (Signed)
[facility]awana Scale[facility] Smith D.O. [facility] [address] [place], [place] [address] Phone: [phone] Subjective:    I'm seeing this patient by the request  of:  [name], [name], MD   CC: Right shoulder pain f/u  [id number]:Subjective  Dawn Vargas is a 66 y.o. female coming in with complaint of right shoulder pain. Patient was seen previously 2 months ago and was given an injection in the acromioclavicular joint. Found to have also had full rotator cuff tear. Had an injection in the shoulder itself months ago. Patient was to continue with conservative therapy. Patient states that she is making improvement. Not having as much severe pain. Noticing increasing strength as well. Trying to do the exercises regularly. States that she is 60-70% better.     Past Medical History:  Diagnosis Date  . Allergic asthma   . Allergic rhinitis   . Arthritis   . Chronic back pain    s/p surgery for herniated disc 93/89)  . Chronic back pain   . Complication of anesthesia   . Frequent UTI   . GERD (gastroesophageal reflux disease)    pt. states no symptoms  . Heart murmur    history of  . History of bronchitis   . History of kidney stones   . History of shingles   . Hypertension   . Hypertension   . PONV (postoperative nausea and vomiting)    pt. states history of nausea in 1989  . Reactive airway disease   . Rotator cuff tear    left shoulder- pt. states healing   Past Surgical History:  Procedure Laterality Date  . BACK SURGERY    . CHOLECYSTECTOMY  August 25 2012  . COLONOSCOPY  2013  . ESOPHAGOGASTRODUODENOSCOPY  01/2011  . EYE SURGERY    . laporoscopy  08/22/1997  . VAGINAL HYSTERECTOMY  1999   laparoscopic assisted   Social History   Social History  . Marital status: Single    Spouse name: N/A  . Number of children: N/A  . Years of education: N/A   Social History Main Topics  . Smoking status: Never Smoker  . Smokeless tobacco: Never Used  . Alcohol use 0.0 oz/week   Comment: socially  . Drug use: No  . Sexual activity: Not Asked   Other Topics Concern  . None   Social History Narrative  . None   Allergies  Allergen Reactions  . Other Other (See Comments)    Smoke - severe reactive airway disease   . Benadryl [Diphenhydramine Hcl] Hives  . Oxycodone Other (See Comments)    dizzy  . Beef-Derived Products Other (See Comments)    Gi upset  . Caladryl [Pramoxine-Calamine] Hives  . Clarithromycin Other (See Comments)    Gi upset  . Cleocin [Clindamycin Hcl] Other (See Comments)    GI upset   . Eggs Or Egg-Derived Products Diarrhea  . Hydrochlorothiazide Other (See Comments)    dizzy  . Soybean-Containing Drug Products Diarrhea  . Tomato Other (See Comments)    Small amount is okay, large amount causes diarrhea - Also potatoes   Family History  Problem Relation Age of Onset  . Dementia Father   . Prostate cancer Father   . Heart disease Mother        myocardial infarction, congestive heart failure  . Hypertension Mother   . Heart disease Brother        angioplasty  . Hypertension Brother   . Hypertension Sister   .  Breast cancer Cousin        maternal side-50's  . Colon cancer Neg Hx     Past medical history, social, surgical and family history all reviewed in electronic medical record.  No pertanent information unless stated regarding to the chief complaint.   Review of Systems: No headache, visual changes, nausea, vomiting, diarrhea, constipation, dizziness, abdominal pain, skin rash, fevers, chills, night sweats, weight loss, swollen lymph nodes, body aches, joint swelling, muscle aches, chest pain, shortness of breath, mood changes.     Objective  Height 5\' 4"  (1.626 m), weight 189 lb (85.7 kg), last menstrual period 05/21/1998.   Systems examined below as of 01/08/17 General: NAD A&O x3 mood, affect normal  HEENT: Pupils equal, extraocular movements intact no nystagmus Respiratory: not short of breath at rest or with  speaking Cardiovascular: No lower extremity edema, non tender Skin: Warm dry intact with no signs of infection or rash on extremities or on axial skeleton. Abdomen: Soft nontender, no masses Neuro: Cranial nerves  intact, neurovascularly intact in all extremities with 2+ DTRs and 2+ pulses. Lymph: No lymphadenopathy appreciated today  Gait normal with good balance and coordination.  MSK: Non tender with full range of motion and good stability and symmetric strength and tone of elbows, wrist,  knee hips and ankles bilaterally.   Shoulder: Right Inspection reveals no abnormalities, atrophy or asymmetry. Palpation is normal with no tenderness over AC joint or bicipital groove. ROM is full in all planes. 4 out of 5 strength compared to the contralateral side Mild impingement Speeds and Yergason's tests normal. Positive O'Brien's Normal scapular function observed. No painful arc and no drop arm sign. No apprehension sign Contralateral shoulder unremarkable      Impression and Recommendations:     This case required medical decision making of moderate complexity.      Note: This dictation was prepared with Dragon dictation along with smaller phrase technology. Any transcriptional errors that result from this process are unintentional.

## 2017-01-08 NOTE — Patient Instructions (Signed)
Good to see you  Dawn HoopsYu are doing great  PT will be call you  Keep up everything else pennsaid pinkie amount topically 2 times daily as needed.  See me again in 6 weeks

## 2017-02-24 ENCOUNTER — Ambulatory Visit: Payer: Self-pay

## 2017-02-24 ENCOUNTER — Ambulatory Visit (INDEPENDENT_AMBULATORY_CARE_PROVIDER_SITE_OTHER): Payer: Managed Care, Other (non HMO) | Admitting: Family Medicine

## 2017-02-24 VITALS — BP 140/70 | HR 77 | Ht 65.0 in | Wt 193.0 lb

## 2017-02-24 DIAGNOSIS — G8929 Other chronic pain: Secondary | ICD-10-CM

## 2017-02-24 DIAGNOSIS — M75111 Incomplete rotator cuff tear or rupture of right shoulder, not specified as traumatic: Secondary | ICD-10-CM | POA: Diagnosis not present

## 2017-02-24 DIAGNOSIS — M25511 Pain in right shoulder: Secondary | ICD-10-CM | POA: Diagnosis not present

## 2017-02-24 MED ORDER — TIZANIDINE HCL 4 MG PO TABS: 4.0000 mg | ORAL_TABLET | Freq: Every evening | ORAL | 2 refills | Status: AC

## 2017-02-24 NOTE — Assessment & Plan Note (Addendum)
Injected again due to worsening symptoms. Tolerated the procedure well. We discussed icing regimen and home exercises. Patient will start increasing activity as tolerated. Patient knows if worsening symptoms or no improvement MR arthrogram would be necessary. Patient is in agreement with the plan. Has her any failed all other conservative therapy.

## 2017-02-24 NOTE — Patient Instructions (Signed)
Good to see you  Ice 20 minutes 2 times daily. Usually after activity and before bed. Exercises 3 times a week.  Continue the vitamins See me again in 4 weeks and if not better we will consider MRI.

## 2017-02-24 NOTE — Progress Notes (Signed)
[name] [address] [place], [place] [address] Phone: [phone] Subjective:    I'm seeing this patient by the request  of:    CC:   [id number]  Dawn Vargas is a 66 y.o. female coming in with complaint of right shoulder pain. Says her shoulder has been worse. Says the Cortizone shot in July worked for her however in August the pain reappeared. She has discontinued the therapy because of the pain. Says she is in a lot of pain and the pain is dull achy. Says the pain keeps her up at night (sleeps about 3 hours). Pain is both anterior and posterior shoulder.  Patient is had difficulty with this previously and worsening symptoms overall.     Past Medical History:  Diagnosis Date  . Allergic asthma   . Allergic rhinitis   . Arthritis   . Chronic back pain    s/p surgery for herniated disc 93/89)  . Chronic back pain   . Complication of anesthesia   . Frequent UTI   . GERD (gastroesophageal reflux disease)    pt. states no symptoms  . Heart murmur    history of  . History of bronchitis   . History of kidney stones   . History of shingles   . Hypertension   . Hypertension   . PONV (postoperative nausea and vomiting)    pt. states history of nausea in 1989  . Reactive airway disease   . Rotator cuff tear    left shoulder- pt. states healing   Past Surgical History:  Procedure Laterality Date  . BACK SURGERY    . CHOLECYSTECTOMY  August 25 2012  . COLONOSCOPY  2013  . ESOPHAGOGASTRODUODENOSCOPY  01/2011  . EYE SURGERY    . laporoscopy  08/22/1997  . VAGINAL HYSTERECTOMY  1999   laparoscopic assisted   Social History   Social History  . Marital status: Single    Spouse name: N/A  . Number of children: N/A  . Years of education: N/A   Social History Main Topics  . Smoking status: Never Smoker  . Smokeless tobacco: Never Used  . Alcohol use 0.0 oz/week     Comment: socially  . Drug use: No  . Sexual activity: Not on file     Other Topics Concern  . Not on file   Social History Narrative  . No narrative on file   Allergies  Allergen Reactions  . Other Other (See Comments)    Smoke - severe reactive airway disease   . Benadryl [Diphenhydramine Hcl] Hives  . Oxycodone Other (See Comments)    dizzy  . Beef-Derived Products Other (See Comments)    Gi upset  . Caladryl [Pramoxine-Calamine] Hives  . Clarithromycin Other (See Comments)    Gi upset  . Cleocin [Clindamycin Hcl] Other (See Comments)    GI upset   . Eggs Or Egg-Derived Products Diarrhea  . Hydrochlorothiazide Other (See Comments)    dizzy  . Soybean-Containing Drug Products Diarrhea  . Tomato Other (See Comments)    Small amount is okay, large amount causes diarrhea - Also potatoes   Family History  Problem Relation Age of Onset  . Dementia Father   . Prostate cancer Father   . Heart disease Mother        myocardial infarction, congestive heart failure  . Hypertension Mother   . Heart disease Brother        angioplasty  . Hypertension Brother   .  Hypertension Sister   . Breast cancer Cousin        maternal side-50's  . Colon cancer Neg Hx      Past medical history, social, surgical and family history all reviewed in electronic medical record.  No pertanent information unless stated regarding to the chief complaint.   Review of Systems: No headache, visual changes, nausea, vomiting, diarrhea, constipation, dizziness, abdominal pain, skin rash, fevers, chills, night sweats, weight loss, swollen lymph nodes, body aches, joint swelling, muscle aches, chest pain, shortness of breath, mood changes.    Objective  Last menstrual period 05/21/1998. Systems examined below as of 02/24/17   General: No apparent distress alert and oriented x3 mood and affect normal, dressed appropriately.  HEENT: Pupils equal, extraocular movements intact  Respiratory: Patient's speak in full sentences and does not appear short of breath   Cardiovascular: No lower extremity edema, non tender, no erythema  Skin: Warm dry intact with no signs of infection or rash on extremities or on axial skeleton.  Abdomen: Soft nontender  Neuro: Cranial nerves II through XII are intact, neurovascularly intact in all extremities with 2+ DTRs and 2+ pulses.  Lymph: No lymphadenopathy of posterior or anterior cervical chain or axillae bilaterally.  Gait normal with good balance and coordination.  MSK:  Non tender with full range of motion and good stability and symmetric strength and tone of  elbows, wrist, hip, knee and ankles bilaterally. Mild arthritic changes of multiple joints  Shoulder: Right Mild atrophy noted Palpation is normal with no tenderness over AC joint or bicipital groove. ROM is full in all planes passively. Rotator cuff strength 4 out of 5 compared to contralateral sign signs of impingement with positive Neer and Hawkin's tests, but negative empty can sign. Speeds and Yergason's tests normal. Positive O'Brien's Normal scapular function observed. Painful arc but negative drop arm sign No apprehension sign Contralateral shoulder unremarkable   Procedure: Real-time Ultrasound Guided Injection of right glenohumeral joint Device: GE Logiq E  Ultrasound guided injection is preferred based studies that show increased duration, increased effect, greater accuracy, decreased procedural pain, increased response rate with ultrasound guided versus blind injection.  Verbal informed consent obtained.  Time-out conducted.  Noted no overlying erythema, induration, or other signs of local infection.  Skin prepped in a sterile fashion.  Local anesthesia: Topical Ethyl chloride.  With sterile technique and under real time ultrasound guidance:  Joint visualized.  23g 1  inch needle inserted posterior approach. Pictures taken for needle placement. Patient did have injection of 2 cc of 1% lidocaine, 2 cc of 0.5% Marcaine, and 1.0 cc of  Kenalog 40 mg/dL. Completed without difficulty  Pain immediately resolved suggesting accurate placement of the medication.  Advised to call if fevers/chills, erythema, induration, drainage, or persistent bleeding.  Images permanently stored and available for review in the ultrasound unit.  Impression: Technically successful ultrasound guided injection.     Impression and Recommendations:     This case required medical decision making of moderate complexity.      Note: This dictation was prepared with Dragon dictation along with smaller phrase technology. Any transcriptional errors that result from this process are unintentional.

## 2017-03-17 ENCOUNTER — Ambulatory Visit: Payer: Self-pay | Admitting: Internal Medicine

## 2017-03-24 ENCOUNTER — Ambulatory Visit (INDEPENDENT_AMBULATORY_CARE_PROVIDER_SITE_OTHER): Payer: Managed Care, Other (non HMO) | Admitting: Family Medicine

## 2017-03-24 ENCOUNTER — Encounter: Payer: Self-pay | Admitting: Family Medicine

## 2017-03-24 DIAGNOSIS — M75111 Incomplete rotator cuff tear or rupture of right shoulder, not specified as traumatic: Secondary | ICD-10-CM

## 2017-03-24 NOTE — Assessment & Plan Note (Signed)
Significant improvement after last injection. Continue conservative therapy. Patient follow-up as needed.

## 2017-03-24 NOTE — Progress Notes (Signed)
[name] [address] [place], [place] [address] Phone: [phone] Subjective:     CC: Right shoulder pain follow-up  WGN:[id number]  Dawn Vargas is a 66 y.o. female coming in with complaint of right shoulder pain. Patient was found to have more of a small tear of the rotator cuff as well as a subacromial bursitis. Patient was given an injection at last exam. Patient was doing significantly improved. Patient states 100% percent better.    Past Medical History:  Diagnosis Date  . Allergic asthma   . Allergic rhinitis   . Arthritis   . Chronic back pain    s/p surgery for herniated disc 93/89)  . Chronic back pain   . Complication of anesthesia   . Frequent UTI   . GERD (gastroesophageal reflux disease)    pt. states no symptoms  . Heart murmur    history of  . History of bronchitis   . History of kidney stones   . History of shingles   . Hypertension   . Hypertension   . PONV (postoperative nausea and vomiting)    pt. states history of nausea in 1989  . Reactive airway disease   . Rotator cuff tear    left shoulder- pt. states healing   Past Surgical History:  Procedure Laterality Date  . BACK SURGERY    . CHOLECYSTECTOMY  August 25 2012  . COLONOSCOPY  2013  . ESOPHAGOGASTRODUODENOSCOPY  01/2011  . EYE SURGERY    . laporoscopy  08/22/1997  . VAGINAL HYSTERECTOMY  1999   laparoscopic assisted   Social History   Social History  . Marital status: Single    Spouse name: N/A  . Number of children: N/A  . Years of education: N/A   Social History Main Topics  . Smoking status: Never Smoker  . Smokeless tobacco: Never Used  . Alcohol use 0.0 oz/week     Comment: socially  . Drug use: No  . Sexual activity: Not Asked   Other Topics Concern  . None   Social History Narrative  . None   Allergies  Allergen Reactions  . Other Other (See Comments)    Smoke - severe reactive airway disease   . Benadryl [Diphenhydramine  Hcl] Hives  . Oxycodone Other (See Comments)    dizzy  . Beef-Derived Products Other (See Comments)    Gi upset  . Caladryl [Pramoxine-Calamine] Hives  . Clarithromycin Other (See Comments)    Gi upset  . Cleocin [Clindamycin Hcl] Other (See Comments)    GI upset   . Eggs Or Egg-Derived Products Diarrhea  . Hydrochlorothiazide Other (See Comments)    dizzy  . Soybean-Containing Drug Products Diarrhea  . Tomato Other (See Comments)    Small amount is okay, large amount causes diarrhea - Also potatoes   Family History  Problem Relation Age of Onset  . Dementia Father   . Prostate cancer Father   . Heart disease Mother        myocardial infarction, congestive heart failure  . Hypertension Mother   . Heart disease Brother        angioplasty  . Hypertension Brother   . Hypertension Sister   . Breast cancer Cousin        maternal side-50's  . Colon cancer Neg Hx      Past medical history, social, surgical and family history all reviewed in electronic medical record.  No pertanent information unless stated regarding to  the chief complaint.   Review of Systems:Review of systems updated and as accurate as of 03/24/17  No headache, visual changes, nausea, vomiting, diarrhea, constipation, dizziness, abdominal pain, skin rash, fevers, chills, night sweats, weight loss, swollen lymph nodes, body aches, joint swelling, chest pain, shortness of breath, mood changes. Positive muscle aches  Objective  Blood pressure (!) 160/90, pulse (!) 59, height  (1.651 m), weight 193 lb (87.5 kg), last menstrual period 05/21/1998, SpO2 97 %. Systems examined below as of 03/24/17   General: No apparent distress alert and oriented x3 mood and affect normal, dressed appropriately.  HEENT: Pupils equal, extraocular movements intact  Respiratory: Patient's speak in full sentences and does not appear short of breath  Cardiovascular: No lower extremity edema, non tender, no erythema  Skin: Warm dry  intact with no signs of infection or rash on extremities or on axial skeleton.  Abdomen: Soft nontender  Neuro: Cranial nerves II through XII are intact, neurovascularly intact in all extremities with 2+ DTRs and 2+ pulses.  Lymph: No lymphadenopathy of posterior or anterior cervical chain or axillae bilaterally.  Gait normal with good balance and coordination.  MSK:  Non tender with full range of motion and good stability and symmetric strength and tone of elbows, wrist, hip, knee and ankles bilaterally. Arthritis changes  Shoulder: Right Inspection reveals no abnormalities, atrophy or asymmetry. Palpation is normal with no tenderness over AC joint or bicipital groove. ROM is full in all planes. Rotator cuff strength normal throughout. Very mild impingement noted Speeds and Yergason's tests normal. No labral pathology noted with negative Obrien's, negative clunk and good stability. Normal scapular function observed. No painful arc and no drop arm sign. No apprehension sign Contralateral shoulder unremarkable      Impression and Recommendations:     This case required medical decision making of moderate complexity.      Note: This dictation was prepared with Dragon dictation along with smaller phrase technology. Any transcriptional errors that result from this process are unintentional.

## 2017-05-26 ENCOUNTER — Other Ambulatory Visit: Payer: Self-pay | Admitting: Internal Medicine

## 2017-06-15 ENCOUNTER — Ambulatory Visit (INDEPENDENT_AMBULATORY_CARE_PROVIDER_SITE_OTHER): Payer: Managed Care, Other (non HMO) | Admitting: Internal Medicine

## 2017-06-15 ENCOUNTER — Encounter: Payer: Self-pay | Admitting: Internal Medicine

## 2017-06-15 VITALS — BP 144/82 | HR 96 | Temp 98.5°F | Ht 65.0 in | Wt 190.6 lb

## 2017-06-15 DIAGNOSIS — G8929 Other chronic pain: Secondary | ICD-10-CM

## 2017-06-15 DIAGNOSIS — M545 Low back pain, unspecified: Secondary | ICD-10-CM

## 2017-06-15 DIAGNOSIS — M7070 Other bursitis of hip, unspecified hip: Secondary | ICD-10-CM | POA: Diagnosis not present

## 2017-06-15 DIAGNOSIS — I10 Essential (primary) hypertension: Secondary | ICD-10-CM | POA: Diagnosis not present

## 2017-06-15 DIAGNOSIS — N3090 Cystitis, unspecified without hematuria: Secondary | ICD-10-CM | POA: Diagnosis not present

## 2017-06-15 DIAGNOSIS — E669 Obesity, unspecified: Secondary | ICD-10-CM | POA: Diagnosis not present

## 2017-06-15 DIAGNOSIS — M7551 Bursitis of right shoulder: Secondary | ICD-10-CM

## 2017-06-15 DIAGNOSIS — Z0000 Encounter for general adult medical examination without abnormal findings: Secondary | ICD-10-CM

## 2017-06-15 DIAGNOSIS — M2550 Pain in unspecified joint: Secondary | ICD-10-CM

## 2017-06-15 NOTE — Progress Notes (Signed)
Pre visit review using our clinic review tool, if applicable. No additional management support is needed unless otherwise documented below in the visit note. 

## 2017-06-15 NOTE — Assessment & Plan Note (Addendum)
Physical was scheduled for today 06/15/17. She decline.  Is s/p hysterectomy.  Mammogram 08/06/16 - Birads I.  Scheduled for f/u mammogram.  Colonoscopy 04/30/12 - one polyp sigmoid colon and redundant colon.  Was scheduled for f/u colonoscopy and cancelled.  PAP 05/29/16.

## 2017-06-15 NOTE — Progress Notes (Signed)
Patient ID: Dawn Vargas, female   DOB: 10-May-1951, 67 y.o.   MRN: [id number]   Subjective:    Patient ID: Dawn Vargas, female    DOB: [birth year], 67 y.o.   MRN: [id number]  HPI  Patient here for her physical exam.  She desired not to have breast exam, etc.  She reports that overall she is doing better and feels better.  Seeing urology.  Off keflex.  Was on for suppression.  No recurring UTI recently.  Has f/u planned 07/23/17.  No urinary symptoms.  Bowels doing better.  No abdominal pain.  Did have rotator cuff tear.  Has been seeing Dr Katrinka Blazing.  S/p injection.  Was better.  Started exercising.  Noticed some increased pain in her right shoulder.  Doing exercises.  Helps.  Also some right hip discomfort.  Has a history of bursitis.  Flared recently after power walking.  Initially took antiinflammatories.  Off now.  Is better.  Again discussed stretches and exercise.  Desires no further intervention at this time.  No chest pain.  No sob.  No acid reflux.  Back is better.  States blood pressures on outside checks - averaging 130s systolic readings.     Past Medical History:  Diagnosis Date  . Allergic asthma   . Allergic rhinitis   . Arthritis   . Chronic back pain    s/p surgery for herniated disc 93/89)  . Chronic back pain   . Complication of anesthesia   . Frequent UTI   . GERD (gastroesophageal reflux disease)    pt. states no symptoms  . Heart murmur    history of  . History of bronchitis   . History of kidney stones   . History of shingles   . Hypertension   . Hypertension   . PONV (postoperative nausea and vomiting)    pt. states history of nausea in 1989  . Reactive airway disease   . Rotator cuff tear    left shoulder- pt. states healing   Past Surgical History:  Procedure Laterality Date  . BACK SURGERY    . CHOLECYSTECTOMY  August 25 2012  . COLONOSCOPY  2013  . ESOPHAGOGASTRODUODENOSCOPY  01/2011  . EYE SURGERY    . laporoscopy  08/22/1997  . VAGINAL HYSTERECTOMY   1999   laparoscopic assisted   Family History  Problem Relation Age of Onset  . Dementia Father   . Prostate cancer Father   . Heart disease Mother        myocardial infarction, congestive heart failure  . Hypertension Mother   . Heart disease Brother        angioplasty  . Hypertension Brother   . Hypertension Sister   . Breast cancer Cousin        maternal side-50's  . Colon cancer Neg Hx    Social History   Socioeconomic History  . Marital status: Single    Spouse name: None  . Number of children: None  . Years of education: None  . Highest education level: None  Social Needs  . Financial resource strain: None  . Food insecurity - worry: None  . Food insecurity - inability: None  . Transportation needs - medical: None  . Transportation needs - non-medical: None  Occupational History  . None  Tobacco Use  . Smoking status: Never Smoker  . Smokeless tobacco: Never Used  Substance and Sexual Activity  . Alcohol use: Yes    Alcohol/week: 0.0 oz  Comment: socially  . Drug use: No  . Sexual activity: None  Other Topics Concern  . None  Social History Narrative  . None    Outpatient Encounter Medications as of 06/15/2017  Medication Sig  . acetaminophen (TYLENOL ARTHRITIS PAIN) 650 MG CR tablet Take 1,300 mg by mouth at bedtime.  . B Complex-C (SUPER B COMPLEX PO) Take 1 tablet by mouth daily.  . cephALEXin (KEFLEX) 250 MG capsule Take 250 mg by mouth daily.  Marland Kitchen. conjugated estrogens (PREMARIN) vaginal cream Place vaginally.  Marland Kitchen. CRANBERRY PO Take 1 capsule by mouth daily.  . Glucosamine-Chondroitin-MSM-D (TRIPLE FLEX/VITAMIN D3) TABS Take 1 tablet by mouth daily.  Marland Kitchen. losartan (COZAAR) 25 MG tablet TAKE 1 TABLET BY MOUTH TWICE DAILY  . Omega-3 Fatty Acids (FISH OIL PO) Take 1 capsule by mouth daily.  Marland Kitchen. OVER THE COUNTER MEDICATION Take 500 mg by mouth daily. Tumeric 500mg   . Probiotic Product (PROBIOTIC PEARLS PO) Take 1 capsule by mouth daily.   No  facility-administered encounter medications on file as of 06/15/2017.     Review of Systems  Constitutional: Negative for appetite change and unexpected weight change.  HENT: Negative for congestion and sinus pressure.   Respiratory: Negative for cough, chest tightness and shortness of breath.   Cardiovascular: Negative for chest pain, palpitations and leg swelling.  Gastrointestinal: Negative for abdominal pain, diarrhea, nausea and vomiting.  Genitourinary: Negative for difficulty urinating and dysuria.  Musculoskeletal: Negative for joint swelling and myalgias.       Right shoulder pain as outlined.  Right hip pain as outlined.    Skin: Negative for color change and rash.  Neurological: Negative for dizziness, light-headedness and headaches.  Psychiatric/Behavioral: Negative for agitation and dysphoric mood.       Objective:    Physical Exam  Constitutional: She appears well-developed and well-nourished. No distress.  HENT:  Nose: Nose normal.  Mouth/Throat: Oropharynx is clear and moist.  Eyes: Conjunctivae are normal. Right eye exhibits no discharge. Left eye exhibits no discharge.  Neck: Neck supple. No thyromegaly present.  Cardiovascular: Normal rate and regular rhythm.  Pulmonary/Chest: Breath sounds normal. No respiratory distress. She has no wheezes.  Abdominal: Soft. Bowel sounds are normal. There is no tenderness.  Musculoskeletal: She exhibits no edema or tenderness.  Increased pain to palpation over the right lateral hip.    Lymphadenopathy:    She has no cervical adenopathy.  Skin: No rash noted. No erythema.  Psychiatric: She has a normal mood and affect. Her behavior is normal.    BP (!) 144/82   Pulse 96   Temp 98.5 F (36.9 C) (Oral)   Ht 5\' 5"  (1.651 m)   Wt 190 lb 9.6 oz (86.5 kg)   LMP 05/21/1998   SpO2 97%   BMI 31.72 kg/m  Wt Readings from Last 3 Encounters:  06/15/17 190 lb 9.6 oz (86.5 kg)  03/24/17 193 lb (87.5 kg)  02/24/17 193 lb (87.5  kg)     Lab Results  Component Value Date   WBC 8.4 10/30/2016   HGB 15.1 (H) 10/30/2016   HCT 44.4 10/30/2016   PLT 225.0 10/30/2016   GLUCOSE 101 (H) 11/10/2016   CHOL 127 05/30/2016   TRIG 47.0 05/30/2016   HDL 61.20 05/30/2016   LDLCALC 56 05/30/2016   ALT 16 11/10/2016   AST 17 11/10/2016   NA 139 12/09/2016   K 4.0 11/10/2016   CL 98 11/10/2016   CREATININE 0.60 11/10/2016   BUN 9  11/10/2016   CO2 29 11/10/2016   TSH 1.05 05/30/2016   INR 0.98 02/22/2015   HGBA1C 5.5 11/10/2016    Mm Digital Screening Bilateral  Result Date: 08/06/2016 CLINICAL DATA:  Screening. EXAM: DIGITAL SCREENING BILATERAL MAMMOGRAM WITH CAD COMPARISON:  Previous exam(s). ACR Breast Density Category a: The breast tissue is almost entirely fatty. FINDINGS: There are no findings suspicious for malignancy. Images were processed with CAD. IMPRESSION: No mammographic evidence of malignancy. A result letter of this screening mammogram will be mailed directly to the patient. RECOMMENDATION: Screening mammogram in one year. (Code:SM-B-01Y) BI-RADS CATEGORY  1: Negative. Electronically Signed   By: Harmon Pier M.D.   On: 08/06/2016 10:18       Assessment & Plan:   Problem List Items Addressed This Visit    Bursitis of hip    Recent flare.  Discussed exercise and stretches.  Desires no further intervention.  Follow.       Chronic back pain    Back is doing better.  Follow.        Health care maintenance    Physical was scheduled for today 06/15/17. She decline.  Is s/p hysterectomy.  Mammogram 08/06/16 - Birads I.  Scheduled for f/u mammogram.  Colonoscopy 04/30/12 - one polyp sigmoid colon and redundant colon.  Was scheduled for f/u colonoscopy and cancelled.  PAP 05/29/16.        Hypertension - Primary    Blood pressure as outlined.  Better on outside checks.  Discussed medication.  Hold on additional medication. She is working on diet and exercise.  Have her spot check her pressure.  Follow  metabolic panel.       Joint pain    Shoulder and hip pain as outlined.  Discussed exercises and stretches.  Desires no further intervention.  Follow.       Obesity (BMI 30-39.9)    Discussed diet and exercise.  Follow.        Recurrent cystitis    Has been seeing urology.  Was on keflex.  Recently stopped.  Doing well.  Follow.  Has f/u planned 07/2017.        Subacromial bursitis of right shoulder joint    Flare recently.  Some better now.  Doing exercise.  Has seen Dr Katrinka Blazing.  Desires no further intervention at this time.  Follow.            Dale [place], MD

## 2017-06-16 ENCOUNTER — Encounter: Payer: Self-pay | Admitting: Internal Medicine

## 2017-06-16 NOTE — Assessment & Plan Note (Signed)
Shoulder and hip pain as outlined.  Discussed exercises and stretches.  Desires no further intervention.  Follow.

## 2017-06-16 NOTE — Assessment & Plan Note (Signed)
Has been seeing urology.  Was on keflex.  Recently stopped.  Doing well.  Follow.  Has f/u planned 07/2017.

## 2017-06-16 NOTE — Assessment & Plan Note (Signed)
Back is doing better.  Follow.

## 2017-06-16 NOTE — Assessment & Plan Note (Signed)
Flare recently.  Some better now.  Doing exercise.  Has seen Dr Katrinka BlazingSmith.  Desires no further intervention at this time.  Follow.

## 2017-06-16 NOTE — Assessment & Plan Note (Signed)
Blood pressure as outlined.  Better on outside checks.  Discussed medication.  Hold on additional medication. She is working on diet and exercise.  Have her spot check her pressure.  Follow metabolic panel.

## 2017-06-16 NOTE — Assessment & Plan Note (Signed)
Recent flare.  Discussed exercise and stretches.  Desires no further intervention.  Follow.

## 2017-06-16 NOTE — Assessment & Plan Note (Signed)
Discussed diet and exercise.  Follow.  

## 2017-06-23 ENCOUNTER — Other Ambulatory Visit: Payer: Self-pay | Admitting: Internal Medicine

## 2017-06-23 ENCOUNTER — Ambulatory Visit: Payer: Self-pay

## 2017-06-23 DIAGNOSIS — Z1231 Encounter for screening mammogram for malignant neoplasm of breast: Secondary | ICD-10-CM

## 2017-07-02 ENCOUNTER — Other Ambulatory Visit: Payer: Self-pay

## 2017-07-02 DIAGNOSIS — Z299 Encounter for prophylactic measures, unspecified: Secondary | ICD-10-CM

## 2017-07-02 NOTE — Progress Notes (Signed)
Patient came in to have blood drawn for testing per Dr. [name] Scott's orders. 

## 2017-07-03 LAB — SEDIMENTATION RATE: SED RATE: 3 mm/h (ref 0–40)

## 2017-07-03 LAB — CMP12+LP+TP+TSH+6AC+CBC/D/PLT
A/G RATIO: 2 (ref 1.2–2.2)
ALK PHOS: 70 IU/L (ref 39–117)
ALT: 17 IU/L (ref 0–32)
AST: 17 IU/L (ref 0–40)
Albumin: 4.4 g/dL (ref 3.6–4.8)
BASOS ABS: 0 10*3/uL (ref 0.0–0.2)
BUN/Creatinine Ratio: 25 (ref 12–28)
BUN: 18 mg/dL (ref 8–27)
Basos: 0 %
Bilirubin Total: 0.3 mg/dL (ref 0.0–1.2)
CHOL/HDL RATIO: 2.5 ratio (ref 0.0–4.4)
CHOLESTEROL TOTAL: 153 mg/dL (ref 100–199)
Calcium: 9.3 mg/dL (ref 8.7–10.3)
Chloride: 105 mmol/L (ref 96–106)
Creatinine, Ser: 0.72 mg/dL (ref 0.57–1.00)
EOS (ABSOLUTE): 0.2 10*3/uL (ref 0.0–0.4)
Eos: 4 %
Estimated CHD Risk: <0.5 times avg. (ref 0.0–1.0)
FREE THYROXINE INDEX: 2.2 (ref 1.2–4.9)
GFR calc non Af Amer: 87 mL/min/{1.73_m2} (ref >59)
GFR, EST AFRICAN AMERICAN: 100 mL/min/{1.73_m2} (ref >59)
GGT: 20 IU/L (ref 0–60)
Globulin, Total: 2.2 g/dL (ref 1.5–4.5)
Glucose: 92 mg/dL (ref 65–99)
HDL: 62 mg/dL (ref >39)
Hematocrit: 41.4 % (ref 34.0–46.6)
Hemoglobin: 13.5 g/dL (ref 11.1–15.9)
IMMATURE GRANS (ABS): 0 10*3/uL (ref 0.0–0.1)
IMMATURE GRANULOCYTES: 0 %
Iron: 58 ug/dL (ref 27–139)
LDH: 177 IU/L (ref 119–226)
LDL Calculated: 78 mg/dL (ref 0–99)
LYMPHS: 27 %
Lymphocytes Absolute: 1.4 10*3/uL (ref 0.7–3.1)
MCH: 29.7 pg (ref 26.6–33.0)
MCHC: 32.6 g/dL (ref 31.5–35.7)
MCV: 91 fL (ref 79–97)
MONOCYTES: 9 %
MONOS ABS: 0.5 10*3/uL (ref 0.1–0.9)
NEUTROS ABS: 3 10*3/uL (ref 1.4–7.0)
NEUTROS PCT: 60 %
PLATELETS: 208 10*3/uL (ref 150–379)
Phosphorus: 5.4 mg/dL — ABNORMAL HIGH (ref 2.5–4.5)
Potassium: 4.2 mmol/L (ref 3.5–5.2)
RBC: 4.54 x10E6/uL (ref 3.77–5.28)
RDW: 13.2 % (ref 12.3–15.4)
Sodium: 143 mmol/L (ref 134–144)
T3 Uptake Ratio: 30 % (ref 24–39)
T4 TOTAL: 7.4 ug/dL (ref 4.5–12.0)
TRIGLYCERIDES: 67 mg/dL (ref 0–149)
TSH: 1.26 u[IU]/mL (ref 0.450–4.500)
Total Protein: 6.6 g/dL (ref 6.0–8.5)
Uric Acid: 4.3 mg/dL (ref 2.5–7.1)
VLDL Cholesterol Cal: 13 mg/dL (ref 5–40)
WBC: 5 10*3/uL (ref 3.4–10.8)

## 2017-07-03 LAB — VITAMIN D 25 HYDROXY (VIT D DEFICIENCY, FRACTURES): Vit D, 25-Hydroxy: 45.9 ng/mL (ref 30.0–100.0)

## 2017-07-03 LAB — RHEUMATOID FACTOR: Rhuematoid fact SerPl-aCnc: <10 IU/mL (ref 0.0–13.9)

## 2017-07-08 NOTE — Progress Notes (Signed)
Here is patient labs results. Sincerely.

## 2017-07-09 ENCOUNTER — Encounter: Payer: Self-pay | Admitting: Internal Medicine

## 2017-07-14 NOTE — Telephone Encounter (Signed)
Mailed to patient

## 2017-08-25 ENCOUNTER — Ambulatory Visit
Admission: RE | Admit: 2017-08-25 | Discharge: 2017-08-25 | Disposition: A | Discharge status: Home / Self Care | Payer: Managed Care, Other (non HMO) | Source: Ambulatory Visit | Attending: Internal Medicine | Admitting: Internal Medicine

## 2017-08-25 DIAGNOSIS — Z1231 Encounter for screening mammogram for malignant neoplasm of breast: Secondary | ICD-10-CM | POA: Diagnosis present

## 2017-09-28 ENCOUNTER — Other Ambulatory Visit: Payer: Self-pay | Admitting: Internal Medicine

## 2017-10-07 ENCOUNTER — Ambulatory Visit (INDEPENDENT_AMBULATORY_CARE_PROVIDER_SITE_OTHER): Payer: Managed Care, Other (non HMO) | Admitting: Internal Medicine

## 2017-10-07 VITALS — BP 148/86 | HR 67 | Temp 98.4°F | Resp 18 | Ht 64.5 in | Wt 188.2 lb

## 2017-10-07 DIAGNOSIS — Z8371 Family history of colonic polyps: Secondary | ICD-10-CM | POA: Diagnosis not present

## 2017-10-07 DIAGNOSIS — M19011 Primary osteoarthritis, right shoulder: Secondary | ICD-10-CM | POA: Diagnosis not present

## 2017-10-07 DIAGNOSIS — M545 Low back pain, unspecified: Secondary | ICD-10-CM

## 2017-10-07 DIAGNOSIS — Z0000 Encounter for general adult medical examination without abnormal findings: Secondary | ICD-10-CM | POA: Diagnosis not present

## 2017-10-07 DIAGNOSIS — I10 Essential (primary) hypertension: Secondary | ICD-10-CM

## 2017-10-07 DIAGNOSIS — G8929 Other chronic pain: Secondary | ICD-10-CM | POA: Diagnosis not present

## 2017-10-07 DIAGNOSIS — M7070 Other bursitis of hip, unspecified hip: Secondary | ICD-10-CM

## 2017-10-07 DIAGNOSIS — Z0289 Encounter for other administrative examinations: Secondary | ICD-10-CM | POA: Diagnosis not present

## 2017-10-07 DIAGNOSIS — E669 Obesity, unspecified: Secondary | ICD-10-CM

## 2017-10-07 NOTE — Progress Notes (Signed)
Patient ID: Dawn Vargas, female   DOB: 08/18/1950, 67 y.o.   MRN: [id number]   Subjective:    Patient ID: Dawn Vargas, female    DOB: [birth year], 67 y.o.   MRN: [id number]  HPI  Patient here for her physical exam.  She reports she is doing relatively well.  Has a history of recurrent uti.  Was on keflex suppression dose.  Off now.  Just saw urology 07/23/17. Note reviewed.  Recommended continuing vaginal estrogen, D-mannose and femdophilus.  Doing well with no urinary symptoms.  Tries to stay active.  No chest pain.  No sob.  No acid reflux.  No abdominal pain.  Bowels moving.  Has been having problems with right shoulder.  Saw chiropractor.  Better.  Back is stable.  Stretches helped her hip.  Needs form completed for work.    Past Medical History:  Diagnosis Date  . Allergic asthma   . Allergic rhinitis   . Arthritis   . Chronic back pain    s/p surgery for herniated disc 93/89)  . Chronic back pain   . Complication of anesthesia   . Frequent UTI   . GERD (gastroesophageal reflux disease)    pt. states no symptoms  . Heart murmur    history of  . History of bronchitis   . History of kidney stones   . History of shingles   . Hypertension   . Hypertension   . PONV (postoperative nausea and vomiting)    pt. states history of nausea in 1989  . Reactive airway disease   . Rotator cuff tear    left shoulder- pt. states healing   Past Surgical History:  Procedure Laterality Date  . BACK SURGERY    . CHOLECYSTECTOMY  August 25 2012  . COLONOSCOPY  2013  . ESOPHAGOGASTRODUODENOSCOPY  01/2011  . EYE SURGERY    . laporoscopy  08/22/1997  . VAGINAL HYSTERECTOMY  1999   laparoscopic assisted   Family History  Problem Relation Age of Onset  . Dementia Father   . Prostate cancer Father   . Heart disease Mother        myocardial infarction, congestive heart failure  . Hypertension Mother   . Heart disease Brother        angioplasty  . Hypertension Brother   . Hypertension  Sister   . Breast cancer Cousin        maternal side-50's  . Colon cancer Neg Hx    Social History   Socioeconomic History  . Marital status: Single    Spouse name: Not on file  . Number of children: Not on file  . Years of education: Not on file  . Highest education level: Not on file  Occupational History  . Not on file  Social Needs  . Financial resource strain: Not on file  . Food insecurity:    Worry: Not on file    Inability: Not on file  . Transportation needs:    Medical: Not on file    Non-medical: Not on file  Tobacco Use  . Smoking status: Never Smoker  . Smokeless tobacco: Never Used  Substance and Sexual Activity  . Alcohol use: Yes    Alcohol/week: 0.0 oz    Comment: socially  . Drug use: No  . Sexual activity: Not on file  Lifestyle  . Physical activity:    Days per week: Not on file    Minutes per session: Not on file  . Stress:  Not on file  Relationships  . Social connections:    Talks on phone: Not on file    Gets together: Not on file    Attends religious service: Not on file    Active member of club or organization: Not on file    Attends meetings of clubs or organizations: Not on file    Relationship status: Not on file  Other Topics Concern  . Not on file  Social History Narrative  . Not on file    Outpatient Encounter Medications as of 10/07/2017  Medication Sig  . acetaminophen (TYLENOL ARTHRITIS PAIN) 650 MG CR tablet Take 1,300 mg by mouth at bedtime.  . B Complex-C (SUPER B COMPLEX PO) Take 1 tablet by mouth daily.  . cephALEXin (KEFLEX) 250 MG capsule Take 250 mg by mouth daily.  Marland Kitchen conjugated estrogens (PREMARIN) vaginal cream Place vaginally.  Marland Kitchen CRANBERRY PO Take 1 capsule by mouth daily.  . Glucosamine-Chondroitin-MSM-D (TRIPLE FLEX/VITAMIN D3) TABS Take 1 tablet by mouth daily.  Marland Kitchen losartan (COZAAR) 25 MG tablet TAKE 1 TABLET BY MOUTH TWICE DAILY  . Omega-3 Fatty Acids (FISH OIL PO) Take 1 capsule by mouth daily.  Marland Kitchen OVER THE  COUNTER MEDICATION Take 500 mg by mouth daily. Tumeric   . Probiotic Product (PROBIOTIC PEARLS PO) Take 1 capsule by mouth daily.   No facility-administered encounter medications on file as of 10/07/2017.     Review of Systems  Constitutional: Negative for appetite change and unexpected weight change.  HENT: Negative for congestion and sinus pressure.   Eyes: Negative for pain and visual disturbance.  Respiratory: Negative for cough, chest tightness and shortness of breath.   Cardiovascular: Negative for chest pain, palpitations and leg swelling.  Gastrointestinal: Negative for abdominal pain, diarrhea, nausea and vomiting.  Genitourinary: Negative for difficulty urinating and dysuria.  Musculoskeletal: Negative for joint swelling.       Back pain is stable.  Shoulder better.    Skin: Negative for color change and rash.  Neurological: Negative for dizziness, light-headedness and headaches.  Hematological: Negative for adenopathy. Does not bruise/bleed easily.  Psychiatric/Behavioral: Negative for agitation and dysphoric mood.       Objective:     Blood pressure rechecked by me:  148/86  Physical Exam  Constitutional: She is oriented to person, place, and time. She appears well-developed and well-nourished. No distress.  HENT:  Nose: Nose normal.  Mouth/Throat: Oropharynx is clear and moist.  Eyes: Right eye exhibits no discharge. Left eye exhibits no discharge. No scleral icterus.  Neck: Neck supple. No thyromegaly present.  Cardiovascular: Normal rate and regular rhythm.  Pulmonary/Chest: Breath sounds normal. No accessory muscle usage. No tachypnea. No respiratory distress. She has no decreased breath sounds. She has no wheezes. She has no rhonchi. Right breast exhibits no inverted nipple, no mass, no nipple discharge and no tenderness (no axillary adenopathy). Left breast exhibits no inverted nipple, no mass, no nipple discharge and no tenderness (no axilarry adenopathy).    Abdominal: Soft. Bowel sounds are normal. There is no tenderness.  Musculoskeletal: She exhibits no edema or tenderness.  Lymphadenopathy:    She has no cervical adenopathy.  Neurological: She is alert and oriented to person, place, and time.  Skin: No rash noted. No erythema.  Psychiatric: She has a normal mood and affect. Her behavior is normal.    BP (!) 148/86   Pulse 67   Temp 98.4 F (36.9 C) (Oral)   Resp 18   Ht 5' 4.5" (  1.638 m)   Wt 188 lb 3.2 oz (85.4 kg)   LMP 05/21/1998   SpO2 98%   BMI 31.81 kg/m  Wt Readings from Last 3 Encounters:  10/07/17 188 lb 3.2 oz (85.4 kg)  06/15/17 190 lb 9.6 oz (86.5 kg)  03/24/17 193 lb (87.5 kg)     Lab Results  Component Value Date   WBC 5.0 07/02/2017   HGB 13.5 07/02/2017   HCT 41.4 07/02/2017   PLT 208 07/02/2017   GLUCOSE 92 07/02/2017   CHOL 153 07/02/2017   TRIG 67 07/02/2017   HDL 62 07/02/2017   LDLCALC 78 07/02/2017   ALT 17 07/02/2017   AST 17 07/02/2017   NA 143 07/02/2017   K 4.2 07/02/2017   CL 105 07/02/2017   CREATININE 0.72 07/02/2017   BUN 18 07/02/2017   CO2 29 11/10/2016   TSH 1.260 07/02/2017   INR 0.98 02/22/2015   HGBA1C 5.5 11/10/2016    Mm Digital Screening Bilateral  Result Date: 08/25/2017 CLINICAL DATA:  Screening. EXAM: DIGITAL SCREENING BILATERAL MAMMOGRAM WITH CAD COMPARISON:  Previous exam(s). ACR Breast Density Category b: There are scattered areas of fibroglandular density. FINDINGS: There are no findings suspicious for malignancy. Images were processed with CAD. IMPRESSION: No mammographic evidence of malignancy. A result letter of this screening mammogram will be mailed directly to the patient. RECOMMENDATION: Screening mammogram in one year. (Code:SM-B-01Y) BI-RADS CATEGORY  1: Negative. Electronically Signed   By: Hulan Saas M.D.   On: 08/25/2017 14:17       Assessment & Plan:   Problem List Items Addressed This Visit    Arthritis of right acromioclavicular joint     Previously saw Dr Katrinka Blazing.  Went to [place].  Is better.  Follow.       Bursitis of hip    Stretches helped hip.        Chronic back pain    Stable.       Family history of colonic polyps    Had colonoscopy 04/2012.  Pt had reported due f/u in 10 years.  Per chart review after pt left, GI recommended f/u in 5 years.  Needs f/u.  Will notify.        Health care maintenance    Physical today 10/07/17.  S/p hysterectomy.  Mammogram 08/25/17 - Birads I.  Colonoscopy 04/2012 - one polyp sigmoid colon and redundant colon.  Cancelled f/u colonoscopy.  PAP 05/29/16.      Hypertension    Reviewed outside blood pressure readings.  Blood pressures averaging 120s/70s.  Continue current medication regimen.  Hold on making changes.  Follow.        Obesity (BMI 30-39.9)    Discussed diet and exercise.  Follow.       Routine general medical examination at a health care facility - Primary    Other Visit Diagnoses    Encounter for completion of form with patient       Work form completed.        Dale [place], MD

## 2017-10-07 NOTE — Assessment & Plan Note (Signed)
Physical today 10/07/17.  S/p hysterectomy.  Mammogram 08/25/17 - Birads I.  Colonoscopy 04/2012 - one polyp sigmoid colon and redundant colon.  Cancelled f/u colonoscopy.  PAP 05/29/16.

## 2017-10-13 ENCOUNTER — Telehealth: Payer: Self-pay | Admitting: Internal Medicine

## 2017-10-13 ENCOUNTER — Encounter: Payer: Self-pay | Admitting: Internal Medicine

## 2017-10-13 DIAGNOSIS — Z8371 Family history of colonic polyps: Secondary | ICD-10-CM | POA: Insufficient documentation

## 2017-10-13 DIAGNOSIS — Z83719 Family history of colon polyps, unspecified: Secondary | ICD-10-CM | POA: Insufficient documentation

## 2017-10-13 NOTE — Assessment & Plan Note (Signed)
Discussed diet and exercise.  Follow.  

## 2017-10-13 NOTE — Assessment & Plan Note (Signed)
Previously saw Dr Katrinka Blazing.  Went to [place].  Is better.  Follow.

## 2017-10-13 NOTE — Telephone Encounter (Signed)
My chart message sent to pt to notify due colonoscopy and needs GI referral.

## 2017-10-13 NOTE — Assessment & Plan Note (Signed)
Stable

## 2017-10-13 NOTE — Assessment & Plan Note (Signed)
Had colonoscopy 04/2012.  Pt had reported due f/u in 10 years.  Per chart review after pt left, GI recommended f/u in 5 years.  Needs f/u.  Will notify.

## 2017-10-13 NOTE — Assessment & Plan Note (Signed)
Stretches helped hip.

## 2017-10-13 NOTE — Assessment & Plan Note (Signed)
Reviewed outside blood pressure readings.  Blood pressures averaging 120s/70s.  Continue current medication regimen.  Hold on making changes.  Follow.

## 2017-12-03 DIAGNOSIS — N390 Urinary tract infection, site not specified: Secondary | ICD-10-CM | POA: Insufficient documentation

## 2018-02-18 ENCOUNTER — Ambulatory Visit (INDEPENDENT_AMBULATORY_CARE_PROVIDER_SITE_OTHER): Payer: Managed Care, Other (non HMO)

## 2018-02-18 ENCOUNTER — Encounter: Payer: Self-pay | Admitting: Internal Medicine

## 2018-02-18 ENCOUNTER — Ambulatory Visit: Payer: Managed Care, Other (non HMO) | Admitting: Internal Medicine

## 2018-02-18 VITALS — BP 124/64 | HR 74 | Temp 97.7°F | Resp 18 | Wt 186.4 lb

## 2018-02-18 DIAGNOSIS — M542 Cervicalgia: Secondary | ICD-10-CM

## 2018-02-18 DIAGNOSIS — I10 Essential (primary) hypertension: Secondary | ICD-10-CM

## 2018-02-18 DIAGNOSIS — G8929 Other chronic pain: Secondary | ICD-10-CM

## 2018-02-18 DIAGNOSIS — Z13220 Encounter for screening for lipoid disorders: Secondary | ICD-10-CM

## 2018-02-18 DIAGNOSIS — M545 Low back pain: Secondary | ICD-10-CM

## 2018-02-18 DIAGNOSIS — E669 Obesity, unspecified: Secondary | ICD-10-CM

## 2018-02-18 DIAGNOSIS — N3090 Cystitis, unspecified without hematuria: Secondary | ICD-10-CM

## 2018-02-18 LAB — CBC WITH DIFFERENTIAL/PLATELET
BASOS ABS: 0 10*3/uL (ref 0.0–0.1)
Basophils Relative: 0.8 % (ref 0.0–3.0)
Eosinophils Absolute: 0.2 10*3/uL (ref 0.0–0.7)
Eosinophils Relative: 4 % (ref 0.0–5.0)
HEMATOCRIT: 43.9 % (ref 36.0–46.0)
Hemoglobin: 14.8 g/dL (ref 12.0–15.0)
LYMPHS ABS: 1.1 10*3/uL (ref 0.7–4.0)
LYMPHS PCT: 22.6 % (ref 12.0–46.0)
MCHC: 33.8 g/dL (ref 30.0–36.0)
MCV: 88.8 fl (ref 78.0–100.0)
MONOS PCT: 8.3 % (ref 3.0–12.0)
Monocytes Absolute: 0.4 10*3/uL (ref 0.1–1.0)
NEUTROS PCT: 64.3 % (ref 43.0–77.0)
Neutro Abs: 3.2 10*3/uL (ref 1.4–7.7)
Platelets: 215 10*3/uL (ref 150.0–400.0)
RBC: 4.94 Mil/uL (ref 3.87–5.11)
RDW: 13.8 % (ref 11.5–15.5)
WBC: 5 10*3/uL (ref 4.0–10.5)

## 2018-02-18 LAB — BASIC METABOLIC PANEL
BUN: 15 mg/dL (ref 6–23)
CO2: 30 meq/L (ref 19–32)
Calcium: 10 mg/dL (ref 8.4–10.5)
Chloride: 102 mEq/L (ref 96–112)
Creatinine, Ser: 0.7 mg/dL (ref 0.40–1.20)
GFR: 88.31 mL/min (ref >60.00)
GLUCOSE: 99 mg/dL (ref 70–99)
POTASSIUM: 4.6 meq/L (ref 3.5–5.1)
SODIUM: 139 meq/L (ref 135–145)

## 2018-02-18 LAB — LIPID PANEL
CHOL/HDL RATIO: 2
Cholesterol: 136 mg/dL (ref 0–200)
HDL: 60 mg/dL (ref >39.00)
LDL CALC: 66 mg/dL (ref 0–99)
NonHDL: 75.95
TRIGLYCERIDES: 48 mg/dL (ref 0.0–149.0)
VLDL: 9.6 mg/dL (ref 0.0–40.0)

## 2018-02-18 LAB — TSH: TSH: 0.77 u[IU]/mL (ref 0.35–4.50)

## 2018-02-18 LAB — HEPATIC FUNCTION PANEL
ALT: 13 U/L (ref 0–35)
AST: 16 U/L (ref 0–37)
Albumin: 4.6 g/dL (ref 3.5–5.2)
Alkaline Phosphatase: 62 U/L (ref 39–117)
BILIRUBIN DIRECT: 0.1 mg/dL (ref 0.0–0.3)
BILIRUBIN TOTAL: 0.5 mg/dL (ref 0.2–1.2)
TOTAL PROTEIN: 7 g/dL (ref 6.0–8.3)

## 2018-02-18 NOTE — Progress Notes (Signed)
Patient ID: Dawn Vargas, female   DOB: 04/21/1951, 67 y.o.   MRN: [id number]   Subjective:    Patient ID: Dawn Vargas, female    DOB: 09/06/1950, 67 y.o.   MRN: [id number]  HPI  Patient here for a scheduled follow up.  She has been under increased stress recently.  Her cousin passed away.  Some other family stress.  Discussed with her today.  Overall she feels she is handling things relatively well.  No chest pain.  Doing home exercise.  No sob.  No acid reflux.  Some right low back pain.  Notices more when bends over.  Radiates around into hip.  Doing PT for the last 5-6 weeks.  Is some better.   Having neck pain.  Persistent.  Bowels stable.  Overall she feels better.  No urinary issues.     Past Medical History:  Diagnosis Date  . Allergic asthma   . Allergic rhinitis   . Arthritis   . Chronic back pain    s/p surgery for herniated disc 93/89)  . Chronic back pain   . Complication of anesthesia   . Frequent UTI   . GERD (gastroesophageal reflux disease)    pt. states no symptoms  . Heart murmur    history of  . History of bronchitis   . History of kidney stones   . History of shingles   . Hypertension   . Hypertension   . PONV (postoperative nausea and vomiting)    pt. states history of nausea in 1989  . Reactive airway disease   . Rotator cuff tear    left shoulder- pt. states healing   Past Surgical History:  Procedure Laterality Date  . BACK SURGERY    . CHOLECYSTECTOMY  August 25 2012  . COLONOSCOPY  2013  . ESOPHAGOGASTRODUODENOSCOPY  01/2011  . EYE SURGERY    . laporoscopy  08/22/1997  . VAGINAL HYSTERECTOMY  1999   laparoscopic assisted   Family History  Problem Relation Age of Onset  . Dementia Father   . Prostate cancer Father   . Heart disease Mother        myocardial infarction, congestive heart failure  . Hypertension Mother   . Heart disease Brother        angioplasty  . Hypertension Brother   . Hypertension Sister   . Breast cancer Cousin        maternal side-50's  . Colon cancer Neg Hx    Social History   Socioeconomic History  . Marital status: Single    Spouse name: Not on file  . Number of children: Not on file  . Years of education: Not on file  . Highest education level: Not on file  Occupational History  . Not on file  Social Needs  . Financial resource strain: Not on file  . Food insecurity:    Worry: Not on file    Inability: Not on file  . Transportation needs:    Medical: Not on file    Non-medical: Not on file  Tobacco Use  . Smoking status: Never Smoker  . Smokeless tobacco: Never Used  Substance and Sexual Activity  . Alcohol use: Yes    Alcohol/week: 0.0 standard drinks    Comment: socially  . Drug use: No  . Sexual activity: Not on file  Lifestyle  . Physical activity:    Days per week: Not on file    Minutes per session: Not on file  .  Stress: Not on file  Relationships  . Social connections:    Talks on phone: Not on file    Gets together: Not on file    Attends religious service: Not on file    Active member of club or organization: Not on file    Attends meetings of clubs or organizations: Not on file    Relationship status: Not on file  Other Topics Concern  . Not on file  Social History Narrative  . Not on file    Outpatient Encounter Medications as of 02/18/2018  Medication Sig  . conjugated estrogens (PREMARIN) vaginal cream Place vaginally.  Marland Kitchen acetaminophen (TYLENOL ARTHRITIS PAIN) 650 MG CR tablet Take 1,300 mg by mouth at bedtime.  . B Complex-C (SUPER B COMPLEX PO) Take 1 tablet by mouth daily.  . cephALEXin (KEFLEX) 250 MG capsule Take 250 mg by mouth daily.  Marland Kitchen CRANBERRY PO Take 1 capsule by mouth daily.  . Glucosamine-Chondroitin-MSM-D (TRIPLE FLEX/VITAMIN D3) TABS Take 1 tablet by mouth daily.  Marland Kitchen losartan (COZAAR) 25 MG tablet TAKE 1 TABLET BY MOUTH TWICE DAILY  . Omega-3 Fatty Acids (FISH OIL PO) Take 1 capsule by mouth daily.  Marland Kitchen OVER THE COUNTER MEDICATION Take  500 mg by mouth daily. Tumeric 500mg   . Probiotic Product (PROBIOTIC PEARLS PO) Take 1 capsule by mouth daily.   No facility-administered encounter medications on file as of 02/18/2018.     Review of Systems  Constitutional: Negative for appetite change and unexpected weight change.  HENT: Negative for congestion and sinus pressure.   Respiratory: Negative for cough, chest tightness and shortness of breath.   Cardiovascular: Negative for chest pain, palpitations and leg swelling.  Gastrointestinal: Negative for abdominal pain, diarrhea, nausea and vomiting.  Genitourinary: Negative for difficulty urinating and dysuria.  Musculoskeletal: Positive for neck pain. Negative for joint swelling and myalgias.  Skin: Negative for color change and rash.  Neurological: Negative for dizziness, light-headedness and headaches.  Psychiatric/Behavioral: Negative for agitation and dysphoric mood.       Objective:    Physical Exam  Constitutional: She appears well-developed and well-nourished. No distress.  HENT:  Nose: Nose normal.  Mouth/Throat: Oropharynx is clear and moist.  Neck: Neck supple. No thyromegaly present.  Cardiovascular: Normal rate and regular rhythm.  Pulmonary/Chest: Breath sounds normal. No respiratory distress. She has no wheezes.  Abdominal: Soft. Bowel sounds are normal. There is no tenderness.  Musculoskeletal: She exhibits no edema or tenderness.  Lymphadenopathy:    She has no cervical adenopathy.  Skin: No rash noted. No erythema.  Psychiatric: She has a normal mood and affect. Her behavior is normal.    BP 124/64 (BP Location: Left Arm, Patient Position: Sitting, Cuff Size: Large)   Pulse 74   Temp 97.7 F (36.5 C) (Oral)   Resp 18   Wt 186 lb 6.4 oz (84.6 kg)   LMP 05/21/1998   SpO2 97%   BMI 31.50 kg/m  Wt Readings from Last 3 Encounters:  02/18/18 186 lb 6.4 oz (84.6 kg)  10/07/17 188 lb 3.2 oz (85.4 kg)  06/15/17 190 lb 9.6 oz (86.5 kg)     Lab  Results  Component Value Date   WBC 5.0 02/18/2018   HGB 14.8 02/18/2018   HCT 43.9 02/18/2018   PLT 215.0 02/18/2018   GLUCOSE 99 02/18/2018   CHOL 136 02/18/2018   TRIG 48.0 02/18/2018   HDL 60.00 02/18/2018   LDLCALC 66 02/18/2018   ALT 13 02/18/2018   AST  16 02/18/2018   NA 139 02/18/2018   K 4.6 02/18/2018   CL 102 02/18/2018   CREATININE 0.70 02/18/2018   BUN 15 02/18/2018   CO2 30 02/18/2018   TSH 0.77 02/18/2018   INR 0.98 02/22/2015   HGBA1C 5.5 11/10/2016    Mm Digital Screening Bilateral  Result Date: 08/25/2017 CLINICAL DATA:  Screening. EXAM: DIGITAL SCREENING BILATERAL MAMMOGRAM WITH CAD COMPARISON:  Previous exam(s). ACR Breast Density Category b: There are scattered areas of fibroglandular density. FINDINGS: There are no findings suspicious for malignancy. Images were processed with CAD. IMPRESSION: No mammographic evidence of malignancy. A result letter of this screening mammogram will be mailed directly to the patient. RECOMMENDATION: Screening mammogram in one year. (Code:SM-B-01Y) BI-RADS CATEGORY  1: Negative. Electronically Signed   By: Hulan Saas M.D.   On: 08/25/2017 14:17       Assessment & Plan:   Problem List Items Addressed This Visit    Chronic back pain    Doing some better.  Continue home exercise.        Hypertension    Blood pressure on my check slightly elevated.  Continue current medication regimen.  She is going to exercise more and adjust diet.  Follow pressures.  Follow metabolic panel.        Relevant Orders   CBC with Differential/Platelet (Completed)   Hepatic function panel (Completed)   TSH (Completed)   Basic metabolic panel (Completed)   Neck pain - Primary    Persistent check pain.  Check xray.        Relevant Orders   DG Cervical Spine 2 or 3 views (Completed)   Obesity (BMI 30-39.9)    Discussed diet and exercise.  Follow.        Recurrent cystitis    Worked up by urology.   Off prophylactive abx.  Doing  well.         Other Visit Diagnoses    Screening cholesterol level       Relevant Orders   Lipid panel (Completed)       Dale [place], MD

## 2018-02-19 ENCOUNTER — Encounter: Payer: Self-pay | Admitting: Internal Medicine

## 2018-02-21 ENCOUNTER — Encounter: Payer: Self-pay | Admitting: Internal Medicine

## 2018-02-21 NOTE — Assessment & Plan Note (Signed)
Blood pressure on my check slightly elevated.  Continue current medication regimen.  She is going to exercise more and adjust diet.  Follow pressures.  Follow metabolic panel.

## 2018-02-21 NOTE — Assessment & Plan Note (Signed)
Discussed diet and exercise.  Follow.  

## 2018-02-21 NOTE — Assessment & Plan Note (Signed)
Persistent check pain.  Check xray.

## 2018-02-21 NOTE — Assessment & Plan Note (Signed)
Worked up by urology.   Off prophylactive abx.  Doing well.

## 2018-02-21 NOTE — Assessment & Plan Note (Signed)
Doing some better.  Continue home exercise.

## 2018-04-08 ENCOUNTER — Other Ambulatory Visit: Payer: Self-pay | Admitting: Internal Medicine

## 2018-04-12 ENCOUNTER — Ambulatory Visit: Payer: Self-pay | Admitting: Emergency Medicine

## 2018-04-12 ENCOUNTER — Encounter: Payer: Self-pay | Admitting: Emergency Medicine

## 2018-04-12 VITALS — BP 152/88 | HR 69 | Temp 98.0°F | Resp 14

## 2018-04-12 DIAGNOSIS — J029 Acute pharyngitis, unspecified: Secondary | ICD-10-CM

## 2018-04-12 DIAGNOSIS — J309 Allergic rhinitis, unspecified: Secondary | ICD-10-CM

## 2018-04-12 LAB — POCT RAPID STREP A (OFFICE): Rapid Strep A Screen: NEGATIVE

## 2018-04-12 MED ORDER — METHYLPREDNISOLONE 4 MG PO TBPK: ORAL_TABLET | ORAL | 0 refills | Status: DC
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
## 2018-04-12 NOTE — Patient Instructions (Addendum)
Today's quick strep test was negative.  We are sending off strep culture which should be back in 2 days.(If strep culture is positive, will prescribe an antibiotic) Based on your history, physical exam and negative quick strep test, there is no definite sign of infection, so antibiotic not prescribed today. You likely have allergic sinusitis, triggered by exposure to smoke from burning wood. Treatment: I am prescribing Medrol Dosepak (similar to prednisone, but less likely to raise your blood pressure).  Prescription sent to your pharmacy.   In reviewing your history of elevated blood pressure, stay away from Sudafed, which could raise your blood pressure. Use over-the-counter plain Mucinex twice a day. Use an over-the-counter antihistamine, such as Zyrtec, once a day, to help with nasal drainage, but do not use if it over dries you.  Be sure to follow-up with your PCP, or Dr. Jenne Campus, if not improving in 5 to 7 days, sooner if worse or new symptoms.   Nasal Allergies Nasal allergies are a reaction to allergens in the air. Allergens are particles in the air that cause your body to have an allergic reaction. Nasal allergies are not passed from person to person (are not contagious). They cannot be cured, but they can be controlled. What are the causes? Seasonal nasal allergies (hay fever) are caused by pollen allergens that come from grasses, trees, and weeds. Year-round nasal allergies (perennial allergic rhinitis) are caused by allergens such as house dust mites, pet dander, and mold spores. What increases the risk? The following factors may make you more likely to develop this condition:  Having certain health conditions. These include: ? Other types of allergies, such as food allergies. ? Asthma. ? Eczema.  Having a close relative who has allergies or asthma.  Exposure to house dust, pollen, dander, or other allergens at home or at work.  Exposure to air pollution or secondhand smoke  when you were a child.  What are the signs or symptoms? Symptoms of this condition include:  Sneezing.  Runny nose or stuffy nose (congestion).  Watery (tearing) eyes.  Itchy eyes, nose, mouth, throat, skin, or other area.  Sore throat.  Headache.  Decreased sense of smell or taste.  Fatigue. This may occur if you have trouble sleeping due to allergies.  Swollen eyelids.  How is this diagnosed? This condition is diagnosed with a medical history and physical exam. Allergy testing may be done to determine exactly what triggers your nasal allergies. How is this treated? There is no cure for nasal allergies. Treatment focuses on controlling your symptoms, and it may include:  Medicines that block allergy symptoms. These may include allergy shots, nasal sprays, and oral antihistamines.  Avoiding the allergen.  Follow these instructions at home:  Avoid the allergen that is causing your symptoms, if possible.  Keep windows closed. If possible, use air conditioning when pollen counts are high.  Do not use fans in your home.  Do not hang clothes outside to dry.  Wear sunglasses to keep pollen out of your eyes.  Wash your hands right away after you touch household pets.  Take over-the-counter and prescription medicines only as told by your health care provider.  Keep all follow-up visits as told by your health care provider. This is important. Contact a health care provider if:  You have a fever.  You develop a cough that does not go away (is persistent).  You start to wheeze.  Your symptoms do not improve with treatment.  You have thick nasal  discharge.  You start to have nosebleeds. Get help right away if:  Your tongue or your lips are swollen.  You have trouble breathing.  You feel light-headed or you feel like you are going to faint.  You have cold sweats. This information is not intended to replace advice given to you by your health care provider.  Make sure you discuss any questions you have with your health care provider.

## 2018-04-12 NOTE — Progress Notes (Signed)
   Subjective:    Patient ID: Dawn Vargas, female    DOB: 11/28/1950, 67 y.o.   MRN: [id number]  HPI    Review of Systems     Objective:   Physical Exam        Assessment & Plan:   

## 2018-04-12 NOTE — Progress Notes (Signed)
   Subjective:    Patient ID: Dawn Vargas, female    DOB: 1950-07-10, 67 y.o.   MRN: [id number]  HPI    Review of Systems     Objective:   Physical Exam        Assessment & Plan:

## 2018-04-13 LAB — STREP A DNA PROBE: Strep Gp A Direct, DNA Probe: NEGATIVE

## 2018-04-13 NOTE — Progress Notes (Signed)
Results from Strep test came back and are ready to review. 04/13/18

## 2018-05-20 ENCOUNTER — Encounter: Payer: Self-pay | Admitting: Internal Medicine

## 2018-05-20 ENCOUNTER — Ambulatory Visit: Payer: Managed Care, Other (non HMO) | Admitting: Internal Medicine

## 2018-05-20 DIAGNOSIS — G8929 Other chronic pain: Secondary | ICD-10-CM

## 2018-05-20 DIAGNOSIS — M545 Low back pain, unspecified: Secondary | ICD-10-CM

## 2018-05-20 DIAGNOSIS — I10 Essential (primary) hypertension: Secondary | ICD-10-CM | POA: Diagnosis not present

## 2018-05-20 DIAGNOSIS — M542 Cervicalgia: Secondary | ICD-10-CM

## 2018-05-20 MED ORDER — TIZANIDINE HCL 2 MG PO CAPS: 2.0000 mg | ORAL_CAPSULE | Freq: Every evening | ORAL | 0 refills | Status: DC | PRN

## 2018-05-20 NOTE — Progress Notes (Signed)
Patient ID: SERAFINA TOPHAM, female   DOB: [birth year], 67 y.o.   MRN: [id number]   Subjective:    Patient ID: [name], female    DOB: 1951-05-06, 67 y.o.   MRN: [id number]  HPI  Patient here for a scheduled follow up.  Has adjusted diet.  Off sugar and has decreased fat intake.  Has been doing - intermittent fasting.  Exercising more.  Lost weight.  Feels better.  Staying active.  No chest pain.  No sob.  No acid reflux.  No abdominal pain.  Bowels moving.  Reports some increased neck pain and stiffness.  No pain radiating down arms.  Some stiffness with turning head.  Some shoulder discomfort.  Request muscle relaxer.  Has taken flexeril.  Request refill.  Blood pressures averaging 120-130s/70-80s.     Past Medical History:  Diagnosis Date  . Allergic asthma   . Allergic rhinitis   . Arthritis   . Chronic back pain    s/p surgery for herniated disc 93/89)  . Chronic back pain   . Complication of anesthesia   . Frequent UTI   . GERD (gastroesophageal reflux disease)    pt. states no symptoms  . Heart murmur    history of  . History of bronchitis   . History of kidney stones   . History of shingles   . Hypertension   . Hypertension   . PONV (postoperative nausea and vomiting)    pt. states history of nausea in 1989  . Reactive airway disease   . Rotator cuff tear    left shoulder- pt. states healing   Past Surgical History:  Procedure Laterality Date  . BACK SURGERY    . CHOLECYSTECTOMY  August 25 2012  . COLONOSCOPY  2013  . ESOPHAGOGASTRODUODENOSCOPY  01/2011  . EYE SURGERY    . laporoscopy  08/22/1997  . VAGINAL HYSTERECTOMY  1999   laparoscopic assisted   Family History  Problem Relation Age of Onset  . Dementia Father   . Prostate cancer Father   . Heart disease Mother        myocardial infarction, congestive heart failure  . Hypertension Mother   . Heart disease Brother        angioplasty  . Hypertension Brother   . Hypertension Sister   . Breast cancer  Cousin        maternal side-50's  . Colon cancer Neg Hx    Social History   Socioeconomic History  . Marital status: Single    Spouse name: Not on file  . Number of children: Not on file  . Years of education: Not on file  . Highest education level: Not on file  Occupational History  . Not on file  Social Needs  . Financial resource strain: Not on file  . Food insecurity:    Worry: Not on file    Inability: Not on file  . Transportation needs:    Medical: Not on file    Non-medical: Not on file  Tobacco Use  . Smoking status: Never Smoker  . Smokeless tobacco: Never Used  Substance and Sexual Activity  . Alcohol use: Yes    Alcohol/week: 0.0 standard drinks    Comment: socially  . Drug use: No  . Sexual activity: Not on file  Lifestyle  . Physical activity:    Days per week: Not on file    Minutes per session: Not on file  . Stress: Not on file  Relationships  .  Social connections:    Talks on phone: Not on file    Gets together: Not on file    Attends religious service: Not on file    Active member of club or organization: Not on file    Attends meetings of clubs or organizations: Not on file    Relationship status: Not on file  Other Topics Concern  . Not on file  Social History Narrative  . Not on file    Outpatient Encounter Medications as of 05/20/2018  Medication Sig  . acetaminophen (TYLENOL ARTHRITIS PAIN) 650 MG CR tablet Take 1,300 mg by mouth at bedtime.  . B Complex-C (SUPER B COMPLEX PO) Take 1 tablet by mouth daily.  Marland Kitchen conjugated estrogens (PREMARIN) vaginal cream Place vaginally.  Marland Kitchen CRANBERRY PO Take 1 capsule by mouth daily.  . Glucosamine-Chondroitin-MSM-D (TRIPLE FLEX/VITAMIN D3) TABS Take 1 tablet by mouth daily.  Marland Kitchen losartan (COZAAR) 25 MG tablet TAKE 1 TABLET BY MOUTH TWICE DAILY  . Omega-3 Fatty Acids (FISH OIL PO) Take 1 capsule by mouth daily.  Marland Kitchen OVER THE COUNTER MEDICATION Take 500 mg by mouth daily. Tumeric 500mg   . Probiotic  Product (PROBIOTIC PEARLS PO) Take 1 capsule by mouth daily.  . [DISCONTINUED] methylPREDNISolone (MEDROL DOSEPAK) 4 MG TBPK tablet Take as directed for 6 days  . [DISCONTINUED] methylPREDNISolone (MEDROL DOSEPAK) 4 MG TBPK tablet Take as directed for 6 days  . [DISCONTINUED] tizanidine (ZANAFLEX) 2 MG capsule Take 1 capsule (2 mg total) by mouth at bedtime as needed for muscle spasms.   No facility-administered encounter medications on file as of 05/20/2018.     Review of Systems  Constitutional: Negative for appetite change and unexpected weight change.  HENT: Negative for congestion and sinus pressure.   Respiratory: Negative for cough, chest tightness and shortness of breath.   Cardiovascular: Negative for chest pain, palpitations and leg swelling.  Gastrointestinal: Negative for abdominal pain, diarrhea, nausea and vomiting.  Genitourinary: Negative for difficulty urinating and dysuria.  Musculoskeletal: Positive for neck pain. Negative for joint swelling and myalgias.       Neck and shoulder pain.    Skin: Negative for color change and rash.  Neurological: Negative for dizziness, light-headedness and headaches.  Psychiatric/Behavioral: Negative for agitation and dysphoric mood.       Objective:    Physical Exam Constitutional:      General: She is not in acute distress.    Appearance: Normal appearance.  HENT:     Nose: Nose normal. No congestion.  Neck:     Musculoskeletal: Neck supple.     Thyroid: No thyromegaly.     Comments: Minimal discomfort with rotation of head from left to right.   Cardiovascular:     Rate and Rhythm: Normal rate and regular rhythm.  Pulmonary:     Effort: No respiratory distress.     Breath sounds: Normal breath sounds. No wheezing.  Abdominal:     General: Bowel sounds are normal.     Palpations: Abdomen is soft.     Tenderness: There is no abdominal tenderness.  Musculoskeletal:        General: No swelling or tenderness.    Lymphadenopathy:     Cervical: No cervical adenopathy.  Skin:    Findings: No erythema or rash.  Neurological:     Mental Status: She is alert.  Psychiatric:        Mood and Affect: Mood normal.        Behavior: Behavior normal.  BP (!) 142/80 (BP Location: Left Arm, Patient Position: Sitting, Cuff Size: Normal)   Pulse 75   Temp 97.7 F (36.5 C) (Oral)   Resp 16   Wt 173 lb 9.6 oz (78.7 kg)   LMP 05/21/1998   SpO2 99%   BMI 29.34 kg/m  Wt Readings from Last 3 Encounters:  05/20/18 173 lb 9.6 oz (78.7 kg)  02/18/18 186 lb 6.4 oz (84.6 kg)  10/07/17 188 lb 3.2 oz (85.4 kg)     Lab Results  Component Value Date   WBC 5.0 02/18/2018   HGB 14.8 02/18/2018   HCT 43.9 02/18/2018   PLT 215.0 02/18/2018   GLUCOSE 99 02/18/2018   CHOL 136 02/18/2018   TRIG 48.0 02/18/2018   HDL 60.00 02/18/2018   LDLCALC 66 02/18/2018   ALT 13 02/18/2018   AST 16 02/18/2018   NA 139 02/18/2018   K 4.6 02/18/2018   CL 102 02/18/2018   CREATININE 0.70 02/18/2018   BUN 15 02/18/2018   CO2 30 02/18/2018   TSH 0.77 02/18/2018   INR 0.98 02/22/2015   HGBA1C 5.5 11/10/2016    Mm Digital Screening Bilateral  Result Date: 08/25/2017 CLINICAL DATA:  Screening. EXAM: DIGITAL SCREENING BILATERAL MAMMOGRAM WITH CAD COMPARISON:  Previous exam(s). ACR Breast Density Category b: There are scattered areas of fibroglandular density. FINDINGS: There are no findings suspicious for malignancy. Images were processed with CAD. IMPRESSION: No mammographic evidence of malignancy. A result letter of this screening mammogram will be mailed directly to the patient. RECOMMENDATION: Screening mammogram in one year. (Code:SM-B-01Y) BI-RADS CATEGORY  1: Negative. Electronically Signed   By: Hulan Saashomas  Lawrence M.D.   On: 08/25/2017 14:17       Assessment & Plan:   Problem List Items Addressed This Visit    Chronic back pain    Back doing better.  Has lost weight.  Exercising.        Hypertension    Blood  pressures on outside checks reviewed - averaging 120-130s/70-80s.  Continue same medication regimen.  Follow pressures.  Follow metabolic panel.        Neck pain    Neck pain as outlined.  Aggravated by rotating head.  Flexeril.  Follow.            Dale Durhamharlene Akash Winski, MD

## 2018-05-25 ENCOUNTER — Telehealth: Payer: Self-pay | Admitting: Internal Medicine

## 2018-05-25 MED ORDER — CYCLOBENZAPRINE HCL 5 MG PO TABS: 5.0000 mg | ORAL_TABLET | Freq: Every evening | ORAL | 0 refills | Status: DC | PRN

## 2018-05-25 NOTE — Telephone Encounter (Signed)
I have sent in rx for flexeril.  Need to cancel rx for zanaflex.

## 2018-05-25 NOTE — Telephone Encounter (Signed)
Left detailed message for patient.

## 2018-05-25 NOTE — Telephone Encounter (Signed)
Patient is requesting flexeril instead of zanaflex. She was seen 05/20/18

## 2018-05-25 NOTE — Telephone Encounter (Signed)
See pt. Request for Flexeril.

## 2018-05-25 NOTE — Telephone Encounter (Signed)
Copied from CRM [phone]. Topic: Quick Communication - See Telephone Encounter >> May 25, 2018  2:39 PM Terisa Starraylor, Brittany L wrote: CRM for notification. See Telephone encounter for: 05/25/18.  Patient states that she was in the office on 12/12 and Dr Lorin PicketScott gave her a script for tizanidine (ZANAFLEX) 2 MG capsule but she had asked her to give her flexeril instead because she has had that before. She went to the pharmacy to see if that was ready for pick up and the pharmacy advised her that tizanidine (ZANAFLEX) 2 MG capsule was but not a script for flexeril. She did not pick up the tizanidine (ZANAFLEX) 2 MG capsule. She would like Dr Lorin PicketScott to please send in the script for flexeril.  [facility] [address] - [place]), [place] - [address] [place] [phone]

## 2018-05-30 ENCOUNTER — Encounter: Payer: Self-pay | Admitting: Internal Medicine

## 2018-05-30 NOTE — Assessment & Plan Note (Signed)
Back doing better.  Has lost weight.  Exercising.

## 2018-05-30 NOTE — Assessment & Plan Note (Signed)
Neck pain as outlined.  Aggravated by rotating head.  Flexeril.  Follow.

## 2018-05-30 NOTE — Assessment & Plan Note (Signed)
Blood pressures on outside checks reviewed - averaging 120-130s/70-80s.  Continue same medication regimen.  Follow pressures.  Follow metabolic panel.

## 2018-10-18 ENCOUNTER — Other Ambulatory Visit: Payer: Self-pay

## 2018-10-21 ENCOUNTER — Encounter: Payer: Self-pay | Admitting: Internal Medicine

## 2018-10-21 ENCOUNTER — Other Ambulatory Visit: Payer: Self-pay

## 2018-10-21 ENCOUNTER — Telehealth: Payer: Self-pay | Admitting: Internal Medicine

## 2018-10-21 ENCOUNTER — Ambulatory Visit (INDEPENDENT_AMBULATORY_CARE_PROVIDER_SITE_OTHER): Payer: Managed Care, Other (non HMO) | Admitting: Internal Medicine

## 2018-10-21 VITALS — BP 156/92 | HR 97 | Temp 97.9°F | Resp 16 | Wt 165.4 lb

## 2018-10-21 DIAGNOSIS — I10 Essential (primary) hypertension: Secondary | ICD-10-CM | POA: Diagnosis not present

## 2018-10-21 DIAGNOSIS — Z1239 Encounter for other screening for malignant neoplasm of breast: Secondary | ICD-10-CM

## 2018-10-21 DIAGNOSIS — R42 Dizziness and giddiness: Secondary | ICD-10-CM | POA: Diagnosis not present

## 2018-10-21 DIAGNOSIS — Z0000 Encounter for general adult medical examination without abnormal findings: Secondary | ICD-10-CM

## 2018-10-21 DIAGNOSIS — M25511 Pain in right shoulder: Secondary | ICD-10-CM

## 2018-10-21 LAB — LIPID PANEL
Cholesterol: 160 mg/dL (ref 0–200)
HDL: 63.3 mg/dL (ref >39.00)
LDL Cholesterol: 88 mg/dL (ref 0–99)
NonHDL: 97.06
Total CHOL/HDL Ratio: 3
Triglycerides: 47 mg/dL (ref 0.0–149.0)
VLDL: 9.4 mg/dL (ref 0.0–40.0)

## 2018-10-21 LAB — BASIC METABOLIC PANEL
BUN: 15 mg/dL (ref 6–23)
CO2: 33 mEq/L — ABNORMAL HIGH (ref 19–32)
Calcium: 9.7 mg/dL (ref 8.4–10.5)
Chloride: 101 mEq/L (ref 96–112)
Creatinine, Ser: 0.69 mg/dL (ref 0.40–1.20)
GFR: 84.31 mL/min (ref >60.00)
Glucose, Bld: 88 mg/dL (ref 70–99)
Potassium: 4.9 mEq/L (ref 3.5–5.1)
Sodium: 140 mEq/L (ref 135–145)

## 2018-10-21 LAB — HEPATIC FUNCTION PANEL
ALT: 18 U/L (ref 0–35)
AST: 19 U/L (ref 0–37)
Albumin: 4.5 g/dL (ref 3.5–5.2)
Alkaline Phosphatase: 71 U/L (ref 39–117)
Bilirubin, Direct: 0.2 mg/dL (ref 0.0–0.3)
Total Bilirubin: 0.6 mg/dL (ref 0.2–1.2)
Total Protein: 6.8 g/dL (ref 6.0–8.3)

## 2018-10-21 NOTE — Assessment & Plan Note (Signed)
Resolved with changing dose of losartan.  Follow pressures.

## 2018-10-21 NOTE — Assessment & Plan Note (Addendum)
Physical today 10/21/18.  Mammogram 08/25/17 - Birads I. Overdue.   She will call to schedule.  Order placed.  Discussed overdue colonoscopy.  She will let me know when agreeable.

## 2018-10-21 NOTE — Telephone Encounter (Signed)
Copied from CRM [id number]. Topic: General - Other >> Oct 21, 2018  4:45 PM Doreatha Massed wrote: Reason for CRM: pt is calling in for her Bp reading  134/74 pulse 64  Please advise

## 2018-10-21 NOTE — Assessment & Plan Note (Signed)
Blood pressure elevated here in the office.  Recheck improved, but still elevated.  Blood pressure checks on outside readings 120-130/70s.  Continue current medication regimen.  Follow pressures.  Follow metabolic panel.

## 2018-10-21 NOTE — Assessment & Plan Note (Signed)
Persistent.  Refer to ortho for evaluation.

## 2018-10-21 NOTE — Progress Notes (Signed)
Patient ID: Hector Shadedwina J Newbern, female   DOB: 07/18/1950, 68 y.o.   MRN: [id number]   Subjective:    Patient ID: Hector ShadeEdwina J Dilks, female    DOB: 06/25/1950, 68 y.o.   MRN: [id number]  HPI  Patient here for her physical exam.  She reports she is doing relatively well.  Her daughter is a Runner, broadcasting/film/videoteacher at [employer].  Has been home with COVID restrictions.  Living with her.  They are staying in.  No fever.  No cough, chest congestion or sob.  No acid reflux.  No abdominal pain.  Bowels moving.  No urine change.  Trying to stay active.  She is exercising.  Has adjusted her diet.  Continuing to lose weight.  Has been having persistent pain in her posterior right shoulder - shoulder into her shoulder blade.  Has been using ice and heat.  Taking some advil.  Not regularly.  Had previous xray neck/shoulder.  Discussed ortho referral given persistent issues.  She is agreeable.  She is currently only taking losartan 25mg  q day.  States started feeling light headed when took the second.  Light headedness subsided when she decreased to 25mg  q day. Outside blood pressure checks look good.  Reviewed - averaging 120-130s/70-80s.  Elevated here today.  States she gets anxious coming in.  She is also wearing her mask and states she is not used to this.  Feels that this is affecting her blood pressure.  No headache.  Overall feels good.  Feels better.  Handling stress.     Past Medical History:  Diagnosis Date  . Allergic asthma   . Allergic rhinitis   . Arthritis   . Chronic back pain    s/p surgery for herniated disc 93/89)  . Chronic back pain   . Complication of anesthesia   . Frequent UTI   . GERD (gastroesophageal reflux disease)    pt. states no symptoms  . Heart murmur    history of  . History of bronchitis   . History of kidney stones   . History of shingles   . Hypertension   . Hypertension   . PONV (postoperative nausea and vomiting)    pt. states history of nausea in 1989  . Reactive airway disease   . Rotator  cuff tear    left shoulder- pt. states healing   Past Surgical History:  Procedure Laterality Date  . BACK SURGERY    . CHOLECYSTECTOMY  August 25 2012  . COLONOSCOPY  2013  . ESOPHAGOGASTRODUODENOSCOPY  01/2011  . EYE SURGERY    . laporoscopy  08/22/1997  . VAGINAL HYSTERECTOMY  1999   laparoscopic assisted   Family History  Problem Relation Age of Onset  . Dementia Father   . Prostate cancer Father   . Heart disease Mother        myocardial infarction, congestive heart failure  . Hypertension Mother   . Heart disease Brother        angioplasty  . Hypertension Brother   . Hypertension Sister   . Breast cancer Cousin        maternal side-50's  . Colon cancer Neg Hx    Social History   Socioeconomic History  . Marital status: Single    Spouse name: Not on file  . Number of children: Not on file  . Years of education: Not on file  . Highest education level: Not on file  Occupational History  . Not on file  Social Needs  .  Financial resource strain: Not on file  . Food insecurity:    Worry: Not on file    Inability: Not on file  . Transportation needs:    Medical: Not on file    Non-medical: Not on file  Tobacco Use  . Smoking status: Never Smoker  . Smokeless tobacco: Never Used  Substance and Sexual Activity  . Alcohol use: Yes    Alcohol/week: 0.0 standard drinks    Comment: socially  . Drug use: No  . Sexual activity: Not on file  Lifestyle  . Physical activity:    Days per week: Not on file    Minutes per session: Not on file  . Stress: Not on file  Relationships  . Social connections:    Talks on phone: Not on file    Gets together: Not on file    Attends religious service: Not on file    Active member of club or organization: Not on file    Attends meetings of clubs or organizations: Not on file    Relationship status: Not on file  Other Topics Concern  . Not on file  Social History Narrative  . Not on file    Outpatient Encounter  Medications as of 10/21/2018  Medication Sig  . acetaminophen (TYLENOL ARTHRITIS PAIN) 650 MG CR tablet Take 1,300 mg by mouth at bedtime.  . B Complex-C (SUPER B COMPLEX PO) Take 1 tablet by mouth daily.  Marland Kitchen conjugated estrogens (PREMARIN) vaginal cream Place vaginally.  Marland Kitchen CRANBERRY PO Take 1 capsule by mouth daily.  . cyclobenzaprine (FLEXERIL) 5 MG tablet Take 1 tablet (5 mg total) by mouth at bedtime as needed for muscle spasms.  . Glucosamine-Chondroitin-MSM-D (TRIPLE FLEX/VITAMIN D3) TABS Take 1 tablet by mouth daily.  Marland Kitchen losartan (COZAAR) 25 MG tablet TAKE 1 TABLET BY MOUTH TWICE DAILY  . Omega-3 Fatty Acids (FISH OIL PO) Take 1 capsule by mouth daily.  Marland Kitchen OVER THE COUNTER MEDICATION Take 500 mg by mouth daily. Tumeric   . Probiotic Product (PROBIOTIC PEARLS PO) Take 1 capsule by mouth daily.   No facility-administered encounter medications on file as of 10/21/2018.     Review of Systems  Constitutional: Negative for appetite change and unexpected weight change.  HENT: Negative for congestion and sinus pressure.   Eyes: Negative for pain and visual disturbance.  Respiratory: Negative for cough, chest tightness and shortness of breath.   Cardiovascular: Negative for chest pain, palpitations and leg swelling.  Gastrointestinal: Negative for abdominal pain and diarrhea.  Genitourinary: Negative for difficulty urinating and dysuria.  Musculoskeletal: Negative for joint swelling and myalgias.       Reports persistent right shoulder - posterior shoulder - pain.    Skin: Negative for color change and rash.  Neurological: Negative for dizziness and headaches.  Hematological: Negative for adenopathy. Does not bruise/bleed easily.  Psychiatric/Behavioral: Negative for agitation and dysphoric mood.       Objective:    Physical Exam Constitutional:      General: She is not in acute distress.    Appearance: Normal appearance. She is well-developed.  HENT:     Right Ear: Ear canal  and external ear normal. There is no impacted cerumen.     Left Ear: Ear canal and external ear normal. There is no impacted cerumen.  Eyes:     General: No scleral icterus.       Right eye: No discharge.        Left eye: No discharge.  Neck:  Musculoskeletal: Neck supple. No muscular tenderness.     Thyroid: No thyromegaly.  Cardiovascular:     Rate and Rhythm: Normal rate and regular rhythm.  Pulmonary:     Effort: No tachypnea, accessory muscle usage or respiratory distress.     Breath sounds: Normal breath sounds. No decreased breath sounds or wheezing.  Chest:     Breasts:        Right: No inverted nipple, mass, nipple discharge or tenderness (no axillary adenopathy).        Left: No inverted nipple, mass, nipple discharge or tenderness (no axilarry adenopathy).  Abdominal:     General: Bowel sounds are normal.     Palpations: Abdomen is soft.     Tenderness: There is no abdominal tenderness.  Musculoskeletal:        General: No swelling or tenderness.     Comments: Appears to have good rom in her right shoulder.    Lymphadenopathy:     Cervical: No cervical adenopathy.  Skin:    Findings: No erythema or rash.  Neurological:     Mental Status: She is alert and oriented to person, place, and time.  Psychiatric:        Mood and Affect: Mood normal.        Behavior: Behavior normal.     BP (!) 156/92   Pulse 97   Temp 97.9 F (36.6 C) (Oral)   Resp 16   Wt 165 lb 6.4 oz (75 kg)   LMP 05/21/1998   SpO2 98%   BMI 27.95 kg/m  Wt Readings from Last 3 Encounters:  10/21/18 165 lb 6.4 oz (75 kg)  05/20/18 173 lb 9.6 oz (78.7 kg)  02/18/18 186 lb 6.4 oz (84.6 kg)     Lab Results  Component Value Date   WBC 5.0 02/18/2018   HGB 14.8 02/18/2018   HCT 43.9 02/18/2018   PLT 215.0 02/18/2018   GLUCOSE 88 10/21/2018   CHOL 160 10/21/2018   TRIG 47.0 10/21/2018   HDL 63.30 10/21/2018   LDLCALC 88 10/21/2018   ALT 18 10/21/2018   AST 19 10/21/2018   NA 140  10/21/2018   K 4.9 10/21/2018   CL 101 10/21/2018   CREATININE 0.69 10/21/2018   BUN 15 10/21/2018   CO2 33 (H) 10/21/2018   TSH 0.77 02/18/2018   INR 0.98 02/22/2015   HGBA1C 5.5 11/10/2016    Mm Digital Screening Bilateral  Result Date: 08/25/2017 CLINICAL DATA:  Screening. EXAM: DIGITAL SCREENING BILATERAL MAMMOGRAM WITH CAD COMPARISON:  Previous exam(s). ACR Breast Density Category b: There are scattered areas of fibroglandular density. FINDINGS: There are no findings suspicious for malignancy. Images were processed with CAD. IMPRESSION: No mammographic evidence of malignancy. A result letter of this screening mammogram will be mailed directly to the patient. RECOMMENDATION: Screening mammogram in one year. (Code:SM-B-01Y) BI-RADS CATEGORY  1: Negative. Electronically Signed   By: Hulan Saas M.D.   On: 08/25/2017 14:17       Assessment & Plan:   Problem List Items Addressed This Visit    Health care maintenance    Physical today 10/21/18.  Mammogram 08/25/17 - Birads I. Overdue.   She will call to schedule.  Order placed.  Discussed overdue colonoscopy.  She will let me know when agreeable.        Hypertension    Blood pressure elevated here in the office.  Recheck improved, but still elevated.  Blood pressure checks on outside readings 120-130/70s.  Continue  current medication regimen.  Follow pressures.  Follow metabolic panel.        Relevant Orders   Hepatic function panel (Completed)   Lipid panel (Completed)   Basic metabolic panel (Completed)   Lightheadedness    Resolved with changing dose of losartan.  Follow pressures.        Right shoulder pain    Persistent.  Refer to ortho for evaluation.       Relevant Orders   Ambulatory referral to Orthopedic Surgery   Routine general medical examination at a health care facility - Primary    Other Visit Diagnoses    Breast cancer screening       Relevant Orders   MM 3D SCREEN BREAST BILATERAL       Dale Lastrup, MD

## 2018-10-22 ENCOUNTER — Encounter: Payer: Self-pay | Admitting: Internal Medicine

## 2018-10-22 ENCOUNTER — Telehealth: Payer: Self-pay

## 2018-10-22 NOTE — Telephone Encounter (Signed)
It was elevated when she was in the office.  I had asked her to recheck and let me know how the bp was doing.  Looks good.  Continue current medication.  Let us know if any problems.  Thank her for following up with [place].

## 2018-10-22 NOTE — Telephone Encounter (Signed)
Noted  

## 2018-10-22 NOTE — Telephone Encounter (Signed)
Copied from CRM [id number]. Topic: General - Other >> Oct 22, 2018 11:39 AM Tamela Oddi wrote: Reason for CRM: Patient is returning a call to [place].  She stated that there was not message but to call her back.  Tried to reach the office but they were not available.  Please advise and call patient back at [phone]

## 2018-10-22 NOTE — Telephone Encounter (Signed)
Copied from CRM #[id number]. Topic: General - Other °>> Oct 21, 2018  4:45 PM Moore, Debra W wrote: °Reason for CRM: pt is calling in for her Bp reading ° °134/74 pulse 64 ° °Please advise °

## 2018-10-22 NOTE — Telephone Encounter (Signed)
See phone note for further documentation 

## 2018-10-22 NOTE — Telephone Encounter (Signed)
LMTCB

## 2018-10-22 NOTE — Telephone Encounter (Signed)
Patient aware. Updated bp today 120/80.

## 2018-10-22 NOTE — Telephone Encounter (Signed)
Patient is wanting to know if she can get a disc of her x-rays from 08/12/16 and 02/18/18. C-spine and Right shoulder. Advised I could let her know when they are ready.

## 2018-10-22 NOTE — Telephone Encounter (Signed)
CD burned & pt has been notified that it has been placed up front for pick up.

## 2018-10-28 ENCOUNTER — Encounter: Payer: Self-pay | Admitting: Internal Medicine

## 2019-03-02 ENCOUNTER — Ambulatory Visit: Payer: Managed Care, Other (non HMO) | Admitting: Internal Medicine

## 2019-03-13 ENCOUNTER — Other Ambulatory Visit: Payer: Self-pay | Admitting: Internal Medicine

## 2019-04-29 IMAGING — DX DG CERVICAL SPINE 2 OR 3 VIEWS
6 series · 6 of 6 positions shown · non-contrast
Comparison: None.

CLINICAL DATA: Persistent neck and shoulder pain for 2-3 months.

EXAM:
CERVICAL SPINE - 2-3 VIEW

[cervical spine ap]
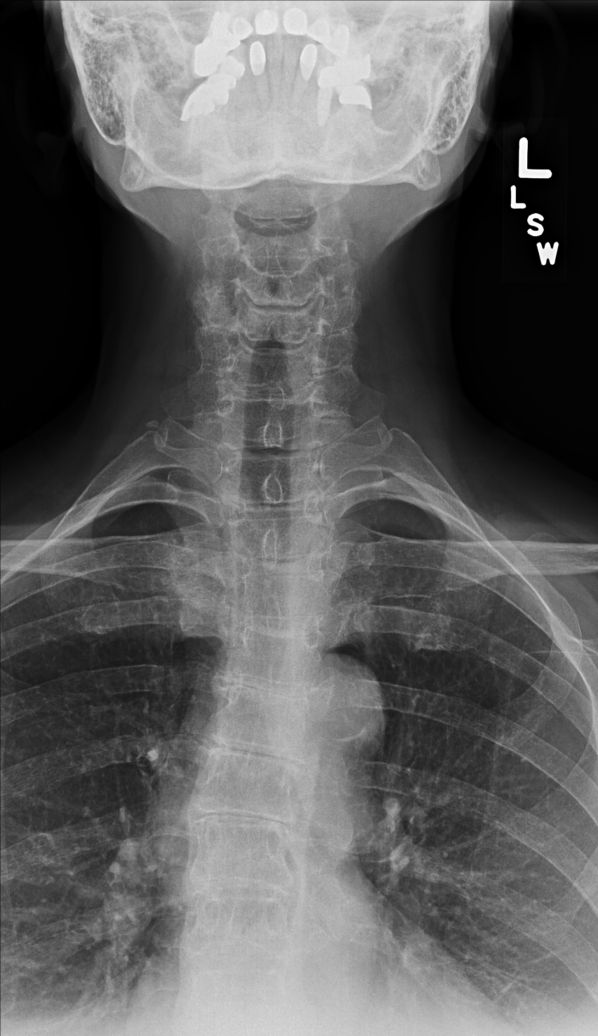

[cervical spine oblique (1 of 2)]
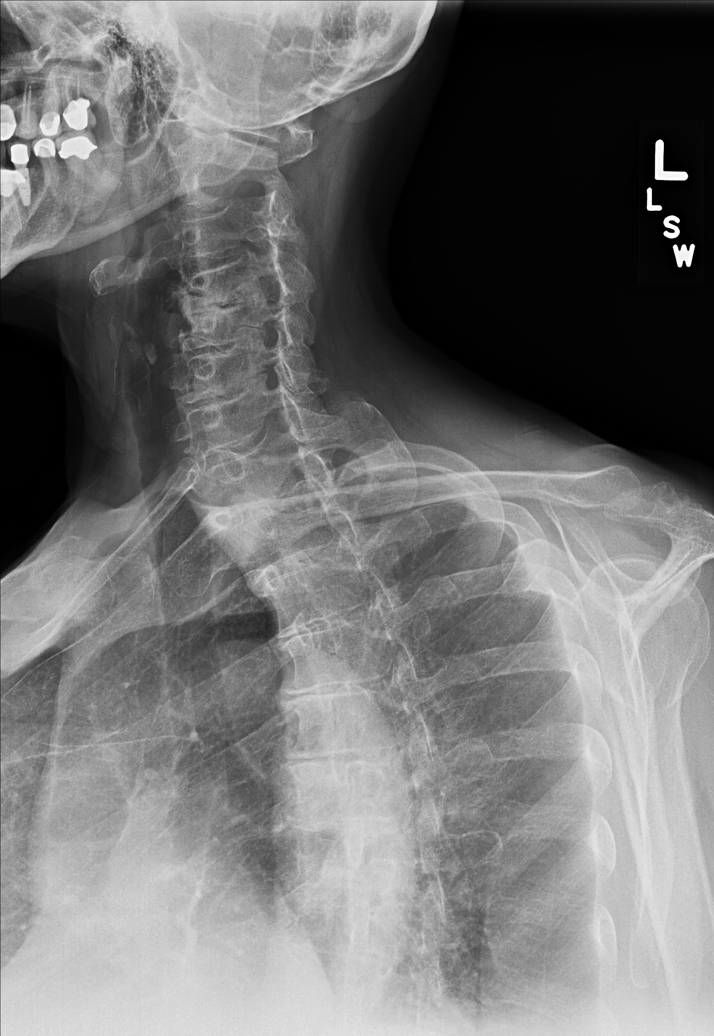

[cervical spine oblique (2 of 2)]
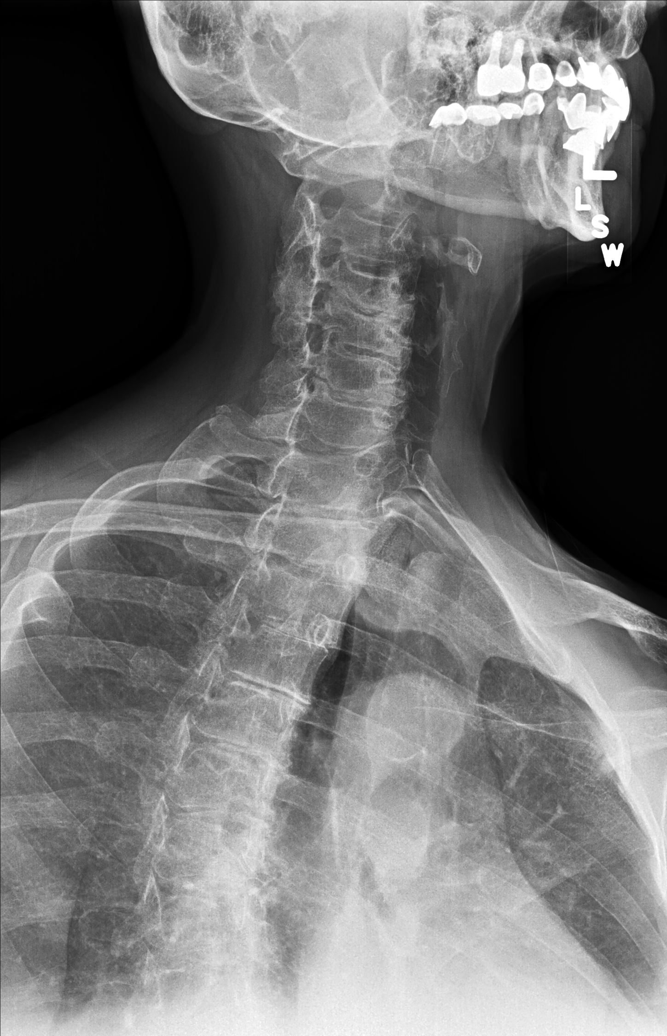

[cervical spine lat]
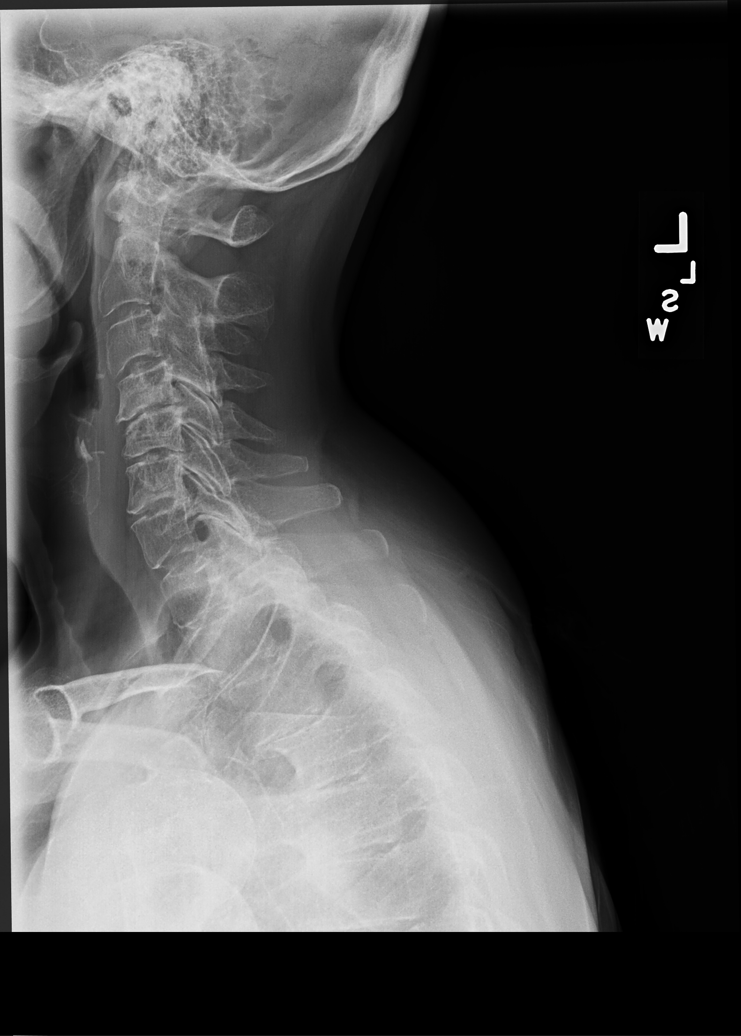

[swimmers lat]
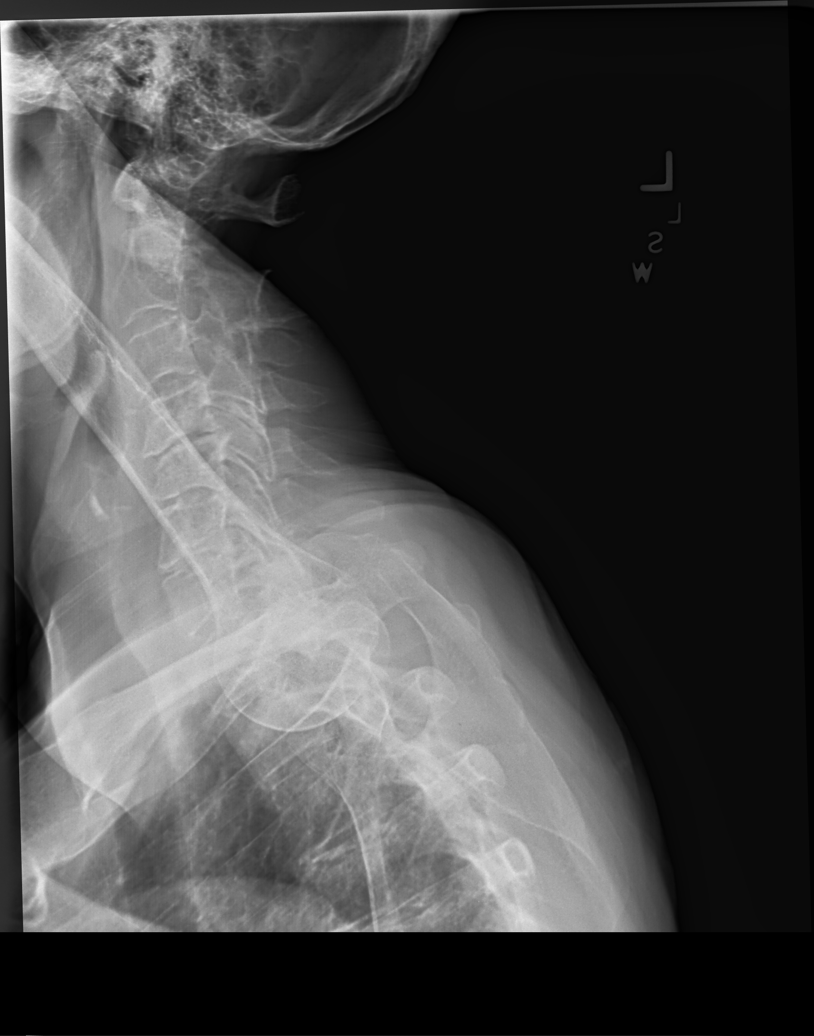

[cervical spine open mouth ap]
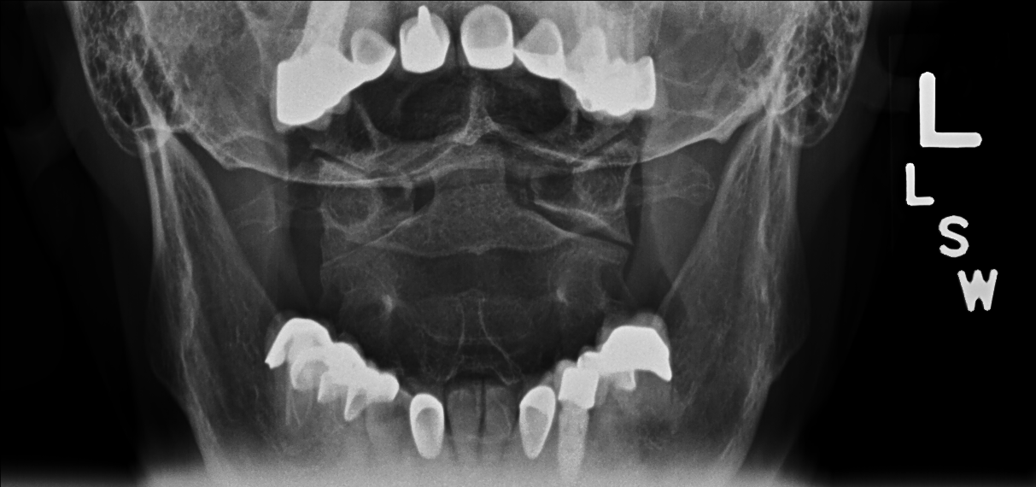

[6 of 6 positions shown; findings below may reference images not displayed]

FINDINGS: No fracture.  No bone lesion.

Mild reversal the normal cervical lordosis, apex at C5. No
spondylolisthesis.

Moderate loss of disc height at C4-C5 and C5-C6. Mild loss of disc
height at C6-C7.

Mild neural foraminal narrowing on the right at C 6-C7 from
uncovertebral spurring. There are other levels of uncovertebral
spurring without significant stenosis.

Soft tissues are unremarkable.
IMPRESSION: 1. No fracture, spondylolisthesis or acute finding.
2. Mild reversal the normal cervical lordosis. Disc degenerative
changes of the mid to lower cervical spine most prominent at C4-C5.

## 2019-10-10 ENCOUNTER — Other Ambulatory Visit: Payer: Self-pay | Admitting: Internal Medicine

## 2019-10-27 ENCOUNTER — Other Ambulatory Visit: Payer: Self-pay

## 2019-10-28 ENCOUNTER — Ambulatory Visit (INDEPENDENT_AMBULATORY_CARE_PROVIDER_SITE_OTHER): Payer: Managed Care, Other (non HMO) | Admitting: Internal Medicine

## 2019-10-28 ENCOUNTER — Other Ambulatory Visit: Payer: Self-pay

## 2019-10-28 VITALS — BP 134/78 | HR 90 | Temp 96.5°F | Resp 16 | Ht 65.0 in | Wt 166.0 lb

## 2019-10-28 DIAGNOSIS — Z1231 Encounter for screening mammogram for malignant neoplasm of breast: Secondary | ICD-10-CM | POA: Diagnosis not present

## 2019-10-28 DIAGNOSIS — I10 Essential (primary) hypertension: Secondary | ICD-10-CM

## 2019-10-28 DIAGNOSIS — M542 Cervicalgia: Secondary | ICD-10-CM | POA: Diagnosis not present

## 2019-10-28 DIAGNOSIS — Z0000 Encounter for general adult medical examination without abnormal findings: Secondary | ICD-10-CM | POA: Diagnosis not present

## 2019-10-28 MED ORDER — CYCLOBENZAPRINE HCL 5 MG PO TABS: 5.0000 mg | ORAL_TABLET | Freq: Every evening | ORAL | 0 refills | Status: DC | PRN

## 2019-10-28 NOTE — Assessment & Plan Note (Addendum)
Physical today 10/28/19.  Mammogram overdue.  Schedule.  Overdue colonoscopy.  She wants to hold - given covid.  Will notify me when agreeable.

## 2019-10-28 NOTE — Progress Notes (Signed)
Patient ID: OLUWATOBI RUPPE, female   DOB: 08-Feb-1951, 69 y.o.   MRN: [id number]   Subjective:    Patient ID: [name], female    DOB: 1950/06/21, 69 y.o.   MRN: [id number]  HPI This visit occurred during the SARS-CoV-2 public health emergency.  Safety protocols were in place, including screening questions prior to the visit, additional usage of staff PPE, and extensive cleaning of exam room while observing appropriate contact time as indicated for disinfecting solutions.  Patient here for her physical exam.  She reports she is doing relatively well.  She is walking - 4x around mall.  Has adjusted her diet.  Keeping weight off.  No chest pain or sob reported with increased activity or exertion.  Neck pain - has seen Dr Joice Lofts.  Discussed PT and referral to Dr Yves Dill.  She saw chiropractor.  Pain better after chiropractor.  Still some neck discomfort.  Pain is better when on vacation and when off.  Using biofreeze.  Takes tylenol.  Contemplating acupuncture.  Discussed muscle relaxer.  She is agreeable.  No abdominal pain or bowel change reported.  Outside blood pressures reviewed - 120-134/70-81.   Past Medical History:  Diagnosis Date  . Allergic asthma   . Allergic rhinitis   . Arthritis   . Chronic back pain    s/p surgery for herniated disc 93/89)  . Chronic back pain   . Complication of anesthesia   . Frequent UTI   . GERD (gastroesophageal reflux disease)    pt. states no symptoms  . Heart murmur    history of  . History of bronchitis   . History of kidney stones   . History of shingles   . Hypertension   . Hypertension   . PONV (postoperative nausea and vomiting)    pt. states history of nausea in 1989  . Reactive airway disease   . Rotator cuff tear    left shoulder- pt. states healing   Past Surgical History:  Procedure Laterality Date  . BACK SURGERY    . CHOLECYSTECTOMY  August 25 2012  . COLONOSCOPY  2013  . ESOPHAGOGASTRODUODENOSCOPY  01/2011  . EYE SURGERY     . laporoscopy  08/22/1997  . VAGINAL HYSTERECTOMY  1999   laparoscopic assisted   Family History  Problem Relation Age of Onset  . Dementia Father   . Prostate cancer Father   . Heart disease Mother        myocardial infarction, congestive heart failure  . Hypertension Mother   . Heart disease Brother        angioplasty  . Hypertension Brother   . Hypertension Sister   . Breast cancer Cousin        maternal side-50's  . Colon cancer Neg Hx    Social History   Socioeconomic History  . Marital status: Single    Spouse name: Not on file  . Number of children: Not on file  . Years of education: Not on file  . Highest education level: Not on file  Occupational History  . Not on file  Tobacco Use  . Smoking status: Never Smoker  . Smokeless tobacco: Never Used  Substance and Sexual Activity  . Alcohol use: Yes    Alcohol/week: 0.0 standard drinks    Comment: socially  . Drug use: No  . Sexual activity: Not on file  Other Topics Concern  . Not on file  Social History Narrative  . Not on file  Social Determinants of Health   Financial Resource Strain:   . Difficulty of Paying Living Expenses:   Food Insecurity:   . Worried About Programme researcher, broadcasting/film/video in the Last Year:   . Barista in the Last Year:   Transportation Needs:   . Freight forwarder (Medical):   Marland Kitchen Lack of Transportation (Non-Medical):   Physical Activity:   . Days of Exercise per Week:   . Minutes of Exercise per Session:   Stress:   . Feeling of Stress :   Social Connections:   . Frequency of Communication with Friends and Family:   . Frequency of Social Gatherings with Friends and Family:   . Attends Religious Services:   . Active Member of Clubs or Organizations:   . Attends Banker Meetings:   Marland Kitchen Marital Status:     Outpatient Encounter Medications as of 10/28/2019  Medication Sig  . acetaminophen (TYLENOL ARTHRITIS PAIN) 650 MG CR tablet Take 1,300 mg by mouth at  bedtime.  . B Complex-C (SUPER B COMPLEX PO) Take 1 tablet by mouth daily.  Marland Kitchen CRANBERRY PO Take 1 capsule by mouth daily.  . cyclobenzaprine (FLEXERIL) 5 MG tablet Take 1 tablet (5 mg total) by mouth at bedtime as needed for muscle spasms.  . Glucosamine-Chondroitin-MSM-D (TRIPLE FLEX/VITAMIN D3) TABS Take 1 tablet by mouth daily.  Marland Kitchen losartan (COZAAR) 25 MG tablet Take 1 tablet by mouth twice daily  . Omega-3 Fatty Acids (FISH OIL PO) Take 1 capsule by mouth daily.  Marland Kitchen OVER THE COUNTER MEDICATION Take 500 mg by mouth daily. Tumeric 500mg   . Probiotic Product (PROBIOTIC PEARLS PO) Take 1 capsule by mouth daily.  . [DISCONTINUED] cyclobenzaprine (FLEXERIL) 5 MG tablet Take 1 tablet (5 mg total) by mouth at bedtime as needed for muscle spasms.   No facility-administered encounter medications on file as of 10/28/2019.    Review of Systems  Constitutional: Negative for appetite change and unexpected weight change.  HENT: Negative for congestion and sinus pressure.   Eyes: Negative for pain and visual disturbance.  Respiratory: Negative for cough, chest tightness and shortness of breath.   Cardiovascular: Negative for chest pain, palpitations and leg swelling.  Gastrointestinal: Negative for abdominal pain, diarrhea, nausea and vomiting.  Genitourinary: Negative for difficulty urinating and dysuria.  Musculoskeletal: Positive for neck pain. Negative for joint swelling and myalgias.  Skin: Negative for color change and rash.  Neurological: Negative for dizziness, light-headedness and headaches.  Hematological: Negative for adenopathy. Does not bruise/bleed easily.  Psychiatric/Behavioral: Negative for agitation and dysphoric mood.       Objective:    Physical Exam Vitals reviewed.  Constitutional:      General: She is not in acute distress.    Appearance: Normal appearance. She is well-developed.  HENT:     Head: Normocephalic and atraumatic.     Right Ear: External ear normal.      Left Ear: External ear normal.  Eyes:     General: No scleral icterus.       Right eye: No discharge.        Left eye: No discharge.     Conjunctiva/sclera: Conjunctivae normal.  Neck:     Thyroid: No thyromegaly.  Cardiovascular:     Rate and Rhythm: Normal rate and regular rhythm.  Pulmonary:     Effort: No tachypnea, accessory muscle usage or respiratory distress.     Breath sounds: Normal breath sounds. No decreased breath sounds or wheezing.  Chest:     Breasts:        Right: No inverted nipple, mass, nipple discharge or tenderness (no axillary adenopathy).        Left: No inverted nipple, mass, nipple discharge or tenderness (no axilarry adenopathy).  Abdominal:     General: Bowel sounds are normal.     Palpations: Abdomen is soft.     Tenderness: There is no abdominal tenderness.  Musculoskeletal:        General: No swelling or tenderness.     Cervical back: Neck supple. No tenderness.  Lymphadenopathy:     Cervical: No cervical adenopathy.  Skin:    Findings: No erythema or rash.  Neurological:     Mental Status: She is alert and oriented to person, place, and time.  Psychiatric:        Mood and Affect: Mood normal.        Behavior: Behavior normal.     BP 134/78   Pulse 90   Temp (!) 96.5 F (35.8 C)   Resp 16   Ht 5\' 5"  (1.651 m)   Wt 166 lb (75.3 kg)   LMP 05/21/1998   SpO2 98%   BMI 27.62 kg/m  Wt Readings from Last 3 Encounters:  10/28/19 166 lb (75.3 kg)  10/21/18 165 lb 6.4 oz (75 kg)  05/20/18 173 lb 9.6 oz (78.7 kg)     Lab Results  Component Value Date   WBC 5.0 02/18/2018   HGB 14.8 02/18/2018   HCT 43.9 02/18/2018   PLT 215.0 02/18/2018   GLUCOSE 88 10/21/2018   CHOL 160 10/21/2018   TRIG 47.0 10/21/2018   HDL 63.30 10/21/2018   LDLCALC 88 10/21/2018   ALT 18 10/21/2018   AST 19 10/21/2018   NA 140 10/21/2018   K 4.9 10/21/2018   CL 101 10/21/2018   CREATININE 0.69 10/21/2018   BUN 15 10/21/2018   CO2 33 (H) 10/21/2018   TSH  0.77 02/18/2018   INR 0.98 02/22/2015   HGBA1C 5.5 11/10/2016    MM DIGITAL SCREENING BILATERAL  Result Date: 08/25/2017 CLINICAL DATA:  Screening. EXAM: DIGITAL SCREENING BILATERAL MAMMOGRAM WITH CAD COMPARISON:  Previous exam(s). ACR Breast Density Category b: There are scattered areas of fibroglandular density. FINDINGS: There are no findings suspicious for malignancy. Images were processed with CAD. IMPRESSION: No mammographic evidence of malignancy. A result letter of this screening mammogram will be mailed directly to the patient. RECOMMENDATION: Screening mammogram in one year. (Code:SM-B-01Y) BI-RADS CATEGORY  1: Negative. Electronically Signed   By: Evangeline Dakin M.D.   On: 08/25/2017 14:17       Assessment & Plan:   Problem List Items Addressed This Visit    Health care maintenance    Physical today 10/28/19.  Mammogram overdue.  Schedule.  Overdue colonoscopy.  She wants to hold - given covid.  Will notify me when agreeable.        Hypertension    Blood pressure as outlined.  Continues on losartan - low dose. Did not tolerate increased dose.  Follow pressures.  Follow metabolic panel.  Continue losartan.        Neck pain    Neck pain as outlined.  Has seen ortho.  Note reviewed.  Has seen chiropractor.  Better.  Has noticed better when on vacation and when off.  Discussed muscle relaxer. She has taken flexeril previously and tolerated.  Discussed possible side effects.  Discussed not driving after taking.  Follow.  Other Visit Diagnoses    Encounter for screening mammogram for malignant neoplasm of breast    -  Primary   Relevant Orders   MM 3D SCREEN BREAST BILATERAL       Dale Navarre, MD

## 2019-10-30 ENCOUNTER — Encounter: Payer: Self-pay | Admitting: Internal Medicine

## 2019-10-30 NOTE — Assessment & Plan Note (Signed)
Blood pressure as outlined.  Continues on losartan - low dose. Did not tolerate increased dose.  Follow pressures.  Follow metabolic panel.  Continue losartan.

## 2019-10-30 NOTE — Assessment & Plan Note (Signed)
Neck pain as outlined.  Has seen ortho.  Note reviewed.  Has seen chiropractor.  Better.  Has noticed better when on vacation and when off.  Discussed muscle relaxer. She has taken flexeril previously and tolerated.  Discussed possible side effects.  Discussed not driving after taking.  Follow.

## 2019-11-11 ENCOUNTER — Other Ambulatory Visit: Payer: Self-pay

## 2019-11-11 ENCOUNTER — Other Ambulatory Visit: Payer: Managed Care, Other (non HMO)

## 2019-11-11 DIAGNOSIS — I10 Essential (primary) hypertension: Secondary | ICD-10-CM | POA: Diagnosis not present

## 2019-11-12 LAB — COMPREHENSIVE METABOLIC PANEL
ALT: 16 IU/L (ref 0–32)
AST: 23 IU/L (ref 0–40)
Albumin/Globulin Ratio: 2.4 — ABNORMAL HIGH (ref 1.2–2.2)
Albumin: 4.5 g/dL (ref 3.8–4.8)
Alkaline Phosphatase: 72 IU/L (ref 48–121)
BUN/Creatinine Ratio: 25 (ref 12–28)
BUN: 14 mg/dL (ref 8–27)
Bilirubin Total: 0.3 mg/dL (ref 0.0–1.2)
CO2: 26 mmol/L (ref 20–29)
Calcium: 9.7 mg/dL (ref 8.7–10.3)
Chloride: 101 mmol/L (ref 96–106)
Creatinine, Ser: 0.57 mg/dL (ref 0.57–1.00)
GFR calc Af Amer: 109 mL/min/{1.73_m2} (ref >59)
GFR calc non Af Amer: 94 mL/min/{1.73_m2} (ref >59)
Globulin, Total: 1.9 g/dL (ref 1.5–4.5)
Glucose: 97 mg/dL (ref 65–99)
Potassium: 5 mmol/L (ref 3.5–5.2)
Sodium: 139 mmol/L (ref 134–144)
Total Protein: 6.4 g/dL (ref 6.0–8.5)

## 2019-11-12 LAB — CBC WITH DIFFERENTIAL/PLATELET
Basophils Absolute: 0 10*3/uL (ref 0.0–0.2)
Basos: 1 %
EOS (ABSOLUTE): 0.2 10*3/uL (ref 0.0–0.4)
Eos: 4 %
Hematocrit: 44.1 % (ref 34.0–46.6)
Hemoglobin: 14.2 g/dL (ref 11.1–15.9)
Immature Grans (Abs): 0 10*3/uL (ref 0.0–0.1)
Immature Granulocytes: 0 %
Lymphocytes Absolute: 1.3 10*3/uL (ref 0.7–3.1)
Lymphs: 32 %
MCH: 29.6 pg (ref 26.6–33.0)
MCHC: 32.2 g/dL (ref 31.5–35.7)
MCV: 92 fL (ref 79–97)
Monocytes Absolute: 0.4 10*3/uL (ref 0.1–0.9)
Monocytes: 11 %
Neutrophils Absolute: 2.1 10*3/uL (ref 1.4–7.0)
Neutrophils: 52 %
Platelets: 223 10*3/uL (ref 150–450)
RBC: 4.79 x10E6/uL (ref 3.77–5.28)
RDW: 12.7 % (ref 11.7–15.4)
WBC: 4.1 10*3/uL (ref 3.4–10.8)

## 2019-11-12 LAB — LIPID PANEL
Chol/HDL Ratio: 2.2 ratio (ref 0.0–4.4)
Cholesterol, Total: 157 mg/dL (ref 100–199)
HDL: 71 mg/dL (ref >39)
LDL Chol Calc (NIH): 74 mg/dL (ref 0–99)
Triglycerides: 60 mg/dL (ref 0–149)
VLDL Cholesterol Cal: 12 mg/dL (ref 5–40)

## 2019-11-12 LAB — TSH: TSH: 1.56 u[IU]/mL (ref 0.450–4.500)

## 2019-11-14 ENCOUNTER — Other Ambulatory Visit: Payer: Self-pay | Admitting: Internal Medicine

## 2019-11-14 DIAGNOSIS — E875 Hyperkalemia: Secondary | ICD-10-CM

## 2019-11-14 NOTE — Progress Notes (Signed)
Order placed for f/u potassium.  

## 2019-12-16 ENCOUNTER — Telehealth: Payer: Self-pay | Admitting: Internal Medicine

## 2019-12-16 NOTE — Telephone Encounter (Signed)
Received notice - overdue mammogram.  Need to schedule.  Thanks.

## 2019-12-19 ENCOUNTER — Other Ambulatory Visit: Payer: Self-pay

## 2019-12-19 ENCOUNTER — Other Ambulatory Visit: Payer: Managed Care, Other (non HMO)

## 2019-12-19 DIAGNOSIS — E875 Hyperkalemia: Secondary | ICD-10-CM

## 2019-12-19 NOTE — Telephone Encounter (Signed)
Left detailed message for patient.

## 2019-12-19 NOTE — Addendum Note (Signed)
Addended by: Fabio Bering D on: 12/19/2019 09:15 AM   Modules accepted: Orders

## 2019-12-20 LAB — POTASSIUM: Potassium: 4.9 mmol/L (ref 3.5–5.2)

## 2020-01-19 ENCOUNTER — Ambulatory Visit (INDEPENDENT_AMBULATORY_CARE_PROVIDER_SITE_OTHER): Payer: Managed Care, Other (non HMO)

## 2020-01-19 ENCOUNTER — Other Ambulatory Visit: Payer: Self-pay

## 2020-01-19 ENCOUNTER — Encounter: Payer: Self-pay | Admitting: Family Medicine

## 2020-01-19 ENCOUNTER — Ambulatory Visit: Payer: Managed Care, Other (non HMO) | Admitting: Family Medicine

## 2020-01-19 ENCOUNTER — Ambulatory Visit: Payer: Self-pay

## 2020-01-19 VITALS — BP 144/98 | HR 60 | Ht 65.0 in | Wt 168.0 lb

## 2020-01-19 DIAGNOSIS — M25512 Pain in left shoulder: Secondary | ICD-10-CM | POA: Diagnosis not present

## 2020-01-19 DIAGNOSIS — M542 Cervicalgia: Secondary | ICD-10-CM | POA: Diagnosis not present

## 2020-01-19 DIAGNOSIS — M19011 Primary osteoarthritis, right shoulder: Secondary | ICD-10-CM

## 2020-01-19 MED ORDER — GABAPENTIN 100 MG PO CAPS
200.0000 mg | ORAL_CAPSULE | Freq: Every day | ORAL | 0 refills | Status: DC
Start: 2020-01-19 — End: 2020-03-06

## 2020-01-19 MED ORDER — PREDNISONE 20 MG PO TABS
20.0000 mg | ORAL_TABLET | Freq: Two times a day (BID) | ORAL | 0 refills | Status: DC
Start: 2020-01-19 — End: 2020-05-31

## 2020-01-19 NOTE — Patient Instructions (Addendum)
Xray today Prednisone 20mg  for 5 days Gabapentin 1-200 mg at night See me in 6 weeks if not better will get MRI

## 2020-01-19 NOTE — Progress Notes (Addendum)
[name] [address] [place] [address] Phone: [phone] Subjective:    I'm seeing this patient by the request  of:  Dale [place], MD  CC: Bilateral shoulder pain follow-up  [id number]  Dawn Vargas is a 69 y.o. female coming in with complaint of left and right shoulder pain that is secondary to her neck pain. Unable to sleep. Using Biofreeze, melatonin and Tylenol. Patient last seen in 2018 for right shoulder pain. Patient states that she has had chiropractic care and used prednisone since we last saw her for her neck pain. Both helped to alleviate her pain.   Cervical xray 2019 IMPRESSION: 1. No fracture, spondylolisthesis or acute finding. 2. Mild reversal the normal cervical lordosis. Disc degenerative changes of the mid to lower cervical spine most prominent at C4-C5.     Past Medical History:  Diagnosis Date  . Allergic asthma   . Allergic rhinitis   . Arthritis   . Chronic back pain    s/p surgery for herniated disc 93/89)  . Chronic back pain   . Complication of anesthesia   . Frequent UTI   . GERD (gastroesophageal reflux disease)    pt. states no symptoms  . Heart murmur    history of  . History of bronchitis   . History of kidney stones   . History of shingles   . Hypertension   . Hypertension   . PONV (postoperative nausea and vomiting)    pt. states history of nausea in 1989  . Reactive airway disease   . Rotator cuff tear    left shoulder- pt. states healing   Past Surgical History:  Procedure Laterality Date  . BACK SURGERY    . CHOLECYSTECTOMY  August 25 2012  . COLONOSCOPY  2013  . ESOPHAGOGASTRODUODENOSCOPY  01/2011  . EYE SURGERY    . laporoscopy  08/22/1997  . VAGINAL HYSTERECTOMY  1999   laparoscopic assisted   Social History   Socioeconomic History  . Marital status: Single    Spouse name: Not on file  . Number of children: Not on file  . Years of education: Not on file  . Highest  education level: Not on file  Occupational History  . Not on file  Tobacco Use  . Smoking status: Never Smoker  . Smokeless tobacco: Never Used  Substance and Sexual Activity  . Alcohol use: Yes    Alcohol/week: 0.0 standard drinks    Comment: socially  . Drug use: No  . Sexual activity: Not on file  Other Topics Concern  . Not on file  Social History Narrative  . Not on file   Social Determinants of Health   Financial Resource Strain:   . Difficulty of Paying Living Expenses:   Food Insecurity:   . Worried About Programme researcher, broadcasting/film/video in the Last Year:   . Barista in the Last Year:   Transportation Needs:   . Freight forwarder (Medical):   Marland Kitchen Lack of Transportation (Non-Medical):   Physical Activity:   . Days of Exercise per Week:   . Minutes of Exercise per Session:   Stress:   . Feeling of Stress :   Social Connections:   . Frequency of Communication with Friends and Family:   . Frequency of Social Gatherings with Friends and Family:   . Attends Religious Services:   . Active Member of Clubs or Organizations:   . Attends Banker Meetings:   .  Marital Status:    Allergies  Allergen Reactions  . Other Other (See Comments)    Smoke - severe reactive airway disease   . Benadryl [Diphenhydramine Hcl] Hives  . Oxycodone Other (See Comments)    dizzy  . Beef-Derived Products Other (See Comments)    Gi upset  . Caladryl [Pramoxine-Calamine] Hives  . Clarithromycin Other (See Comments)    Gi upset  . Cleocin [Clindamycin Hcl] Other (See Comments)    GI upset   . Eggs Or Egg-Derived Products Diarrhea  . Hydrochlorothiazide Other (See Comments)    dizzy  . Pegademase Bovine Nausea And Vomiting    Gi upset  . Soybean-Containing Drug Products Diarrhea  . Tomato Other (See Comments)    Small amount is okay, large amount causes diarrhea - Also potatoes   Family History  Problem Relation Age of Onset  . Dementia Father   . Prostate cancer  Father   . Heart disease Mother        myocardial infarction, congestive heart failure  . Hypertension Mother   . Heart disease Brother        angioplasty  . Hypertension Brother   . Hypertension Sister   . Breast cancer Cousin        maternal side-50's  . Colon cancer Neg Hx     Current Outpatient Medications (Endocrine & Metabolic):  .  predniSONE (DELTASONE) 20 MG tablet, Take 1 tablet (20 mg total) by mouth 2 (two) times daily.  Current Outpatient Medications (Cardiovascular):  .  losartan (COZAAR) 25 MG tablet, Take 1 tablet by mouth twice daily   Current Outpatient Medications (Analgesics):  .  acetaminophen (TYLENOL ARTHRITIS PAIN) 650 MG CR tablet, Take 1,300 mg by mouth at bedtime.   Current Outpatient Medications (Other):  Marland Kitchen  B Complex-C (SUPER B COMPLEX PO), Take 1 tablet by mouth daily. Marland Kitchen  CRANBERRY PO, Take 1 capsule by mouth daily. .  cyclobenzaprine (FLEXERIL) 5 MG tablet, Take 1 tablet (5 mg total) by mouth at bedtime as needed for muscle spasms. Marland Kitchen  gabapentin (NEURONTIN) 100 MG capsule, Take 2 capsules (200 mg total) by mouth at bedtime. .  Glucosamine-Chondroitin-MSM-D (TRIPLE FLEX/VITAMIN D3) TABS, Take 1 tablet by mouth daily. .  Omega-3 Fatty Acids (FISH OIL PO), Take 1 capsule by mouth daily. Marland Kitchen  OVER THE COUNTER MEDICATION, Take 500 mg by mouth daily. Tumeric 500mg  .  Probiotic Product (PROBIOTIC PEARLS PO), Take 1 capsule by mouth daily.   Reviewed prior external information including notes and imaging from  primary care provider As well as notes that were available from care everywhere and other healthcare systems.  Past medical history, social, surgical and family history all reviewed in electronic medical record.  No pertanent information unless stated regarding to the chief complaint.   Review of Systems:  No headache, visual changes, nausea, vomiting, diarrhea, constipation, dizziness, abdominal pain, skin rash, fevers, chills, night sweats,  weight loss, swollen lymph nodes,  joint swelling, chest pain, shortness of breath, mood changes. POSITIVE muscle aches, body aches  Objective  Blood pressure (!) 144/98, pulse 60, height 5\' 5"  (1.651 m), weight 168 lb (76.2 kg), last menstrual period 05/21/1998, SpO2 97 %.   General: No apparent distress alert and oriented x3 mood and affect normal, dressed appropriately.  HEENT: Pupils equal, extraocular movements intact  Respiratory: Patient's speak in full sentences and does not appear short of breath  Gait antalgic Neck exam does show some loss of lordosis.  Patient does have  some positive Spurling's on the left side.  Mild weakness in the C7 distribution in the C6 distribution on the left side though noted.  Patient does have a very mild increase in symptoms even with just regular side bending to the neck to the right greater than 5 degrees.  Lacks last 5 degrees of extension in the last 5 degrees of flexion.  Shoulder exams bilaterally do show some mild atrophy but great range of motion at the moment.  Rotator cuffs appear to be 4+ out of 5 strength and symmetric.  With mild crepitus noted.  Crossover with tenderness over the acromioclavicular joint on the right side      Impression and Recommendations:     The above documentation has been reviewed and is accurate and complete Judi Saa, DO       Note: This dictation was prepared with Dragon dictation along with smaller phrase technology. Any transcriptional errors that result from this process are unintentional.

## 2020-01-19 NOTE — Assessment & Plan Note (Signed)
I believe the patient will have more degenerative disc disease of the cervical spine.  Likely causing more the radiculopathy and does have some weakness on the C6 and C7 distribution on the left side.  X-rays are pending, discussed different medications including the possibility of the gabapentin that I think will be more beneficial.  Patient does have a muscle relaxer with the cyclobenzaprine as well.  Home exercises given and work with athletic trainer patient does have some pathology of the shoulders but I think it is unlikely that that that is the contributing factor today.  Follow-up with me again in 4 to 8 weeks

## 2020-01-23 ENCOUNTER — Other Ambulatory Visit: Payer: Self-pay

## 2020-01-23 ENCOUNTER — Ambulatory Visit
Admission: RE | Admit: 2020-01-23 | Discharge: 2020-01-23 | Disposition: A | Discharge status: Home / Self Care | Payer: Managed Care, Other (non HMO) | Source: Ambulatory Visit | Attending: Internal Medicine | Admitting: Internal Medicine

## 2020-01-23 DIAGNOSIS — Z1231 Encounter for screening mammogram for malignant neoplasm of breast: Secondary | ICD-10-CM | POA: Insufficient documentation

## 2020-02-17 ENCOUNTER — Telehealth: Payer: Self-pay | Admitting: Internal Medicine

## 2020-02-17 NOTE — Telephone Encounter (Signed)
Form placed up front for pick up.

## 2020-02-17 NOTE — Telephone Encounter (Signed)
Signed and placed in box.   

## 2020-02-17 NOTE — Telephone Encounter (Signed)
I have her form completed. She was supposed to let me know when she had her labs done. Just needs signature now.

## 2020-02-17 NOTE — Telephone Encounter (Signed)
Patient saw Dr. Lorin Picket on 10/28/19 for a wellness visit. Patient gave Dr. Lorin Picket or CMA a [employer] screening form to be completed by Dr. Lorin Picket. Dr Lorin Picket  said she would hold on to form  until labs came in. Patient never  received the form in the mail or by fax. Please call her.

## 2020-03-06 ENCOUNTER — Encounter: Payer: Self-pay | Admitting: Family Medicine

## 2020-03-06 ENCOUNTER — Ambulatory Visit: Payer: Managed Care, Other (non HMO) | Admitting: Family Medicine

## 2020-03-06 ENCOUNTER — Other Ambulatory Visit: Payer: Self-pay

## 2020-03-06 VITALS — BP 124/82 | HR 52 | Ht 65.0 in | Wt 167.0 lb

## 2020-03-06 DIAGNOSIS — M5030 Other cervical disc degeneration, unspecified cervical region: Secondary | ICD-10-CM | POA: Diagnosis not present

## 2020-03-06 DIAGNOSIS — M2550 Pain in unspecified joint: Secondary | ICD-10-CM | POA: Diagnosis not present

## 2020-03-06 MED ORDER — PREDNISONE 50 MG PO TABS
ORAL_TABLET | ORAL | 0 refills | Status: DC
Start: 2020-03-06 — End: 2020-05-31

## 2020-03-06 NOTE — Progress Notes (Signed)
[facility] [address] [place] [address] Phone: [phone] Subjective:   Dawn Vargas, am serving as a scribe for Dr. Antoine Vargas. This visit occurred during the SARS-CoV-2 public health emergency.  Safety protocols were in place, including screening questions prior to the visit, additional usage of staff PPE, and extensive cleaning of exam room while observing appropriate contact time as indicated for disinfecting solutions.   I'm seeing this patient by the request  of:  Dawn Hartford, MD  CC: Neck pain with mild shoulder pain bilaterally  [id number]   01/19/2020 I believe the patient will have more degenerative disc disease of the cervical spine.  Likely causing more the radiculopathy and does have some weakness on the C6 and C7 distribution on the left side.  X-rays are pending, discussed different medications including the possibility of the gabapentin that I think will be more beneficial.  Patient does have a muscle relaxer with the cyclobenzaprine as well.  Home exercises given and work with athletic trainer patient does have some pathology of the shoulders but I think it is unlikely that that that is the contributing factor today.  Follow-up with me again in 4 to 8 weeks  Update 03/06/2020 Dawn Vargas is a 69 y.o. female coming in with complaint of left shoulder and neck pain. Patient states that the prednisone took away her sharp pain. Pain is 7/10 especially when lying down at night. Patient has been working on HEP but does not feel that it is helping. Unable to take gabapentin as she experiencing dizziness and grogginess.    Xray 01/19/2020 IMPRESSION: 1. Unchanged reversal of the normal cervical lordosis and degenerative changes of the cervical spine. No acute bony abnormality. 2. Moderate to severe degenerative disc disease C3-C7, most severe at C4-5.      Past Medical History:  Diagnosis Date  . Allergic asthma   .  Allergic rhinitis   . Arthritis   . Chronic back pain    s/p surgery for herniated disc 93/89)  . Chronic back pain   . Complication of anesthesia   . Frequent UTI   . GERD (gastroesophageal reflux disease)    pt. states no symptoms  . Heart murmur    history of  . History of bronchitis   . History of kidney stones   . History of shingles   . Hypertension   . Hypertension   . PONV (postoperative nausea and vomiting)    pt. states history of nausea in 1989  . Reactive airway disease   . Rotator cuff tear    left shoulder- pt. states healing   Past Surgical History:  Procedure Laterality Date  . BACK SURGERY    . CHOLECYSTECTOMY  August 25 2012  . COLONOSCOPY  2013  . ESOPHAGOGASTRODUODENOSCOPY  01/2011  . EYE SURGERY    . laporoscopy  08/22/1997  . VAGINAL HYSTERECTOMY  1999   laparoscopic assisted   Social History   Socioeconomic History  . Marital status: Single    Spouse name: Not on file  . Number of children: Not on file  . Years of education: Not on file  . Highest education level: Not on file  Occupational History  . Not on file  Tobacco Use  . Smoking status: Never Smoker  . Smokeless tobacco: Never Used  Substance and Sexual Activity  . Alcohol use: Yes    Alcohol/week: 0.0 standard drinks    Comment: socially  . Drug use:  No  . Sexual activity: Not on file  Other Topics Concern  . Not on file  Social History Narrative  . Not on file   Social Determinants of Health   Financial Resource Strain:   . Difficulty of Paying Living Expenses: Not on file  Food Insecurity:   . Worried About Programme researcher, broadcasting/film/video in the Last Year: Not on file  . Ran Out of Food in the Last Year: Not on file  Transportation Needs:   . Lack of Transportation (Medical): Not on file  . Lack of Transportation (Non-Medical): Not on file  Physical Activity:   . Days of Exercise per Week: Not on file  . Minutes of Exercise per Session: Not on file  Stress:   . Feeling of Stress  : Not on file  Social Connections:   . Frequency of Communication with Friends and Family: Not on file  . Frequency of Social Gatherings with Friends and Family: Not on file  . Attends Religious Services: Not on file  . Active Member of Clubs or Organizations: Not on file  . Attends Banker Meetings: Not on file  . Marital Status: Not on file   Allergies  Allergen Reactions  . Other Other (See Comments)    Smoke - severe reactive airway disease   . Benadryl [Diphenhydramine Hcl] Hives  . Oxycodone Other (See Comments)    dizzy  . Beef-Derived Products Other (See Comments)    Gi upset  . Caladryl [Pramoxine-Calamine] Hives  . Clarithromycin Other (See Comments)    Gi upset  . Cleocin [Clindamycin Hcl] Other (See Comments)    GI upset   . Eggs Or Egg-Derived Products Diarrhea  . Hydrochlorothiazide Other (See Comments)    dizzy  . Pegademase Bovine Nausea And Vomiting    Gi upset  . Soybean-Containing Drug Products Diarrhea  . Tomato Other (See Comments)    Small amount is okay, large amount causes diarrhea - Also potatoes   Family History  Problem Relation Age of Onset  . Dementia Father   . Prostate cancer Father   . Heart disease Mother        myocardial infarction, congestive heart failure  . Hypertension Mother   . Heart disease Brother        angioplasty  . Hypertension Brother   . Hypertension Sister   . Breast cancer Cousin        maternal side-50's  . Colon cancer Neg Hx     Current Outpatient Medications (Endocrine & Metabolic):  .  predniSONE (DELTASONE) 20 MG tablet, Take 1 tablet (20 mg total) by mouth 2 (two) times daily. .  predniSONE (DELTASONE) 50 MG tablet, Take one tablet daily for the next 5 days.  Current Outpatient Medications (Cardiovascular):  .  losartan (COZAAR) 25 MG tablet, Take 1 tablet by mouth twice daily   Current Outpatient Medications (Analgesics):  .  acetaminophen (TYLENOL ARTHRITIS PAIN) 650 MG CR tablet, Take  1,300 mg by mouth at bedtime.   Current Outpatient Medications (Other):  Marland Kitchen  B Complex-C (SUPER B COMPLEX PO), Take 1 tablet by mouth daily. Marland Kitchen  CRANBERRY PO, Take 1 capsule by mouth daily. .  cyclobenzaprine (FLEXERIL) 5 MG tablet, Take 1 tablet (5 mg total) by mouth at bedtime as needed for muscle spasms. .  Glucosamine-Chondroitin-MSM-D (TRIPLE FLEX/VITAMIN D3) TABS, Take 1 tablet by mouth daily. .  Omega-3 Fatty Acids (FISH OIL PO), Take 1 capsule by mouth daily. Marland Kitchen  OVER THE  COUNTER MEDICATION, Take 500 mg by mouth daily. Tumeric 500mg  .  Probiotic Product (PROBIOTIC PEARLS PO), Take 1 capsule by mouth daily.   Reviewed prior external information including notes and imaging from  primary care provider As well as notes that were available from care everywhere and other healthcare systems.  Past medical history, social, surgical and family history all reviewed in electronic medical record.  No pertanent information unless stated regarding to the chief complaint.   Review of Systems:  No headache, visual changes, nausea, vomiting, diarrhea, constipation, dizziness, abdominal pain, skin rash, fevers, chills, night sweats, weight loss, swollen lymph nodes, body aches, joint swelling, chest pain, shortness of breath, mood changes. POSITIVE muscle aches  Objective  Blood pressure 124/82, pulse (!) 52, height 5\' 5"  (1.651 m), weight 167 lb (75.8 kg), last menstrual period 05/21/1998, SpO2 99 %.   General: No apparent distress alert and oriented x3 mood and affect normal, dressed appropriately.  HEENT: Pupils equal, extraocular movements intact  Respiratory: Patient's speak in full sentences and does not appear short of breath  Cardiovascular: No lower extremity edema, non tender, no erythema  Neuro: Cranial nerves II through XII are intact, neurovascularly intact in all extremities with 2+ DTRs and 2+ pulses.  Gait normal with good balance and coordination.  MSK: Mild arthritic changes of  multiple joints Neck exam does have some loss of lordosis.  Only has extension of 5 degrees.  Patient does have mild crepitus.  Patient's shoulders all have improved and does have some mild strength about the periscapular region.    Impression and Recommendations:     The above documentation has been reviewed and is accurate and complete , DO       Note: This dictation was prepared with Dragon dictation along with smaller phrase technology. Any transcriptional errors that result from this process are unintentional.

## 2020-03-06 NOTE — Assessment & Plan Note (Signed)
Patient's x-rays previously have shown that patient does have significant degenerative changes of the neck.  Likely contributing to some of the bilateral shoulder pain as well.  Unable to tolerate the gabapentin.  Has Flexeril for breakthrough, patient does have made multiple allergies to medications so we will not add too much but has responded well to prednisone previously.  We will do 1 more round of prednisone, encouraged her to continue the home exercises.  Patient declined formal physical therapy, worsening pain will consider advanced imaging, follow-up again in 2 months

## 2020-03-06 NOTE — Patient Instructions (Addendum)
Prednisone for next 5 days Covid antibodies lab today See me in 7-8 weeks

## 2020-03-07 LAB — SARS COV-2 SEROLOGY(COVID-19)AB(IGG,IGM),IMMUNOASSAY
SARS CoV-2 AB IgG: NEGATIVE
SARS CoV-2 IgM: NEGATIVE

## 2020-03-14 ENCOUNTER — Other Ambulatory Visit: Payer: Self-pay | Admitting: Internal Medicine

## 2020-04-11 ENCOUNTER — Ambulatory Visit: Payer: Managed Care, Other (non HMO) | Admitting: Internal Medicine

## 2020-04-24 ENCOUNTER — Ambulatory Visit: Payer: Managed Care, Other (non HMO) | Admitting: Family Medicine

## 2020-04-24 NOTE — Progress Notes (Signed)
[name] [address] [place] [address] Phone: [phone] Subjective:   Wynema Birch, am serving as a scribe for Dr. Antoine Primas. This visit occurred during the SARS-CoV-2 public health emergency.  Safety protocols were in place, including screening questions prior to the visit, additional usage of staff PPE, and extensive cleaning of exam room while observing appropriate contact time as indicated for disinfecting solutions.   I'm seeing this patient by the request  of:  Dale Orofino, MD  CC: Neck pain follow-up  [id number]   03/06/2020 Patient's x-rays previously have shown that patient does have significant degenerative changes of the neck.  Likely contributing to some of the bilateral shoulder pain as well.  Unable to tolerate the gabapentin.  Has Flexeril for breakthrough, patient does have made multiple allergies to medications so we will not add too much but has responded well to prednisone previously.  We will do 1 more round of prednisone, encouraged her to continue the home exercises.  Patient declined formal physical therapy, worsening pain will consider advanced imaging, follow-up again in 2 months  Update 04/25/2020 MOLINA HOLLENBACK is a 69 y.o. female coming in with complaint of neck pain. Patient states that she took the first round of prednisone and it initially helped with the pain. She started doing the exercises and the pain came back so she took another round of prednisone and the pain actually got worse.  Patient states that this point it is affecting everything in her daily activities.  Affecting her sleep, she notices more radicular symptoms.  Symptoms feels like she has a weakness in her grip.       Past Medical History:  Diagnosis Date  . Allergic asthma   . Allergic rhinitis   . Arthritis   . Chronic back pain    s/p surgery for herniated disc 93/89)  . Chronic back pain   . Complication of anesthesia   .  Frequent UTI   . GERD (gastroesophageal reflux disease)    pt. states no symptoms  . Heart murmur    history of  . History of bronchitis   . History of kidney stones   . History of shingles   . Hypertension   . Hypertension   . PONV (postoperative nausea and vomiting)    pt. states history of nausea in 1989  . Reactive airway disease   . Rotator cuff tear    left shoulder- pt. states healing   Past Surgical History:  Procedure Laterality Date  . BACK SURGERY    . CHOLECYSTECTOMY  August 25 2012  . COLONOSCOPY  2013  . ESOPHAGOGASTRODUODENOSCOPY  01/2011  . EYE SURGERY    . laporoscopy  08/22/1997  . VAGINAL HYSTERECTOMY  1999   laparoscopic assisted   Social History   Socioeconomic History  . Marital status: Single    Spouse name: Not on file  . Number of children: Not on file  . Years of education: Not on file  . Highest education level: Not on file  Occupational History  . Not on file  Tobacco Use  . Smoking status: Never Smoker  . Smokeless tobacco: Never Used  Substance and Sexual Activity  . Alcohol use: Yes    Alcohol/week: 0.0 standard drinks    Comment: socially  . Drug use: No  . Sexual activity: Not on file  Other Topics Concern  . Not on file  Social History Narrative  . Not on file  Social Determinants of Health   Financial Resource Strain:   . Difficulty of Paying Living Expenses: Not on file  Food Insecurity:   . Worried About Programme researcher, broadcasting/film/video in the Last Year: Not on file  . Ran Out of Food in the Last Year: Not on file  Transportation Needs:   . Lack of Transportation (Medical): Not on file  . Lack of Transportation (Non-Medical): Not on file  Physical Activity:   . Days of Exercise per Week: Not on file  . Minutes of Exercise per Session: Not on file  Stress:   . Feeling of Stress : Not on file  Social Connections:   . Frequency of Communication with Friends and Family: Not on file  . Frequency of Social Gatherings with Friends and  Family: Not on file  . Attends Religious Services: Not on file  . Active Member of Clubs or Organizations: Not on file  . Attends Banker Meetings: Not on file  . Marital Status: Not on file   Allergies  Allergen Reactions  . Other Other (See Comments)    Smoke - severe reactive airway disease   . Benadryl [Diphenhydramine Hcl] Hives  . Oxycodone Other (See Comments)    dizzy  . Beef-Derived Products Other (See Comments)    Gi upset  . Caladryl [Pramoxine-Calamine] Hives  . Clarithromycin Other (See Comments)    Gi upset  . Cleocin [Clindamycin Hcl] Other (See Comments)    GI upset   . Eggs Or Egg-Derived Products Diarrhea  . Hydrochlorothiazide Other (See Comments)    dizzy  . Pegademase Bovine Nausea And Vomiting    Gi upset  . Soybean-Containing Drug Products Diarrhea  . Tomato Other (See Comments)    Small amount is okay, large amount causes diarrhea - Also potatoes   Family History  Problem Relation Age of Onset  . Dementia Father   . Prostate cancer Father   . Heart disease Mother        myocardial infarction, congestive heart failure  . Hypertension Mother   . Heart disease Brother        angioplasty  . Hypertension Brother   . Hypertension Sister   . Breast cancer Cousin        maternal side-50's  . Colon cancer Neg Hx     Current Outpatient Medications (Endocrine & Metabolic):  .  predniSONE (DELTASONE) 20 MG tablet, Take 1 tablet (20 mg total) by mouth 2 (two) times daily. (Patient not taking: Reported on 04/25/2020) .  predniSONE (DELTASONE) 50 MG tablet, Take one tablet daily for the next 5 days. (Patient not taking: Reported on 04/25/2020)  Current Outpatient Medications (Cardiovascular):  .  losartan (COZAAR) 25 MG tablet, Take 1 tablet by mouth twice daily   Current Outpatient Medications (Analgesics):  .  acetaminophen (TYLENOL ARTHRITIS PAIN) 650 MG CR tablet, Take 1,300 mg by mouth at bedtime.   Current Outpatient Medications  (Other):  Marland Kitchen  B Complex-C (SUPER B COMPLEX PO), Take 1 tablet by mouth daily. Marland Kitchen  CRANBERRY PO, Take 1 capsule by mouth daily. .  Glucosamine-Chondroitin-MSM-D (TRIPLE FLEX/VITAMIN D3) TABS, Take 1 tablet by mouth daily. .  Omega-3 Fatty Acids (FISH OIL PO), Take 1 capsule by mouth daily. Marland Kitchen  OVER THE COUNTER MEDICATION, Take 500 mg by mouth daily. Tumeric 500mg  .  Probiotic Product (PROBIOTIC PEARLS PO), Take 1 capsule by mouth daily. .  cyclobenzaprine (FLEXERIL) 5 MG tablet, Take 1 tablet (5 mg total) by mouth  at bedtime as needed for muscle spasms. (Patient not taking: Reported on 04/25/2020)   Reviewed prior external information including notes and imaging from  primary care provider As well as notes that were available from care everywhere and other healthcare systems.  Past medical history, social, surgical and family history all reviewed in electronic medical record.  No pertanent information unless stated regarding to the chief complaint.   Review of Systems:  No headache, visual changes, nausea, vomiting, diarrhea, constipation, dizziness, abdominal pain, skin rash, fevers, chills, night sweats, weight loss, swollen lymph nodes, body aches, joint swelling, chest pain, shortness of breath, mood changes. POSITIVE muscle aches  Objective  Blood pressure 130/84, pulse 66, height 5\' 5"  (1.651 m), weight 167 lb (75.8 kg), last menstrual period 05/21/1998, SpO2 95 %.   General: No apparent distress alert and oriented x3 mood and affect normal, dressed appropriately.  HEENT: Pupils equal, extraocular movements intact  Respiratory: Patient's speak in full sentences and does not appear short of breath  Cardiovascular: No lower extremity edema, non tender, no erythema  Neck exam shows some loss of lordosis.  Patient does have crepitus.  Mild radicular symptoms down the right arm in the C7 distribution.  4-5 grip strength noted bilaterally.    Impression and Recommendations:     The above  documentation has been reviewed and is accurate and complete 05/23/1998, DO

## 2020-04-25 ENCOUNTER — Other Ambulatory Visit: Payer: Self-pay

## 2020-04-25 ENCOUNTER — Ambulatory Visit: Payer: Managed Care, Other (non HMO) | Admitting: Family Medicine

## 2020-04-25 ENCOUNTER — Encounter: Payer: Self-pay | Admitting: Family Medicine

## 2020-04-25 VITALS — BP 130/84 | HR 66 | Ht 65.0 in | Wt 167.0 lb

## 2020-04-25 DIAGNOSIS — M542 Cervicalgia: Secondary | ICD-10-CM | POA: Diagnosis not present

## 2020-04-25 DIAGNOSIS — M5030 Other cervical disc degeneration, unspecified cervical region: Secondary | ICD-10-CM | POA: Diagnosis not present

## 2020-04-25 NOTE — Assessment & Plan Note (Signed)
Degenerative disc disease.  Patient has severe.  Does have some radicular symptoms.  Concern for more of a nerve impingement.  Has failed everything else and secondary to the amount of allergies she has she does not want to start other medications at this time.  She would be a candidate for an epidural steroid injection.  Patient will have an MRI done and then we will further evaluate.  Patient is in agreement with the plan and will follow up after the imaging.

## 2020-04-25 NOTE — Patient Instructions (Signed)
Good to see you  MRI ordered of cervical spine Call [phone] to schedule See me again in 5 weeks

## 2020-05-13 ENCOUNTER — Ambulatory Visit
Admission: RE | Admit: 2020-05-13 | Discharge: 2020-05-13 | Disposition: A | Discharge status: Home / Self Care | Payer: Managed Care, Other (non HMO) | Source: Ambulatory Visit | Attending: Family Medicine | Admitting: Family Medicine

## 2020-05-13 ENCOUNTER — Other Ambulatory Visit: Payer: Self-pay

## 2020-05-13 DIAGNOSIS — M5030 Other cervical disc degeneration, unspecified cervical region: Secondary | ICD-10-CM

## 2020-05-13 DIAGNOSIS — M542 Cervicalgia: Secondary | ICD-10-CM

## 2020-05-30 NOTE — Progress Notes (Signed)
[name] [address] [place] [address] Phone: [phone] Subjective:   I Dawn Vargas am serving as a Neurosurgeon for Dr. Antoine Primas.   This visit occurred during the SARS-CoV-2 public health emergency.  Safety protocols were in place, including screening questions prior to the visit, additional usage of staff PPE, and extensive cleaning of exam room while observing appropriate contact time as indicated for disinfecting solutions.   I'm seeing this patient by the request  of:  Dale Stokes, MD  CC: Neck pain follow-up  UEK:[id number]   04/25/2020 Degenerative disc disease.  Patient has severe.  Does have some radicular symptoms.  Concern for more of a nerve impingement.  Has failed everything else and secondary to the amount of allergies she has she does not want to start other medications at this time.  She would be a candidate for an epidural steroid injection.  Patient will have an MRI done and then we will further evaluate.  Patient is in agreement with the plan and will follow up after the imaging.   Update 05/31/2020 Dawn Vargas is a 69 y.o. female coming in with complaint of cervical spine pain. Patient states she feels about the same.  Patient states that initially it did seem to get worse and now is kind of gotten a little better.  Patient states that the steroids did help with some of the radicular symptoms but continues to have both the neck pain.  MRI cervical 05/13/2020 IMPRESSION: 1. Moderate multilevel cervical spondylosis with resultant mild spinal stenosis at C4-5. No other significant canal stenosis within the cervical spine. 2. Multifactorial degenerative changes with resultant moderate bilateral C5, C6, and C7 foraminal narrowing as above. 3. Reactive marrow edema about the dens and anterior arch of C1 related to reactive osteoarthritic changes. Additional mild reactive marrow edema about the right C4-5 and left C7-T1  facets due to facet arthritis. Findings could contribute to underlying neck pain.     Past Medical History:  Diagnosis Date  . Allergic asthma   . Allergic rhinitis   . Arthritis   . Chronic back pain    s/p surgery for herniated disc 93/89)  . Chronic back pain   . Complication of anesthesia   . Frequent UTI   . GERD (gastroesophageal reflux disease)    pt. states no symptoms  . Heart murmur    history of  . History of bronchitis   . History of kidney stones   . History of shingles   . Hypertension   . Hypertension   . PONV (postoperative nausea and vomiting)    pt. states history of nausea in 1989  . Reactive airway disease   . Rotator cuff tear    left shoulder- pt. states healing   Past Surgical History:  Procedure Laterality Date  . BACK SURGERY    . CHOLECYSTECTOMY  August 25 2012  . COLONOSCOPY  2013  . ESOPHAGOGASTRODUODENOSCOPY  01/2011  . EYE SURGERY    . laporoscopy  08/22/1997  . VAGINAL HYSTERECTOMY  1999   laparoscopic assisted   Social History   Socioeconomic History  . Marital status: Single    Spouse name: Not on file  . Number of children: Not on file  . Years of education: Not on file  . Highest education level: Not on file  Occupational History  . Not on file  Tobacco Use  . Smoking status: Never Smoker  . Smokeless tobacco: Never Used  Substance and Sexual Activity  . Alcohol use: Yes    Alcohol/week: 0.0 standard drinks    Comment: socially  . Drug use: No  . Sexual activity: Not on file  Other Topics Concern  . Not on file  Social History Narrative  . Not on file   Social Determinants of Health   Financial Resource Strain: Not on file  Food Insecurity: Not on file  Transportation Needs: Not on file  Physical Activity: Not on file  Stress: Not on file  Social Connections: Not on file   Allergies  Allergen Reactions  . Other Other (See Comments)    Smoke - severe reactive airway disease   . Benadryl [Diphenhydramine Hcl]  Hives  . Oxycodone Other (See Comments)    dizzy  . Beef-Derived Products Other (See Comments)    Gi upset  . Caladryl [Pramoxine-Calamine] Hives  . Clarithromycin Other (See Comments)    Gi upset  . Cleocin [Clindamycin Hcl] Other (See Comments)    GI upset   . Eggs Or Egg-Derived Products Diarrhea  . Hydrochlorothiazide Other (See Comments)    dizzy  . Pegademase Bovine Nausea And Vomiting    Gi upset  . Soybean-Containing Drug Products Diarrhea  . Tomato Other (See Comments)    Small amount is okay, large amount causes diarrhea - Also potatoes   Family History  Problem Relation Age of Onset  . Dementia Father   . Prostate cancer Father   . Heart disease Mother        myocardial infarction, congestive heart failure  . Hypertension Mother   . Heart disease Brother        angioplasty  . Hypertension Brother   . Hypertension Sister   . Breast cancer Cousin        maternal side-50's  . Colon cancer Neg Hx     Current Outpatient Medications (Endocrine & Metabolic):  .  methylPREDNISolone (MEDROL DOSEPAK) 4 MG TBPK tablet, 6 pills day 1, 5 pills day 2, 4 pills day 3, 3 pills day 4, 2 pills day 5 and 1 pill thereafter  Current Outpatient Medications (Cardiovascular):  .  losartan (COZAAR) 25 MG tablet, Take 1 tablet by mouth twice daily   Current Outpatient Medications (Analgesics):  .  acetaminophen (TYLENOL) 650 MG CR tablet, Take 1,300 mg by mouth at bedtime.   Current Outpatient Medications (Other):  Marland Kitchen  B Complex-C (SUPER B COMPLEX PO), Take 1 tablet by mouth daily. Marland Kitchen  CRANBERRY PO, Take 1 capsule by mouth daily. .  cyclobenzaprine (FLEXERIL) 5 MG tablet, Take 1 tablet (5 mg total) by mouth at bedtime as needed for muscle spasms. .  Glucosamine-Chondroitin-MSM-D (TRIPLE FLEX/VITAMIN D3) TABS, Take 1 tablet by mouth daily. .  Omega-3 Fatty Acids (FISH OIL PO), Take 1 capsule by mouth daily. Marland Kitchen  OVER THE COUNTER MEDICATION, Take 500 mg by mouth daily. Tumeric 500mg  .   Probiotic Product (PROBIOTIC PEARLS PO), Take 1 capsule by mouth daily.   Reviewed prior external information including notes and imaging from  primary care provider As well as notes that were available from care everywhere and other healthcare systems.  Past medical history, social, surgical and family history all reviewed in electronic medical record.  No pertanent information unless stated regarding to the chief complaint.   Review of Systems:  No headache, visual changes, nausea, vomiting, diarrhea, constipation, dizziness, abdominal pain, skin rash, fevers, chills, night sweats, weight loss, swollen lymph nodes, body aches, joint swelling, chest pain, shortness  of breath, mood changes. POSITIVE muscle aches  Objective  Blood pressure (!) 156/90, pulse 66, height 5\' 5"  (1.651 m), weight 171 lb (77.6 kg), last menstrual period 05/21/1998, SpO2 91 %.   General: No apparent distress alert and oriented x3 mood and affect normal, dressed appropriately.  HEENT: Pupils equal, extraocular movements intact  Respiratory: Patient's speak in full sentences and does not appear short of breath  Cardiovascular: No lower extremity edema, non tender, no erythema  Neck exam does have some mild loss of lordosis.  Patient does have some degenerative audible popping noted.  Patient does have 5-5 strength of the upper extremities.  Patient has limited range of motion especially with side bending and rotation to the right.  Negative Spurling's noted today    Impression and Recommendations:     The above documentation has been reviewed and is accurate and complete 05/23/1998, DO

## 2020-05-31 ENCOUNTER — Encounter: Payer: Self-pay | Admitting: Family Medicine

## 2020-05-31 ENCOUNTER — Ambulatory Visit: Payer: Managed Care, Other (non HMO) | Admitting: Family Medicine

## 2020-05-31 ENCOUNTER — Other Ambulatory Visit: Payer: Self-pay

## 2020-05-31 DIAGNOSIS — M5030 Other cervical disc degeneration, unspecified cervical region: Secondary | ICD-10-CM | POA: Diagnosis not present

## 2020-05-31 MED ORDER — METHYLPREDNISOLONE 4 MG PO TBPK
ORAL_TABLET | ORAL | 0 refills | Status: DC
Start: 2020-05-31 — End: 2020-11-05

## 2020-05-31 NOTE — Assessment & Plan Note (Addendum)
Patient does have significant amount of reactive edema as well as known arthritic changes noted of the neck.  We discussed different treatment the patient would like to try the Medrol Dosepak.  Feels like she has responded to that more better than the prednisone previously.  We will do that for her but I encouraged her to consider the possibility of the epidural.  We have done that for the lower back and patient did not have great results.  We discussed with her otherwise the only other thing we have that is available is the potential for surgical intervention which patient would like to avoid.  Patient will follow up with me again 6 to 8 weeks total time reviewing patient's notes, imaging and discussing different treatment options with patient is 31 minutes

## 2020-05-31 NOTE — Patient Instructions (Addendum)
Good to see you Medrol dose pack Do not take any other antiinflammatories Start the exercises a week after taking the medication If worse consider epidural  See me again in 7-8 weeks

## 2020-07-19 ENCOUNTER — Ambulatory Visit: Payer: Managed Care, Other (non HMO) | Admitting: Family Medicine

## 2020-09-20 ENCOUNTER — Other Ambulatory Visit: Payer: Self-pay | Admitting: Internal Medicine

## 2020-10-30 ENCOUNTER — Ambulatory Visit (INDEPENDENT_AMBULATORY_CARE_PROVIDER_SITE_OTHER): Payer: Managed Care, Other (non HMO) | Admitting: Internal Medicine

## 2020-10-30 ENCOUNTER — Other Ambulatory Visit: Payer: Self-pay

## 2020-10-30 ENCOUNTER — Encounter: Payer: Self-pay | Admitting: Internal Medicine

## 2020-10-30 VITALS — BP 134/78 | HR 80 | Temp 97.0°F | Resp 16 | Ht 65.0 in | Wt 169.2 lb

## 2020-10-30 DIAGNOSIS — Z0000 Encounter for general adult medical examination without abnormal findings: Secondary | ICD-10-CM | POA: Diagnosis not present

## 2020-10-30 DIAGNOSIS — Z13220 Encounter for screening for lipoid disorders: Secondary | ICD-10-CM | POA: Diagnosis not present

## 2020-10-30 DIAGNOSIS — Z1231 Encounter for screening mammogram for malignant neoplasm of breast: Secondary | ICD-10-CM

## 2020-10-30 DIAGNOSIS — I10 Essential (primary) hypertension: Secondary | ICD-10-CM

## 2020-10-30 DIAGNOSIS — M5030 Other cervical disc degeneration, unspecified cervical region: Secondary | ICD-10-CM

## 2020-10-30 NOTE — Progress Notes (Signed)
Patient ID: Dawn Vargas, female   DOB: Jul 12, 1950, 70 y.o.   MRN: [id number]   Subjective:    Patient ID: Dawn Vargas, female    DOB: 10/30/1950, 70 y.o.   MRN: [id number]  HPI This visit occurred during the SARS-CoV-2 public health emergency.  Safety protocols were in place, including screening questions prior to the visit, additional usage of staff PPE, and extensive cleaning of exam room while observing appropriate contact time as indicated for disinfecting solutions.  Patient here for her physical exam.  Has been seen recently by Dr Katrinka Blazing for cervical spine pain.  S/p steroid taper and MRI as outlined.  Persistent increased pain. Pain in her neck that extends into both shoulders - Left > right.  Can extend down left arm as well.  States otherwise she is doing well. No chest pain or sob reported.  No acid reflux reported.  No abdominal pain.  Bowels moving.  Is watching her diet.  Decreased carb/sugar intake.  Stays active.    Past Medical History:  Diagnosis Date  . Allergic asthma   . Allergic rhinitis   . Arthritis   . Chronic back pain    s/p surgery for herniated disc 93/89)  . Chronic back pain   . Complication of anesthesia   . Frequent UTI   . GERD (gastroesophageal reflux disease)    pt. states no symptoms  . Heart murmur    history of  . History of bronchitis   . History of kidney stones   . History of shingles   . Hypertension   . Hypertension   . PONV (postoperative nausea and vomiting)    pt. states history of nausea in 1989  . Reactive airway disease   . Rotator cuff tear    left shoulder- pt. states healing   Past Surgical History:  Procedure Laterality Date  . BACK SURGERY    . CHOLECYSTECTOMY  August 25 2012  . COLONOSCOPY  2013  . ESOPHAGOGASTRODUODENOSCOPY  01/2011  . EYE SURGERY    . laporoscopy  08/22/1997  . VAGINAL HYSTERECTOMY  1999   laparoscopic assisted   Family History  Problem Relation Age of Onset  . Dementia Father   . Prostate  cancer Father   . Heart disease Mother        myocardial infarction, congestive heart failure  . Hypertension Mother   . Heart disease Brother        angioplasty  . Hypertension Brother   . Hypertension Sister   . Breast cancer Cousin        maternal side-50's  . Colon cancer Neg Hx    Social History   Socioeconomic History  . Marital status: Single    Spouse name: Not on file  . Number of children: Not on file  . Years of education: Not on file  . Highest education level: Not on file  Occupational History  . Not on file  Tobacco Use  . Smoking status: Never Smoker  . Smokeless tobacco: Never Used  Substance and Sexual Activity  . Alcohol use: Yes    Alcohol/week: 0.0 standard drinks    Comment: socially  . Drug use: No  . Sexual activity: Not on file  Other Topics Concern  . Not on file  Social History Narrative  . Not on file   Social Determinants of Health   Financial Resource Strain: Not on file  Food Insecurity: Not on file  Transportation Needs: Not on file  Physical Activity: Not on file  Stress: Not on file  Social Connections: Not on file    Outpatient Encounter Medications as of 10/30/2020  Medication Sig  . Turmeric 500 MG CAPS Take 500 mg by mouth daily.  Marland Kitchen acetaminophen (TYLENOL) 650 MG CR tablet Take 1,300 mg by mouth at bedtime.  . B Complex-C (SUPER B COMPLEX PO) Take 1 tablet by mouth daily.  Marland Kitchen CRANBERRY PO Take 1 capsule by mouth daily.  . Glucosamine-Chondroitin-MSM-D (TRIPLE FLEX/VITAMIN D3) TABS Take 1 tablet by mouth daily.  Marland Kitchen losartan (COZAAR) 25 MG tablet Take 1 tablet by mouth twice daily  . Omega-3 Fatty Acids (FISH OIL PO) Take 1 capsule by mouth daily.  . Probiotic Product (PROBIOTIC PEARLS PO) Take 1 capsule by mouth daily.  . [DISCONTINUED] cyclobenzaprine (FLEXERIL) 5 MG tablet Take 1 tablet (5 mg total) by mouth at bedtime as needed for muscle spasms. (Patient not taking: Reported on 10/30/2020)  . [DISCONTINUED]  methylPREDNISolone (MEDROL DOSEPAK) 4 MG TBPK tablet 6 pills day 1, 5 pills day 2, 4 pills day 3, 3 pills day 4, 2 pills day 5 and 1 pill thereafter (Patient not taking: Reported on 10/30/2020)  . [DISCONTINUED] OVER THE COUNTER MEDICATION Take 500 mg by mouth daily. Tumeric 500mg    No facility-administered encounter medications on file as of 10/30/2020.    Review of Systems  Constitutional: Negative for appetite change and unexpected weight change.  HENT: Negative for congestion, sinus pressure and sore throat.   Eyes: Negative for pain and visual disturbance.  Respiratory: Negative for cough, chest tightness and shortness of breath.   Cardiovascular: Negative for chest pain, palpitations and leg swelling.  Gastrointestinal: Negative for abdominal pain, constipation and diarrhea.  Genitourinary: Negative for difficulty urinating and dysuria.  Musculoskeletal: Positive for neck pain. Negative for joint swelling and myalgias.       Neck with shoulder/arm pain as outlined.   Skin: Negative for color change and rash.  Neurological: Negative for dizziness, light-headedness and headaches.  Hematological: Negative for adenopathy. Does not bruise/bleed easily.  Psychiatric/Behavioral: Negative for agitation and dysphoric mood.       Objective:    Physical Exam Vitals reviewed.  Constitutional:      General: She is not in acute distress.    Appearance: Normal appearance. She is well-developed.  HENT:     Head: Normocephalic and atraumatic.     Right Ear: External ear normal.     Left Ear: External ear normal.  Eyes:     General: No scleral icterus.       Right eye: No discharge.        Left eye: No discharge.     Conjunctiva/sclera: Conjunctivae normal.  Neck:     Thyroid: No thyromegaly.  Cardiovascular:     Rate and Rhythm: Normal rate and regular rhythm.  Pulmonary:     Effort: No tachypnea, accessory muscle usage or respiratory distress.     Breath sounds: Normal breath sounds.  No decreased breath sounds or wheezing.  Chest:  Breasts:     Right: No inverted nipple, mass, nipple discharge or tenderness (no axillary adenopathy).     Left: No inverted nipple, mass, nipple discharge or tenderness (no axilarry adenopathy).    Abdominal:     General: Bowel sounds are normal.     Palpations: Abdomen is soft.     Tenderness: There is no abdominal tenderness.  Musculoskeletal:        General: No swelling or tenderness.  Cervical back: Neck supple. No tenderness.  Lymphadenopathy:     Cervical: No cervical adenopathy.  Skin:    Findings: No erythema or rash.  Neurological:     Mental Status: She is alert and oriented to person, place, and time.  Psychiatric:        Mood and Affect: Mood normal.        Behavior: Behavior normal.     BP 134/78   Pulse 80   Temp (!) 97 F (36.1 C)   Resp 16   Ht 5\' 5"  (1.651 m)   Wt 169 lb 3.2 oz (76.7 kg)   LMP 05/21/1998   SpO2 98%   BMI 28.16 kg/m  Wt Readings from Last 3 Encounters:  10/30/20 169 lb 3.2 oz (76.7 kg)  05/31/20 171 lb (77.6 kg)  04/25/20 167 lb (75.8 kg)     Lab Results  Component Value Date   WBC 4.1 11/11/2019   HGB 14.2 11/11/2019   HCT 44.1 11/11/2019   PLT 223 11/11/2019   GLUCOSE 97 11/11/2019   CHOL 157 11/11/2019   TRIG 60 11/11/2019   HDL 71 11/11/2019   LDLCALC 74 11/11/2019   ALT 16 11/11/2019   AST 23 11/11/2019   NA 139 11/11/2019   K 4.9 12/19/2019   CL 101 11/11/2019   CREATININE 0.57 11/11/2019   BUN 14 11/11/2019   CO2 26 11/11/2019   TSH 1.560 11/11/2019   INR 0.98 02/22/2015   HGBA1C 5.5 11/10/2016    MR CERVICAL SPINE WO CONTRAST  Result Date: 05/13/2020 CLINICAL DATA:  Initial evaluation for chronic neck pain with radiation into the shoulders bilaterally. EXAM: MRI CERVICAL SPINE WITHOUT CONTRAST TECHNIQUE: Multiplanar, multisequence MR imaging of the cervical spine was performed. No intravenous contrast was administered. COMPARISON:  Comparison made with  previous MRI from 12/01/2010. FINDINGS: Alignment: Reversal of the normal cervical lordosis with apex at C4-5. Trace anterolisthesis of C3 on C4. Trace stepwise anterolisthesis of C7 on T1 through T1-T2. Findings are chronic and degenerative. Vertebrae: Vertebral body height maintained without acute or chronic fracture. Bone marrow signal intensity within normal limits. No discrete or worrisome osseous lesions. Mild reactive marrow edema seen about the dens and anterior arch of C1 related to reactive osteoarthritic changes. Mild reactive marrow edema also noted about the right C4-5 and left C7-T1 facets due to facet arthritis. No other abnormal marrow edema. Cord: Normal signal and morphology. Posterior Fossa, vertebral arteries, paraspinal tissues: Visualized brain and posterior fossa within normal limits. Paraspinous and prevertebral soft tissues are normal. Normal flow voids seen within the vertebral arteries bilaterally. Disc levels: C1-2: Degenerative changes with thickening of the tectorial membrane noted about the C1-2 articulation. No significant stenosis at the craniocervical junction. C2-C3: Right-sided uncovertebral hypertrophy without significant disc bulge. Bilateral facet degeneration with associated ankylosis, greater on the right. No spinal stenosis. Foramina remain patent. C3-C4: Mild disc bulge. Bilateral facet degeneration with associated ankylosis. No spinal stenosis. Foramina remain patent. C4-C5: Degenerative intervertebral disc space narrowing with diffuse disc osteophyte complex. Broad posterior component flattens and partially effaces the ventral thecal sac and resultant mild spinal stenosis. No significant cord deformity. Superimposed right-sided facet degeneration. Resultant moderate right worse than left C5 foraminal stenosis. C5-C6: Degenerative intervertebral disc space narrowing with diffuse disc osteophyte complex. Mild flattening of the ventral thecal sac without significant spinal  stenosis. Moderate left with mild right C6 foraminal narrowing. C6-C7: Diffuse disc osteophyte complex with mild intervertebral disc space narrowing. Associated mild reactive endplate change.  Mild flattening of the ventral thecal sac without significant spinal stenosis. Moderate bilateral C7 foraminal narrowing. C7-T1: Negative interspace. Mild left greater than right facet hypertrophy. No canal or foraminal stenosis. Visualized upper thoracic spine demonstrates mild noncompressive disc bulging and facet degeneration without significant stenosis. IMPRESSION: 1. Moderate multilevel cervical spondylosis with resultant mild spinal stenosis at C4-5. No other significant canal stenosis within the cervical spine. 2. Multifactorial degenerative changes with resultant moderate bilateral C5, C6, and C7 foraminal narrowing as above. 3. Reactive marrow edema about the dens and anterior arch of C1 related to reactive osteoarthritic changes. Additional mild reactive marrow edema about the right C4-5 and left C7-T1 facets due to facet arthritis. Findings could contribute to underlying neck pain. Electronically Signed   By: Rise MuBenjamin  McClintock M.D.   On: 05/13/2020 20:57       Assessment & Plan:   Problem List Items Addressed This Visit    Degenerative cervical disc    MRI as outlined.  Due to see NSU 12/24/20.        Health care maintenance    Physical today 10/30/20.  Mammogram 01/23/20 - [facility] I.  She will schedule.  Overdue colonoscopy.  Discussed. She wants to hold on colonoscopy.  Discussed overdue.  Discussed need for tetanus.       Hypertension - Primary    Blood pressure as outlined.  Slightly increased on my check. On losartan.  Did not tolerate higher dose of losartan previously.  Discussed with her today regarding elevation.  Discussed adjusting medication.  She wants to hold on changing medication.  Wants to get her neck issues addressed first and follow pressures.  Check metabolic panel.        Relevant Orders   CBC with Differential/Platelet   Comprehensive metabolic panel   TSH   Routine general medical examination at a health care facility    Physical today 10/30/20.  Mammogram 01/23/20 - [facility] I.  Ordered f/u mammogram.  She will schedule.  Colonoscopy 04/2012 - one 2mm polyp and redundant colon.  (Dr Marva PandaSkulskie).        Other Visit Diagnoses    Screening cholesterol level       Relevant Orders   Lipid panel   Encounter for screening mammogram for malignant neoplasm of breast       Relevant Orders   MM 3D SCREEN BREAST BILATERAL       Dale Durhamharlene Lexxie Winberg, MD

## 2020-10-30 NOTE — Assessment & Plan Note (Signed)
Blood pressure as outlined.  Slightly increased on my check. On losartan.  Did not tolerate higher dose of losartan previously.  Discussed with her today regarding elevation.  Discussed adjusting medication.  She wants to hold on changing medication.  Wants to get her neck issues addressed first and follow pressures.  Check metabolic panel.

## 2020-10-30 NOTE — Assessment & Plan Note (Addendum)
Physical today 10/30/20.  Mammogram 01/23/20 - [facility] I.  She will schedule.  Overdue colonoscopy.  Discussed. She wants to hold on colonoscopy.  Discussed overdue.  Discussed need for tetanus.

## 2020-11-05 ENCOUNTER — Encounter: Payer: Self-pay | Admitting: Internal Medicine

## 2020-11-05 NOTE — Assessment & Plan Note (Addendum)
Physical today 10/30/20.  Mammogram 01/23/20 - [facility] I.  Ordered f/u mammogram.  She will schedule.  Colonoscopy 04/2012 - one 13mm polyp and redundant colon.  (Dr Marva Panda).

## 2020-11-05 NOTE — Assessment & Plan Note (Signed)
MRI as outlined.  Due to see NSU 12/24/20.

## 2020-12-04 ENCOUNTER — Ambulatory Visit
Admission: RE | Admit: 2020-12-04 | Discharge: 2020-12-04 | Disposition: A | Discharge status: Home / Self Care | Payer: Managed Care, Other (non HMO) | Attending: Nurse Practitioner | Admitting: Nurse Practitioner

## 2020-12-04 ENCOUNTER — Ambulatory Visit
Admission: RE | Admit: 2020-12-04 | Discharge: 2020-12-04 | Disposition: A | Discharge status: Home / Self Care | Payer: Managed Care, Other (non HMO) | Source: Ambulatory Visit | Attending: Nurse Practitioner | Admitting: Nurse Practitioner

## 2020-12-04 ENCOUNTER — Ambulatory Visit: Payer: Managed Care, Other (non HMO) | Admitting: Nurse Practitioner

## 2020-12-04 ENCOUNTER — Other Ambulatory Visit: Payer: Managed Care, Other (non HMO)

## 2020-12-04 VITALS — BP 162/88 | HR 66 | Temp 98.0°F | Resp 16 | Wt 169.0 lb

## 2020-12-04 DIAGNOSIS — S92512A Displaced fracture of proximal phalanx of left lesser toe(s), initial encounter for closed fracture: Secondary | ICD-10-CM | POA: Insufficient documentation

## 2020-12-04 DIAGNOSIS — X58XXXA Exposure to other specified factors, initial encounter: Secondary | ICD-10-CM | POA: Insufficient documentation

## 2020-12-04 DIAGNOSIS — I10 Essential (primary) hypertension: Secondary | ICD-10-CM

## 2020-12-04 DIAGNOSIS — Z13220 Encounter for screening for lipoid disorders: Secondary | ICD-10-CM

## 2020-12-04 DIAGNOSIS — S99922A Unspecified injury of left foot, initial encounter: Secondary | ICD-10-CM

## 2020-12-04 NOTE — Patient Instructions (Signed)
Please Call [facility] to schedule a follow up appointment.

## 2020-12-04 NOTE — Progress Notes (Signed)
   Subjective:    Patient ID: Dawn Vargas, female    DOB: [birth year], 70 y.o.   MRN: [id number]  Ankle Injury   Dawn Vargas is a 70 y.o. female who presents to the [facility] with c/o left foot pain. Onset of pain 11/15/20. Sudden onset as patient was running and hit her left foot on a bed frame. She describes the pain as intermittent dull pain while at rest that she rates 3/10 and sharp pain when walking that may be 8/10. She also reports injury to the same foot a week after the initial injury when she was stepping onto a ledge and her foot bent upward causing the pain to worsen. She has treated the injury at home with ice, elevation, tylenol and topical lidocaine. The home treatment helped a little but the pain has persisted. Patient denies numbness or tingling to the area.    Review of Systems  Musculoskeletal:  Positive for arthralgias.       Left foot pain  All other systems reviewed and are negative.     Objective: BP (!) 162/88 (BP Location: Left Arm, Patient Position: Sitting, Cuff Size: Normal)   Pulse 66   Temp 98 F (36.7 C) (Temporal)   Resp 16   Wt 169 lb (76.7 kg)   LMP 05/21/1998   SpO2 99%   BMI 28.12 kg/m     Physical Exam Vitals and nursing note reviewed.  Constitutional:      Appearance: Normal appearance.  HENT:     Head: Normocephalic.  Cardiovascular:     Rate and Rhythm: Normal rate.  Pulmonary:     Effort: Pulmonary effort is normal.  Musculoskeletal:     Cervical back: Neck supple.     Left foot: Normal capillary refill. Swelling, bunion, tenderness and bony tenderness present. No deformity or laceration. Normal pulse.     Comments: Dorsum and lateral aspect of the left foot tender on palpation. Area of ecchymosis noted to second toe of left foot. Pedal pulses equal bilateral. Decreased range of motion due to pain. Increased pain with dorsiflexion.   Neurological:     Mental Status: She is alert.  Psychiatric:        Mood and Affect: Mood  normal.        Behavior: Behavior normal.     I reviewed the images prior to the dictated report and then confirmed my findings with the Radiologist report.   Assessment & Plan:  1. Injury of left foot including toes, initial encounter - DG Foot Complete Left; Future  2. Closed displaced fracture of proximal phalanx of lesser toe of left foot, initial encounter  DG Foot Complete Left  Result Date: 12/04/2020 CLINICAL DATA:  Recent injury. EXAM: LEFT FOOT - COMPLETE 3+ VIEW COMPARISON:  08/12/2017 FINDINGS: Minimally displaced fracture of the distal aspect of the proximal phalanx of the left fifth digit noted. Fracture extends into the adjacent proximal interphalangeal joint space. No radiopaque foreign body. IMPRESSION: Number displaced fracture of the distal aspect of the proximal phalanx of the left fifth digit. Fracture extends into the adjacent proximal interphalangeal joint space. Electronically Signed   By: Maisie Fus  Register   On: 12/04/2020 09:59   Tylenol for pain Buddy tape and post op shoe Follow up with [facility] as needed.

## 2020-12-05 LAB — CBC WITH DIFFERENTIAL/PLATELET
Basophils Absolute: 0.1 10*3/uL (ref 0.0–0.2)
Basos: 1 %
EOS (ABSOLUTE): 0.1 10*3/uL (ref 0.0–0.4)
Eos: 3 %
Hematocrit: 45 % (ref 34.0–46.6)
Hemoglobin: 14.9 g/dL (ref 11.1–15.9)
Immature Grans (Abs): 0 10*3/uL (ref 0.0–0.1)
Immature Granulocytes: 0 %
Lymphocytes Absolute: 1.5 10*3/uL (ref 0.7–3.1)
Lymphs: 32 %
MCH: 30.2 pg (ref 26.6–33.0)
MCHC: 33.1 g/dL (ref 31.5–35.7)
MCV: 91 fL (ref 79–97)
Monocytes Absolute: 0.4 10*3/uL (ref 0.1–0.9)
Monocytes: 9 %
Neutrophils Absolute: 2.5 10*3/uL (ref 1.4–7.0)
Neutrophils: 55 %
Platelets: 217 10*3/uL (ref 150–450)
RBC: 4.94 x10E6/uL (ref 3.77–5.28)
RDW: 12.7 % (ref 11.7–15.4)
WBC: 4.6 10*3/uL (ref 3.4–10.8)

## 2020-12-05 LAB — LIPID PANEL
Chol/HDL Ratio: 2.1 ratio (ref 0.0–4.4)
Cholesterol, Total: 162 mg/dL (ref 100–199)
HDL: 78 mg/dL (ref >39)
LDL Chol Calc (NIH): 75 mg/dL (ref 0–99)
Triglycerides: 37 mg/dL (ref 0–149)
VLDL Cholesterol Cal: 9 mg/dL (ref 5–40)

## 2020-12-05 LAB — COMPREHENSIVE METABOLIC PANEL
ALT: 14 IU/L (ref 0–32)
AST: 18 IU/L (ref 0–40)
Albumin/Globulin Ratio: 2 (ref 1.2–2.2)
Albumin: 4.4 g/dL (ref 3.7–4.7)
Alkaline Phosphatase: 73 IU/L (ref 44–121)
BUN/Creatinine Ratio: 19 (ref 12–28)
BUN: 14 mg/dL (ref 8–27)
Bilirubin Total: 0.3 mg/dL (ref 0.0–1.2)
CO2: 25 mmol/L (ref 20–29)
Calcium: 9.8 mg/dL (ref 8.7–10.3)
Chloride: 101 mmol/L (ref 96–106)
Creatinine, Ser: 0.72 mg/dL (ref 0.57–1.00)
Globulin, Total: 2.2 g/dL (ref 1.5–4.5)
Glucose: 94 mg/dL (ref 65–99)
Potassium: 4.8 mmol/L (ref 3.5–5.2)
Sodium: 141 mmol/L (ref 134–144)
Total Protein: 6.6 g/dL (ref 6.0–8.5)
eGFR: 89 mL/min/{1.73_m2} (ref >59)

## 2020-12-05 LAB — TSH: TSH: 1.85 u[IU]/mL (ref 0.450–4.500)

## 2020-12-18 ENCOUNTER — Telehealth: Payer: Self-pay | Admitting: Internal Medicine

## 2020-12-18 NOTE — Telephone Encounter (Signed)
Patient dropped off a [employer] form to be completed. Form is up front in color folder.

## 2020-12-19 NOTE — Telephone Encounter (Signed)
Signed and placed in box.  If comes in to pick up form, will need to get waist circumference measured.

## 2020-12-19 NOTE — Telephone Encounter (Signed)
Form has been filled out and placed back in the envelope provided. Charge Form and completed Wellness Screening form have been placed inside the Providers Quick Sign folder for a signature.

## 2020-12-20 NOTE — Telephone Encounter (Signed)
Called and spoke to Dawn Vargas. Xandrea states that she can not come into the office to give a waist circumference. She will have to take her measurements and call us back with the information. She is currently at work and will call back at a later time.

## 2020-12-20 NOTE — Telephone Encounter (Signed)
Form is in the "Dr. Lorin Picket" folder located on my desk in [place].

## 2020-12-21 NOTE — Telephone Encounter (Signed)
Left message to call back  

## 2020-12-24 NOTE — Telephone Encounter (Signed)
PT called to advise that her waist measurements are 34 inches.

## 2020-12-24 NOTE — Telephone Encounter (Signed)
Waist circumference completed and placed in quick sign for your signature

## 2020-12-25 NOTE — Telephone Encounter (Signed)
Signed and placed in box.   

## 2020-12-26 NOTE — Telephone Encounter (Signed)
Mailed to patient per patient request

## 2021-02-05 ENCOUNTER — Other Ambulatory Visit: Payer: Self-pay

## 2021-02-05 ENCOUNTER — Ambulatory Visit
Admission: RE | Admit: 2021-02-05 | Discharge: 2021-02-05 | Disposition: A | Discharge status: Home / Self Care | Payer: Managed Care, Other (non HMO) | Source: Ambulatory Visit | Attending: Internal Medicine | Admitting: Internal Medicine

## 2021-02-05 DIAGNOSIS — Z1231 Encounter for screening mammogram for malignant neoplasm of breast: Secondary | ICD-10-CM | POA: Insufficient documentation

## 2021-02-14 ENCOUNTER — Other Ambulatory Visit: Payer: Self-pay | Admitting: Internal Medicine

## 2021-02-19 ENCOUNTER — Ambulatory Visit: Payer: Managed Care, Other (non HMO) | Admitting: Internal Medicine

## 2021-04-03 ENCOUNTER — Ambulatory Visit: Payer: Managed Care, Other (non HMO) | Admitting: Internal Medicine

## 2021-05-15 ENCOUNTER — Other Ambulatory Visit: Payer: Self-pay | Admitting: Internal Medicine

## 2021-08-14 ENCOUNTER — Other Ambulatory Visit: Payer: Self-pay | Admitting: Internal Medicine

## 2021-08-16 NOTE — Progress Notes (Signed)
?Terrilee Files D.O. ?[facility] ?[address] [place] [address] ?Phone: [phone] ?Subjective:   ?I, Wilford Grist, am serving as a scribe for Dr. Antoine Primas. ? ?This visit occurred during the SARS-CoV-2 public health emergency.  Safety protocols were in place, including screening questions prior to the visit, additional usage of staff PPE, and extensive cleaning of exam room while observing appropriate contact time as indicated for disinfecting solutions.  ? ?I'm seeing this patient by the request  of:  Dale [place], MD ? ?CC: Right knee pain ? ?[id number]  ?Dawn Vargas is a 71 y.o. female coming in with complaint of R SI joint and R knee pain. Last seen in 2021 for cervical spine pain. Patient states that her back is doing much better. Has been seeing chiropractor. Patient does have radiating pain from R hip down to the knee.  ? ?Patient has been having R knee pain since January. Pain in posterior aspect. Pressing on gas pedal bothers her. Last week she twisted her knee and felt pain over medial aspect.   ? ? ?  ? ?Past Medical History:  ?Diagnosis Date  ? Allergic asthma   ? Allergic rhinitis   ? Arthritis   ? Chronic back pain   ? s/p surgery for herniated disc 93/89)  ? Chronic back pain   ? Complication of anesthesia   ? Frequent UTI   ? GERD (gastroesophageal reflux disease)   ? pt. states no symptoms  ? Heart murmur   ? history of  ? History of bronchitis   ? History of kidney stones   ? History of shingles   ? Hypertension   ? Hypertension   ? PONV (postoperative nausea and vomiting)   ? pt. states history of nausea in 1989  ? Reactive airway disease   ? Rotator cuff tear   ? left shoulder- pt. states healing  ? ?Past Surgical History:  ?Procedure Laterality Date  ? BACK SURGERY    ? CHOLECYSTECTOMY  August 25 2012  ? COLONOSCOPY  2013  ? ESOPHAGOGASTRODUODENOSCOPY  01/2011  ? EYE SURGERY    ? laporoscopy  08/22/1997  ? VAGINAL HYSTERECTOMY  1999  ? laparoscopic assisted   ? ?Social History  ? ?Socioeconomic History  ? Marital status: Single  ?  Spouse name: Not on file  ? Number of children: Not on file  ? Years of education: Not on file  ? Highest education level: Not on file  ?Occupational History  ? Not on file  ?Tobacco Use  ? Smoking status: Never  ? Smokeless tobacco: Never  ?Substance and Sexual Activity  ? Alcohol use: Yes  ?  Alcohol/week: 0.0 standard drinks  ?  Comment: socially  ? Drug use: No  ? Sexual activity: Not on file  ?Other Topics Concern  ? Not on file  ?Social History Narrative  ? Not on file  ? ?Social Determinants of Health  ? ?Financial Resource Strain: Not on file  ?Food Insecurity: Not on file  ?Transportation Needs: Not on file  ?Physical Activity: Not on file  ?Stress: Not on file  ?Social Connections: Not on file  ? ?Allergies  ?Allergen Reactions  ? Other Other (See Comments)  ?  Smoke - severe reactive airway disease   ? Benadryl [Diphenhydramine Hcl] Hives  ? Oxycodone Other (See Comments)  ?  dizzy  ? Beef-Derived Products Other (See Comments)  ?  Gi upset  ? Caladryl [Pramoxine-Calamine] Hives  ? Clarithromycin Other (  See Comments)  ?  Gi upset  ? Cleocin [Clindamycin Hcl] Other (See Comments)  ?  GI upset ?  ? Eggs Or Egg-Derived Products Diarrhea  ? Hydrochlorothiazide Other (See Comments)  ?  dizzy  ? Pegademase Bovine Nausea And Vomiting  ?  Gi upset  ? Soybean-Containing Drug Products Diarrhea  ? Tomato Other (See Comments)  ?  Small amount is okay, large amount causes diarrhea - Also potatoes  ? ?Family History  ?Problem Relation Age of Onset  ? Dementia Father   ? Prostate cancer Father   ? Heart disease Mother   ?     myocardial infarction, congestive heart failure  ? Hypertension Mother   ? Heart disease Brother   ?     angioplasty  ? Hypertension Brother   ? Hypertension Sister   ? Breast cancer Cousin   ?     maternal side-50's  ? Colon cancer Neg Hx   ? ? ? ?Current Outpatient Medications (Cardiovascular):  ?  losartan (COZAAR) 25 MG  tablet, Take 1 tablet by mouth twice daily ? ? ?Current Outpatient Medications (Analgesics):  ?  acetaminophen (TYLENOL) 650 MG CR tablet, Take 1,300 mg by mouth at bedtime. ? ? ?Current Outpatient Medications (Other):  ?  B Complex-C (SUPER B COMPLEX PO), Take 1 tablet by mouth daily. ?  CRANBERRY PO, Take 1 capsule by mouth daily. ?  Glucosamine-Chondroitin-MSM-D (TRIPLE FLEX/VITAMIN D3) TABS, Take 1 tablet by mouth daily. ?  Omega-3 Fatty Acids (FISH OIL PO), Take 1 capsule by mouth daily. ?  Probiotic Product (PROBIOTIC PEARLS PO), Take 1 capsule by mouth daily. ?  Turmeric 500 MG CAPS, Take 500 mg by mouth daily. ? ? ?Reviewed prior external information including notes and imaging from  ?primary care provider ?As well as notes that were available from care everywhere and other healthcare systems. ? ?Past medical history, social, surgical and family history all reviewed in electronic medical record.  No pertanent information unless stated regarding to the chief complaint.  ? ?Review of Systems: ? No headache, visual changes, nausea, vomiting, diarrhea, constipation, dizziness, abdominal pain, skin rash, fevers, chills, night sweats, weight loss, swollen lymph nodes, body aches, chest pain, shortness of breath, mood changes. POSITIVE muscle aches, joint swelling ? ?Objective  ?Blood pressure (!) 140/100, pulse 86, height 5\' 5"  (1.651 m), weight 165 lb (74.8 kg), last menstrual period 05/21/1998, SpO2 99 %. ?  ?General: No apparent distress alert and oriented x3 mood and affect normal, dressed appropriately.  ?HEENT: Pupils equal, extraocular movements intact  ?Respiratory: Patient's speak in full sentences and does not appear short of breath  ?Cardiovascular: No lower extremity edema, non tender, no erythema  ?Gait severely antalgic.  Right knee does have a large lipoma noted on the medial aspect.  Patient is tender to palpation in the area.  Patient does have laxity noted of the LCL compared to the contralateral  side.  Patient is does have an effusion of the patellofemoral joint.  Lacks last 10 degrees of flexion. ? ? ?Limited muscular skeletal ultrasound was performed and interpreted by 05/23/1998, M  ?Limited ultrasound of the right knee shows the patient does have a large effusion noted of the patellofemoral joint.  Moderate narrowing of the patellofemoral joint moderate narrowing of the medial joint space.  Lateral joint seems to have some hypoechoic changes around the LCL but no significant gapping on dynamic testing. 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 ?After informed written  and verbal consent, patient was seated on exam table. Right knee was prepped with alcohol swab and utilizing anterolateral approach, patient's right knee space was injected with 4:1  marcaine 0.5%: Kenalog 40mg /dL. Patient tolerated the procedure well without immediate complications. ?  ?Impression and Recommendations:  ?  ? ?The above documentation has been reviewed and is accurate and complete Judi SaaZachary M Ambra Haverstick, DO ? ? ? ?

## 2021-08-19 ENCOUNTER — Ambulatory Visit: Payer: Managed Care, Other (non HMO) | Admitting: Family Medicine

## 2021-08-19 ENCOUNTER — Other Ambulatory Visit: Payer: Self-pay

## 2021-08-19 ENCOUNTER — Ambulatory Visit (INDEPENDENT_AMBULATORY_CARE_PROVIDER_SITE_OTHER): Payer: Managed Care, Other (non HMO)

## 2021-08-19 ENCOUNTER — Ambulatory Visit: Payer: Self-pay

## 2021-08-19 ENCOUNTER — Encounter: Payer: Self-pay | Admitting: Family Medicine

## 2021-08-19 VITALS — BP 140/100 | HR 86 | Ht 65.0 in | Wt 165.0 lb

## 2021-08-19 DIAGNOSIS — M1711 Unilateral primary osteoarthritis, right knee: Secondary | ICD-10-CM | POA: Insufficient documentation

## 2021-08-19 DIAGNOSIS — M25461 Effusion, right knee: Secondary | ICD-10-CM

## 2021-08-19 DIAGNOSIS — M545 Low back pain, unspecified: Secondary | ICD-10-CM

## 2021-08-19 DIAGNOSIS — M25561 Pain in right knee: Secondary | ICD-10-CM

## 2021-08-19 NOTE — Assessment & Plan Note (Signed)
Patient does have degenerative changes but also seems to have more of an LCL tear.  Patient does have a significant effusion noted as well.  May need to consider the possibility of aspiration.  I do believe the patient likely did have a Baker's cyst that is not present now that probably gave her the leg pain previously.  We will get x-rays to further evaluate.  Could be a candidate for viscosupplementation.  Discussed the importance of monitoring weight.  Follow-up again in 6 to 8 weeks ?

## 2021-08-19 NOTE — Patient Instructions (Addendum)
Xray today ?Wear brace daily ?Ice ?No cutting motions ?Exercises ?See me in 6-8 weeks ?

## 2021-08-22 ENCOUNTER — Ambulatory Visit: Payer: Managed Care, Other (non HMO) | Admitting: Internal Medicine

## 2021-08-22 ENCOUNTER — Other Ambulatory Visit: Payer: Self-pay

## 2021-08-22 ENCOUNTER — Encounter: Payer: Self-pay | Admitting: Internal Medicine

## 2021-08-22 DIAGNOSIS — M25561 Pain in right knee: Secondary | ICD-10-CM | POA: Insufficient documentation

## 2021-08-22 DIAGNOSIS — I10 Essential (primary) hypertension: Secondary | ICD-10-CM

## 2021-08-22 DIAGNOSIS — M542 Cervicalgia: Secondary | ICD-10-CM | POA: Diagnosis not present

## 2021-08-22 DIAGNOSIS — Z8371 Family history of colonic polyps: Secondary | ICD-10-CM | POA: Diagnosis not present

## 2021-08-22 NOTE — Assessment & Plan Note (Signed)
Had colonoscopy 04/2012.  Pt had reported due f/u in 10 years.  GI had recommended 5 year f/u.  Have previously discussed colonoscopy and again discussed today.  She is retiring in 10/2021.  Wants to hold on having colonoscopy until after she retires.  Will call and notify me when agreeable to schedule.   ?

## 2021-08-22 NOTE — Assessment & Plan Note (Signed)
Previously saw [facility].  Better now.  No neck pain.  Follow.  ?

## 2021-08-22 NOTE — Assessment & Plan Note (Signed)
S/p withdrawal of fluid and injection.  Seeing Dr Katrinka Blazing.  Doing better.   ?

## 2021-08-22 NOTE — Progress Notes (Signed)
Patient ID: Dawn Vargas, female   DOB: [birth year], 71 y.o.   MRN: [id number] ? ? ?Subjective:  ? ? Patient ID: Dawn Vargas, female    DOB: [birth year], 71 y.o.   MRN: [id number] ? ?This visit occurred during the SARS-CoV-2 public health emergency.  Safety protocols were in place, including screening questions prior to the visit, additional usage of staff PPE, and extensive cleaning of exam room while observing appropriate contact time as indicated for disinfecting solutions.  ? ?Patient here for a scheduled follow up.  ? ?HPI ?Here to follow up regarding her blood pressure. Just saw Dr Katrinka Blazing for back pain and right knee pain.  [place] - large effusion - patellofemoral joint.  S/p injection.  Previous neck pain.  Saw [facility]. Neck pain better. Back is doing well.  Knee is better s/p aspiration and injection.  Planning to retire 10/09/21.  No chest pain or sob reported.  No abdominal pain or bowel change reported.  Overall feels good.  Planning to get back in the gym once she retires.  States blood pressures are averaging 125-129/78-80.  Discussed colonoscopy.  ? ? ?Past Medical History:  ?Diagnosis Date  ? Allergic asthma   ? Allergic rhinitis   ? Arthritis   ? Chronic back pain   ? s/p surgery for herniated disc 93/89)  ? Chronic back pain   ? Complication of anesthesia   ? Frequent UTI   ? GERD (gastroesophageal reflux disease)   ? pt. states no symptoms  ? Heart murmur   ? history of  ? History of bronchitis   ? History of kidney stones   ? History of shingles   ? Hypertension   ? Hypertension   ? PONV (postoperative nausea and vomiting)   ? pt. states history of nausea in 1989  ? Reactive airway disease   ? Rotator cuff tear   ? left shoulder- pt. states healing  ? ?Past Surgical History:  ?Procedure Laterality Date  ? BACK SURGERY    ? CHOLECYSTECTOMY  August 25 2012  ? COLONOSCOPY  2013  ? ESOPHAGOGASTRODUODENOSCOPY  01/2011  ? EYE SURGERY    ? laporoscopy  08/22/1997  ? VAGINAL HYSTERECTOMY  1999  ? laparoscopic assisted   ? ?Family History  ?Problem Relation Age of Onset  ? Dementia Father   ? Prostate cancer Father   ? Heart disease Mother   ?     myocardial infarction, congestive heart failure  ? Hypertension Mother   ? Heart disease Brother   ?     angioplasty  ? Hypertension Brother   ? Hypertension Sister   ? Breast cancer Cousin   ?     maternal side-50's  ? Colon cancer Neg Hx   ? ?Social History  ? ?Socioeconomic History  ? Marital status: Single  ?  Spouse name: Not on file  ? Number of children: Not on file  ? Years of education: Not on file  ? Highest education level: Not on file  ?Occupational History  ? Not on file  ?Tobacco Use  ? Smoking status: Never  ? Smokeless tobacco: Never  ?Substance and Sexual Activity  ? Alcohol use: Yes  ?  Alcohol/week: 0.0 standard drinks  ?  Comment: socially  ? Drug use: No  ? Sexual activity: Not on file  ?Other Topics Concern  ? Not on file  ?Social History Narrative  ? Not on file  ? ?Social Determinants of Health  ? ?Physicist, medical  Strain: Not on file  ?Food Insecurity: Not on file  ?Transportation Needs: Not on file  ?Physical Activity: Not on file  ?Stress: Not on file  ?Social Connections: Not on file  ? ? ? ?Review of Systems  ?Constitutional:  Negative for appetite change and unexpected weight change.  ?HENT:  Negative for congestion, sinus pressure and sore throat.   ?Eyes:  Negative for pain and visual disturbance.  ?Respiratory:  Negative for cough, chest tightness and shortness of breath.   ?Cardiovascular:  Negative for chest pain, palpitations and leg swelling.  ?Gastrointestinal:  Negative for abdominal pain, diarrhea, nausea and vomiting.  ?Genitourinary:  Negative for difficulty urinating and dysuria.  ?Musculoskeletal:  Positive for joint swelling. Negative for back pain.  ?     Knee pain and swelling as outlined.  (Better)  ?Skin:  Negative for color change and rash.  ?Neurological:  Negative for dizziness, light-headedness and headaches.  ?Hematological:   Negative for adenopathy. Does not bruise/bleed easily.  ?Psychiatric/Behavioral:  Negative for agitation and dysphoric mood.   ? ?   ?Objective:  ?  ? ?BP 122/78   Pulse 71   Temp 97.9 ?F (36.6 ?C)   Resp 16   Ht 5' 5.5" (1.664 m)   Wt 174 lb (78.9 kg)   LMP 05/21/1998   SpO2 97%   BMI 28.51 kg/m?  ?Wt Readings from Last 3 Encounters:  ?08/22/21 174 lb (78.9 kg)  ?08/19/21 165 lb (74.8 kg)  ?12/04/20 169 lb (76.7 kg)  ? ? ?Physical Exam ?Vitals reviewed.  ?Constitutional:   ?   General: She is not in acute distress. ?   Appearance: Normal appearance.  ?HENT:  ?   Head: Normocephalic and atraumatic.  ?   Right Ear: External ear normal.  ?   Left Ear: External ear normal.  ?Eyes:  ?   General: No scleral icterus.    ?   Right eye: No discharge.     ?   Left eye: No discharge.  ?   Conjunctiva/sclera: Conjunctivae normal.  ?Neck:  ?   Thyroid: No thyromegaly.  ?Cardiovascular:  ?   Rate and Rhythm: Normal rate and regular rhythm.  ?Pulmonary:  ?   Effort: No respiratory distress.  ?   Breath sounds: Normal breath sounds. No wheezing.  ?Abdominal:  ?   General: Bowel sounds are normal.  ?   Palpations: Abdomen is soft.  ?   Tenderness: There is no abdominal tenderness.  ?Musculoskeletal:     ?   General: No tenderness.  ?   Cervical back: Neck supple. No tenderness.  ?   Comments: No increased erythema - right knee.  Increased rom.  (Decreased swelling).   ?Lymphadenopathy:  ?   Cervical: No cervical adenopathy.  ?Skin: ?   Findings: No erythema or rash.  ?Neurological:  ?   Mental Status: She is alert.  ?Psychiatric:     ?   Mood and Affect: Mood normal.     ?   Behavior: Behavior normal.  ? ? ? ?Outpatient Encounter Medications as of 08/22/2021  ?Medication Sig  ? acetaminophen (TYLENOL) 650 MG CR tablet Take 1,300 mg by mouth at bedtime.  ? B Complex-C (SUPER B COMPLEX PO) Take 1 tablet by mouth daily.  ? CRANBERRY PO Take 1 capsule by mouth daily.  ? Glucosamine-Chondroitin-MSM-D (TRIPLE FLEX/VITAMIN D3) TABS  Take 1 tablet by mouth daily.  ? losartan (COZAAR) 25 MG tablet Take 1 tablet by mouth twice daily  ?  Omega-3 Fatty Acids (FISH OIL PO) Take 1 capsule by mouth daily.  ? Probiotic Product (PROBIOTIC PEARLS PO) Take 1 capsule by mouth daily.  ? Turmeric 500 MG CAPS Take 500 mg by mouth daily.  ? ?No facility-administered encounter medications on file as of 08/22/2021.  ?  ? ?Lab Results  ?Component Value Date  ? WBC 4.6 12/04/2020  ? HGB 14.9 12/04/2020  ? HCT 45.0 12/04/2020  ? PLT 217 12/04/2020  ? GLUCOSE 94 12/04/2020  ? CHOL 162 12/04/2020  ? TRIG 37 12/04/2020  ? HDL 78 12/04/2020  ? LDLCALC 75 12/04/2020  ? ALT 14 12/04/2020  ? AST 18 12/04/2020  ? NA 141 12/04/2020  ? K 4.8 12/04/2020  ? CL 101 12/04/2020  ? CREATININE 0.72 12/04/2020  ? BUN 14 12/04/2020  ? CO2 25 12/04/2020  ? TSH 1.850 12/04/2020  ? INR 0.98 02/22/2015  ? HGBA1C 5.5 11/10/2016  ? ? ?MM 3D SCREEN BREAST BILATERAL ? ?Result Date: 02/09/2021 ?CLINICAL DATA:  Screening. EXAM: DIGITAL SCREENING BILATERAL MAMMOGRAM WITH TOMOSYNTHESIS AND CAD TECHNIQUE: Bilateral screening digital craniocaudal and mediolateral oblique mammograms were obtained. Bilateral screening digital breast tomosynthesis was performed. The images were evaluated with computer-aided detection. COMPARISON:  Previous exam(s). ACR Breast Density Category b: There are scattered areas of fibroglandular density. FINDINGS: There are no findings suspicious for malignancy. IMPRESSION: No mammographic evidence of malignancy. A result letter of this screening mammogram will be mailed directly to the patient. RECOMMENDATION: Screening mammogram in one year. (Code:SM-B-01Y) BI-RADS CATEGORY  1: Negative. Electronically Signed   By: Sherian Rein M.D.   On: 02/09/2021 21:19 ? ? ?   ?Assessment & Plan:  ? ?Problem List Items Addressed This Visit   ? ? Family history of colonic polyps  ?  Had colonoscopy 04/2012.  Pt had reported due f/u in 10 years.  GI had recommended 5 year f/u.  Have  previously discussed colonoscopy and again discussed today.  She is retiring in 10/2021.  Wants to hold on having colonoscopy until after she retires.  Will call and notify me when agreeable to schedule.   ?  ?  ? Hypertensi

## 2021-08-22 NOTE — Assessment & Plan Note (Signed)
Blood pressure doing better.  On losartan.  Did not tolerate higher dose.  Continue current medication regimen.  Follow pressures.  Follow metabolic panel.  ?

## 2021-08-26 ENCOUNTER — Encounter: Payer: Self-pay | Admitting: Internal Medicine

## 2021-08-26 LAB — LIPID PANEL
Cholesterol: 153 (ref 0–200)
HDL: 75 — AB (ref 35–70)
LDL Cholesterol: 67
Triglycerides: 50 (ref 40–160)

## 2021-08-26 LAB — HEPATIC FUNCTION PANEL
ALT: 14 U/L (ref 7–35)
AST: 14 (ref 13–35)
Alkaline Phosphatase: 75 (ref 25–125)
Bilirubin, Total: 0.3

## 2021-08-26 LAB — BASIC METABOLIC PANEL
BUN: 13 (ref 4–21)
CO2: 24 — AB (ref 13–22)
Chloride: 100 (ref 99–108)
Creatinine: 0.6 (ref 0.5–1.1)
Glucose: 90
Potassium: 4 mEq/L (ref 3.5–5.1)
Sodium: 137 (ref 137–147)

## 2021-08-26 LAB — COMPREHENSIVE METABOLIC PANEL
Albumin: 4.2 (ref 3.5–5.0)
Calcium: 9.2 (ref 8.7–10.7)
Globulin: 2.3

## 2021-08-26 LAB — TSH: TSH: 1.62 (ref 0.41–5.90)

## 2021-08-26 LAB — CBC AND DIFFERENTIAL
HCT: 43 (ref 36–46)
Hemoglobin: 14.3 (ref 12.0–16.0)
Neutrophils Absolute: 3.4
Platelets: 219 10*3/uL (ref 150–400)
WBC: 5.3

## 2021-08-26 LAB — CBC: RBC: 4.73 (ref 3.87–5.11)

## 2021-09-24 ENCOUNTER — Telehealth: Payer: Self-pay | Admitting: Internal Medicine

## 2021-09-24 NOTE — Telephone Encounter (Signed)
Pt called about recent lab results done on 08/26/21. Pt request results  ?

## 2021-09-25 NOTE — Telephone Encounter (Signed)
Lm for pt to cb.

## 2021-09-26 ENCOUNTER — Other Ambulatory Visit: Payer: Self-pay | Admitting: Internal Medicine

## 2021-09-27 NOTE — Progress Notes (Signed)
?Terrilee Files D.O. ?[facility] ?[address] [place] [address] ?Phone: [phone] ?Subjective:   ?I, Wilford Grist, am serving as a scribe for Dr. Antoine Primas. ? ?This visit occurred during the SARS-CoV-2 public health emergency.  Safety protocols were in place, including screening questions prior to the visit, additional usage of staff PPE, and extensive cleaning of exam room while observing appropriate contact time as indicated for disinfecting solutions.  ? ?I'm seeing this patient by the request  of:  Dale [place], MD ? ?CC: Knee pain and low back pain ? ?[id number]  ?08/19/2021 ?Patient does have degenerative changes but also seems to have more of an LCL tear.  Patient does have a significant effusion noted as well.  May need to consider the possibility of aspiration.  I do believe the patient likely did have a Baker's cyst that is not present now that probably gave her the leg pain previously.  We will get x-rays to further evaluate.  Could be a candidate for viscosupplementation.  Discussed the importance of monitoring weight.  Follow-up again in 6 to 8 weeks ? ?Update 09/30/2021 ?Dawn Vargas is a 71 y.o. female coming in with complaint of lumbar spine and R knee pain.  Patient was last seen and did have what appeared to be more of an LCL tear.  Significant effusion noted.  Patient states that she is doing better. Pain in lateral and posterior aspect of knee. Pain is no longer radiating nor sharp. Is now able to walk up and down stairs using reciprocal gait. Wearing brace to work for stability.  ? ?Back pain has improved and is sleeping better.  ? ?X-rays of the right knee did show the patient did have a mild patella alto with patellar femoral degenerative changes. ? ?X-rays of the lumbar spine show moderate to severe L4-L5 degenerative disc disease noted. ? ?  ? ?Past Medical History:  ?Diagnosis Date  ? Allergic asthma   ? Allergic rhinitis   ? Arthritis   ? Chronic back pain    ? s/p surgery for herniated disc 93/89)  ? Chronic back pain   ? Complication of anesthesia   ? Frequent UTI   ? GERD (gastroesophageal reflux disease)   ? pt. states no symptoms  ? Heart murmur   ? history of  ? History of bronchitis   ? History of kidney stones   ? History of shingles   ? Hypertension   ? Hypertension   ? PONV (postoperative nausea and vomiting)   ? pt. states history of nausea in 1989  ? Reactive airway disease   ? Rotator cuff tear   ? left shoulder- pt. states healing  ? ?Past Surgical History:  ?Procedure Laterality Date  ? BACK SURGERY    ? CHOLECYSTECTOMY  August 25 2012  ? COLONOSCOPY  2013  ? ESOPHAGOGASTRODUODENOSCOPY  01/2011  ? EYE SURGERY    ? laporoscopy  08/22/1997  ? VAGINAL HYSTERECTOMY  1999  ? laparoscopic assisted  ? ?Social History  ? ?Socioeconomic History  ? Marital status: Single  ?  Spouse name: Not on file  ? Number of children: Not on file  ? Years of education: Not on file  ? Highest education level: Not on file  ?Occupational History  ? Not on file  ?Tobacco Use  ? Smoking status: Never  ? Smokeless tobacco: Never  ?Substance and Sexual Activity  ? Alcohol use: Yes  ?  Alcohol/week: 0.0 standard drinks  ?  Comment: socially  ? Drug use: No  ? Sexual activity: Not on file  ?Other Topics Concern  ? Not on file  ?Social History Narrative  ? Not on file  ? ?Social Determinants of Health  ? ?Financial Resource Strain: Not on file  ?Food Insecurity: Not on file  ?Transportation Needs: Not on file  ?Physical Activity: Not on file  ?Stress: Not on file  ?Social Connections: Not on file  ? ?Allergies  ?Allergen Reactions  ? Other Other (See Comments)  ?  Smoke - severe reactive airway disease   ? Benadryl [Diphenhydramine Hcl] Hives  ? Oxycodone Other (See Comments)  ?  dizzy  ? Beef-Derived Products Other (See Comments)  ?  Gi upset  ? Caladryl [Pramoxine-Calamine] Hives  ? Clarithromycin Other (See Comments)  ?  Gi upset  ? Cleocin [Clindamycin Hcl] Other (See Comments)  ?  GI  upset ?  ? Eggs Or Egg-Derived Products Diarrhea  ? Hydrochlorothiazide Other (See Comments)  ?  dizzy  ? Pegademase Bovine Nausea And Vomiting  ?  Gi upset  ? Soybean-Containing Drug Products Diarrhea  ? Tomato Other (See Comments)  ?  Small amount is okay, large amount causes diarrhea - Also potatoes  ? ?Family History  ?Problem Relation Age of Onset  ? Dementia Father   ? Prostate cancer Father   ? Heart disease Mother   ?     myocardial infarction, congestive heart failure  ? Hypertension Mother   ? Heart disease Brother   ?     angioplasty  ? Hypertension Brother   ? Hypertension Sister   ? Breast cancer Cousin   ?     maternal side-50's  ? Colon cancer Neg Hx   ? ? ? ?Current Outpatient Medications (Cardiovascular):  ?  losartan (COZAAR) 25 MG tablet, Take 1 tablet by mouth twice daily ? ? ?Current Outpatient Medications (Analgesics):  ?  acetaminophen (TYLENOL) 650 MG CR tablet, Take 1,300 mg by mouth at bedtime. ? ? ?Current Outpatient Medications (Other):  ?  B Complex-C (SUPER B COMPLEX PO), Take 1 tablet by mouth daily. ?  CRANBERRY PO, Take 1 capsule by mouth daily. ?  Glucosamine-Chondroitin-MSM-D (TRIPLE FLEX/VITAMIN D3) TABS, Take 1 tablet by mouth daily. ?  Omega-3 Fatty Acids (FISH OIL PO), Take 1 capsule by mouth daily. ?  Probiotic Product (PROBIOTIC PEARLS PO), Take 1 capsule by mouth daily. ?  Turmeric 500 MG CAPS, Take 500 mg by mouth daily. ? ?t.  ? ?Review of Systems: ? No headache, visual changes, nausea, vomiting, diarrhea, constipation, dizziness, abdominal pain, skin rash, fevers, chills, night sweats, weight loss, swollen lymph nodes, body aches, joint swelling, chest pain, shortness of breath, mood changes. POSITIVE muscle aches but very mild ? ?Objective  ?Blood pressure (!) 134/102, pulse 65, height 5' 5.5" (1.664 m), weight 176 lb (79.8 kg), last menstrual period 05/21/1998, SpO2 98 %. ?  ?General: No apparent distress alert and oriented x3 mood and affect normal, dressed  appropriately.  ?HEENT: Pupils equal, extraocular movements intact  ?Respiratory: Patient's speak in full sentences and does not appear short of breath  ?Cardiovascular: No lower extremity edema, non tender, no erythema  ?Gait antalgic  ?MSK:  right knee instability and laxity of the LCL noted.  Trace effusion noted of the patellofemoral joint.  Moderate tender though otherwise. ?Back exam mild loss of lordosis. ?  ?Impression and Recommendations:  ?  ?The above documentation has been reviewed and is accurate and complete  Judi Saa, DO ? ? ? ?

## 2021-09-30 ENCOUNTER — Encounter: Payer: Self-pay | Admitting: Family Medicine

## 2021-09-30 ENCOUNTER — Ambulatory Visit (INDEPENDENT_AMBULATORY_CARE_PROVIDER_SITE_OTHER): Payer: Managed Care, Other (non HMO) | Admitting: Family Medicine

## 2021-09-30 DIAGNOSIS — M1711 Unilateral primary osteoarthritis, right knee: Secondary | ICD-10-CM

## 2021-09-30 DIAGNOSIS — M5416 Radiculopathy, lumbar region: Secondary | ICD-10-CM

## 2021-09-30 NOTE — Patient Instructions (Signed)
Congrats on your retirement! ? ?

## 2021-09-30 NOTE — Assessment & Plan Note (Signed)
Stable as well at this time.  Do not want him to make any significant or increasing pain at the moment.  Patient will continue to be monitoring.  With patient ? ?Retiring this week hopefully patient will have more time for herself and doing well. ?

## 2021-09-30 NOTE — Assessment & Plan Note (Signed)
Arthritis, doing well, hold on other injections.  ?Encourage patient to continue to wear the brace when needed.  Patient wants to hold on any other type of treatment options at the moment.  Follow-up with me again in 2 to 3 months ?

## 2021-10-14 ENCOUNTER — Ambulatory Visit: Payer: Managed Care, Other (non HMO) | Admitting: Family Medicine

## 2021-11-20 DIAGNOSIS — H4311 Vitreous hemorrhage, right eye: Secondary | ICD-10-CM | POA: Diagnosis not present

## 2021-11-20 DIAGNOSIS — H33312 Horseshoe tear of retina without detachment, left eye: Secondary | ICD-10-CM | POA: Diagnosis not present

## 2021-11-20 DIAGNOSIS — H43812 Vitreous degeneration, left eye: Secondary | ICD-10-CM | POA: Diagnosis not present

## 2021-11-20 DIAGNOSIS — H43813 Vitreous degeneration, bilateral: Secondary | ICD-10-CM | POA: Diagnosis not present

## 2021-11-20 DIAGNOSIS — H2513 Age-related nuclear cataract, bilateral: Secondary | ICD-10-CM | POA: Diagnosis not present

## 2021-12-03 NOTE — Progress Notes (Signed)
[name] [address] [place] [address] Phone: [phone] Subjective:   [name], am serving as a scribe for Dr. Antoine Primas.  I'm seeing this patient by the request  of:  Dale [place], MD  CC: right knee pain   [id number]  09/30/2021 Arthritis, doing well, hold on other injections.  Encourage patient to continue to wear the brace when needed.  Patient wants to hold on any other type of treatment options at the moment.  Follow-up with me again in 2 to 3 months  Update 12/04/2021 Dawn Vargas is a 71 y.o. female coming in with complaint of R knee and LBP. Patient states that she is doing better. Wearing brace a lot as she was in [place] helping her sister and brother in Social worker. Was able to drive back without pain.          Past Medical History:  Diagnosis Date   Allergic asthma    Allergic rhinitis    Arthritis    Chronic back pain    s/p surgery for herniated disc 93/89)   Chronic back pain    Complication of anesthesia    Frequent UTI    GERD (gastroesophageal reflux disease)    pt. states no symptoms   Heart murmur    history of   History of bronchitis    History of kidney stones    History of shingles    Hypertension    Hypertension    PONV (postoperative nausea and vomiting)    pt. states history of nausea in 1989   Reactive airway disease    Rotator cuff tear    left shoulder- pt. states healing   Past Surgical History:  Procedure Laterality Date   BACK SURGERY     CHOLECYSTECTOMY  August 25 2012   COLONOSCOPY  2013   ESOPHAGOGASTRODUODENOSCOPY  01/2011   EYE SURGERY     laporoscopy  08/22/1997   VAGINAL HYSTERECTOMY  1999   laparoscopic assisted   Social History   Socioeconomic History   Marital status: Single    Spouse name: Not on file   Number of children: Not on file   Years of education: Not on file   Highest education level: Not on file  Occupational History   Not on file  Tobacco Use    Smoking status: Never   Smokeless tobacco: Never  Substance and Sexual Activity   Alcohol use: Yes    Alcohol/week: 0.0 standard drinks of alcohol    Comment: socially   Drug use: No   Sexual activity: Not on file  Other Topics Concern   Not on file  Social History Narrative   Not on file   Social Determinants of Health   Financial Resource Strain: Not on file  Food Insecurity: Not on file  Transportation Needs: Not on file  Physical Activity: Not on file  Stress: Not on file  Social Connections: Not on file   Allergies  Allergen Reactions   Other Other (See Comments)    Smoke - severe reactive airway disease    Benadryl [Diphenhydramine Hcl] Hives   Oxycodone Other (See Comments)    dizzy   Beef-Derived Products Other (See Comments)    Gi upset   Caladryl [Pramoxine-Calamine] Hives   Clarithromycin Other (See Comments)    Gi upset   Cleocin [Clindamycin Hcl] Other (See Comments)    GI upset    Eggs Or Egg-Derived Products Diarrhea   Hydrochlorothiazide Other (  See Comments)    dizzy   Pegademase Bovine Nausea And Vomiting    Gi upset   Soybean-Containing Drug Products Diarrhea   Tomato Other (See Comments)    Small amount is okay, large amount causes diarrhea - Also potatoes   Family History  Problem Relation Age of Onset   Dementia Father    Prostate cancer Father    Heart disease Mother        myocardial infarction, congestive heart failure   Hypertension Mother    Heart disease Brother        angioplasty   Hypertension Brother    Hypertension Sister    Breast cancer Cousin        maternal side-50's   Colon cancer Neg Hx      Current Outpatient Medications (Cardiovascular):    losartan (COZAAR) 25 MG tablet, Take 1 tablet by mouth twice daily   Current Outpatient Medications (Analgesics):    acetaminophen (TYLENOL) 650 MG CR tablet, Take 1,300 mg by mouth at bedtime.   Current Outpatient Medications (Other):    B Complex-C (SUPER B COMPLEX  PO), Take 1 tablet by mouth daily.   CRANBERRY PO, Take 1 capsule by mouth daily.   Glucosamine-Chondroitin-MSM-D (TRIPLE FLEX/VITAMIN D3) TABS, Take 1 tablet by mouth daily.   Omega-3 Fatty Acids (FISH OIL PO), Take 1 capsule by mouth daily.   Probiotic Product (PROBIOTIC PEARLS PO), Take 1 capsule by mouth daily.   Turmeric 500 MG CAPS, Take 500 mg by mouth daily.    Objective  Blood pressure (!) 142/88, pulse 70, height 5' 5.5" (1.664 m), weight 176 lb (79.8 kg), last menstrual period 05/21/1998, SpO2 98 %.   General: No apparent distress alert and oriented x3 mood and affect normal, dressed appropriately.  HEENT: Pupils equal, extraocular movements intact  Respiratory: Patient's speak in full sentences and does not appear short of breath  Cardiovascular: No lower extremity edema, non tender, no erythema  Antalgic gait noted.  Does have arthritic changes of the knees right greater than left.  No significant effusion noted at the moment.  Back exam does have some loss of lordosis but no significant tenderness at the moment.  5 out of 5 strength of the lower extremities    Impression and Recommendations:    The above documentation has been reviewed and is accurate and complete Judi Saa, DO

## 2021-12-04 ENCOUNTER — Ambulatory Visit: Payer: Medicare Other | Admitting: Family Medicine

## 2021-12-04 DIAGNOSIS — M1711 Unilateral primary osteoarthritis, right knee: Secondary | ICD-10-CM | POA: Diagnosis not present

## 2021-12-04 NOTE — Assessment & Plan Note (Signed)
Patient is doing very well at this time.  Discussed the possibility of another injection but at the moment he would like to hold.  Patient will continue to be active otherwise.  Discussed the possibility of the patient having more pain here than she does in [place] secondary to possible allergies.  Patient will follow-up again in 2 to 3 months

## 2021-12-04 NOTE — Patient Instructions (Signed)
Good to see you Get back into routine See me in 2 months

## 2021-12-19 DIAGNOSIS — H2513 Age-related nuclear cataract, bilateral: Secondary | ICD-10-CM | POA: Diagnosis not present

## 2021-12-19 DIAGNOSIS — H35371 Puckering of macula, right eye: Secondary | ICD-10-CM | POA: Diagnosis not present

## 2021-12-27 ENCOUNTER — Other Ambulatory Visit: Payer: Self-pay | Admitting: Internal Medicine

## 2022-01-07 ENCOUNTER — Emergency Department: Payer: Medicare Other

## 2022-01-07 ENCOUNTER — Encounter: Payer: Self-pay | Admitting: Emergency Medicine

## 2022-01-07 ENCOUNTER — Emergency Department
Admission: EM | Admit: 2022-01-07 | Discharge: 2022-01-07 | Disposition: A | Discharge status: Home / Self Care | Payer: Medicare Other | Attending: Emergency Medicine | Admitting: Emergency Medicine

## 2022-01-07 DIAGNOSIS — W010XXA Fall on same level from slipping, tripping and stumbling without subsequent striking against object, initial encounter: Secondary | ICD-10-CM | POA: Insufficient documentation

## 2022-01-07 DIAGNOSIS — M25531 Pain in right wrist: Secondary | ICD-10-CM | POA: Diagnosis not present

## 2022-01-07 DIAGNOSIS — Y92137 Garden or yard on military base as the place of occurrence of the external cause: Secondary | ICD-10-CM | POA: Insufficient documentation

## 2022-01-07 DIAGNOSIS — S61412A Laceration without foreign body of left hand, initial encounter: Secondary | ICD-10-CM

## 2022-01-07 DIAGNOSIS — S63501A Unspecified sprain of right wrist, initial encounter: Secondary | ICD-10-CM | POA: Diagnosis not present

## 2022-01-07 DIAGNOSIS — I10 Essential (primary) hypertension: Secondary | ICD-10-CM | POA: Diagnosis not present

## 2022-01-07 DIAGNOSIS — S0990XA Unspecified injury of head, initial encounter: Secondary | ICD-10-CM

## 2022-01-07 DIAGNOSIS — J45909 Unspecified asthma, uncomplicated: Secondary | ICD-10-CM | POA: Diagnosis not present

## 2022-01-07 DIAGNOSIS — Y9301 Activity, walking, marching and hiking: Secondary | ICD-10-CM | POA: Diagnosis not present

## 2022-01-07 DIAGNOSIS — S0003XA Contusion of scalp, initial encounter: Secondary | ICD-10-CM | POA: Diagnosis not present

## 2022-01-07 DIAGNOSIS — S63502A Unspecified sprain of left wrist, initial encounter: Secondary | ICD-10-CM | POA: Diagnosis not present

## 2022-01-07 NOTE — ED Provider Notes (Signed)
[facility] Provider Note    Event Date/Time   First MD Initiated Contact with Patient 01/07/22 2101     (approximate)   History   Fall   HPI  Dawn Vargas is a 71 y.o. female presents to the emergency department for treatment and evaluation after mechanical, nonsyncopal fall.  Patient states that she was walking out of her garden, tripped, and fell forward catching herself with her right wrist.  She did strike her forehead on concrete.  No loss of consciousness.  She does have a contusion over the right eyebrow.  No neck pain, nausea, or vomiting. No change in vision.  Past Medical History:  Diagnosis Date   Allergic asthma    Allergic rhinitis    Arthritis    Chronic back pain    s/p surgery for herniated disc 93/89)   Chronic back pain    Complication of anesthesia    Frequent UTI    GERD (gastroesophageal reflux disease)    pt. states no symptoms   Heart murmur    history of   History of bronchitis    History of kidney stones    History of shingles    Hypertension    Hypertension    PONV (postoperative nausea and vomiting)    pt. states history of nausea in 1989   Reactive airway disease    Rotator cuff tear    left shoulder- pt. states healing     Physical Exam   Triage Vital Signs: ED Triage Vitals  Enc Vitals Group     BP 01/07/22 1940 (!) 214/113     Pulse Rate 01/07/22 1940 73     Resp 01/07/22 1940 20     Temp 01/07/22 1940 97.8 F (36.6 C)     Temp Source 01/07/22 1940 Oral     SpO2 01/07/22 1940 100 %     Weight 01/07/22 1938 180 lb 12.4 oz (82 kg)     Height 01/07/22 1938 5' 5.5" (1.664 m)     Head Circumference --      Peak Flow --      Pain Score 01/07/22 1938 8     Pain Loc --      Pain Edu? --      Excl. in GC? --     Most recent vital signs: Vitals:   01/07/22 1940 01/07/22 2156  BP: (!) 214/113 (!) 168/88  Pulse: 73 70  Resp: 20 17  Temp: 97.8 F (36.6 C)   SpO2: 100% 100%    General: Awake,  no distress.  CV:  Good peripheral perfusion.  Resp:  Normal effort.  Abd:  No distention.  Other:  Pupils equal round and reactive to light.  3 mm and brisk.  Focal hematoma over the right eyebrow.  Right wrist is tender with flexion but without edema or deformity.  Full range of motion of the fingers of the right hand.  1 cm superficial laceration to the left palm without any active bleeding.   ED Results / Procedures / Treatments   Labs (all labs ordered are listed, but only abnormal results are displayed) Labs Reviewed - No data to display   EKG     RADIOLOGY  CT head negative for acute concerns.  Image of the right wrist negative for acute bony abnormality.  I have independently reviewed and interpreted imaging as well as reviewed report from radiology.  PROCEDURES:  Critical Care performed: No  Procedures  MEDICATIONS ORDERED IN ED:  Medications - No data to display   IMPRESSION / MDM / ASSESSMENT AND PLAN / ED COURSE   I reviewed the triage vital signs and the nursing notes.  Differential diagnosis includes, but is not limited to: Concussion, contusion, ICH, wrist fracture  Patient's presentation is most consistent with acute presentation with potential threat to life or bodily function.  71 year old female presenting to the emergency department after sustaining a mechanical, nonsyncopal fall.  See HPI for further details.  CT head was performed while awaiting ER room assignment and is negative for acute concerns.  Imaging of the right wrist also shows no fracture.  Exam is reassuring.  She does have a hematoma over the right eyebrow but no active bleeding or open wounds.  Patient continues to deny pain in her neck, back, or lower extremities.  Plan will be to discharge her home with head injury instructions.  Patient declined a wrist splint or medication for pain.  She is noted to be hypertensive but states that she is very nervous and has not taken her  blood pressure medications since she has been here this evening.  She denies any chest pain or associated symptoms.  Patient advised to take Tylenol for pain and her medications for blood pressure as soon as she gets home.  She has agreed to do so.  ER return precautions discussed.     FINAL CLINICAL IMPRESSION(S) / ED DIAGNOSES   Final diagnoses:  Minor head injury, initial encounter  Wrist sprain, right, initial encounter  Superficial laceration of left hand, initial encounter     Rx / DC Orders   ED Discharge Orders     None        Note:  This document was prepared using Dragon voice recognition software and may include unintentional dictation errors.   Chinita Pester, FNP 01/08/22 Salley Hews    Chesley Noon, MD 01/08/22 [phone]

## 2022-01-07 NOTE — Discharge Instructions (Signed)
Please take Tylenol for pain.  Take your blood pressure medications as soon as you get home.  If you develop any concerning symptoms such as chest pain, blurred vision, shortness of breath, or dizziness please return to the emergency department.

## 2022-01-07 NOTE — ED Triage Notes (Signed)
Pt presents via POV with complaints of fall and headache. Pt has a hematoma to the right side of her forehead. Pt states she tripped and landed on concrete - no LOC, not on thinners, no vision changes, no neck tenderness. Pt also endorses pain in the right wrist due to how she landed & has a small abrasion to the medial aspect of her left hand. Not UTD on tetanus shot. Hx of HTN - hasn't taken her medication tonight. Denies N/V.

## 2022-01-22 ENCOUNTER — Other Ambulatory Visit: Payer: Self-pay | Admitting: Internal Medicine

## 2022-02-04 ENCOUNTER — Telehealth: Payer: Self-pay | Admitting: Internal Medicine

## 2022-02-04 NOTE — Telephone Encounter (Signed)
Spoke with patient she was in [place], req CB in 02/2022

## 2022-02-24 ENCOUNTER — Encounter: Payer: Self-pay | Admitting: Internal Medicine

## 2022-02-24 ENCOUNTER — Ambulatory Visit (INDEPENDENT_AMBULATORY_CARE_PROVIDER_SITE_OTHER): Payer: Medicare Other | Admitting: Internal Medicine

## 2022-02-24 VITALS — BP 132/80 | HR 76 | Temp 97.6°F | Ht 65.0 in | Wt 174.4 lb

## 2022-02-24 DIAGNOSIS — Z0000 Encounter for general adult medical examination without abnormal findings: Secondary | ICD-10-CM

## 2022-02-24 DIAGNOSIS — Z1231 Encounter for screening mammogram for malignant neoplasm of breast: Secondary | ICD-10-CM

## 2022-02-24 DIAGNOSIS — Z8371 Family history of colonic polyps: Secondary | ICD-10-CM

## 2022-02-24 DIAGNOSIS — I10 Essential (primary) hypertension: Secondary | ICD-10-CM | POA: Diagnosis not present

## 2022-02-24 DIAGNOSIS — Z83719 Family history of colon polyps, unspecified: Secondary | ICD-10-CM

## 2022-02-24 DIAGNOSIS — Z1211 Encounter for screening for malignant neoplasm of colon: Secondary | ICD-10-CM

## 2022-02-24 LAB — BASIC METABOLIC PANEL
BUN: 14 mg/dL (ref 6–23)
CO2: 28 mEq/L (ref 19–32)
Calcium: 9.7 mg/dL (ref 8.4–10.5)
Chloride: 101 mEq/L (ref 96–112)
Creatinine, Ser: 0.71 mg/dL (ref 0.40–1.20)
GFR: 84.88 mL/min (ref >60.00)
Glucose, Bld: 94 mg/dL (ref 70–99)
Potassium: 4 mEq/L (ref 3.5–5.1)
Sodium: 138 mEq/L (ref 135–145)

## 2022-02-24 LAB — HEPATIC FUNCTION PANEL
ALT: 13 U/L (ref 0–35)
AST: 15 U/L (ref 0–37)
Albumin: 4.3 g/dL (ref 3.5–5.2)
Alkaline Phosphatase: 73 U/L (ref 39–117)
Bilirubin, Direct: 0.1 mg/dL (ref 0.0–0.3)
Total Bilirubin: 0.5 mg/dL (ref 0.2–1.2)
Total Protein: 6.9 g/dL (ref 6.0–8.3)

## 2022-02-24 LAB — LIPID PANEL
Cholesterol: 159 mg/dL (ref 0–200)
HDL: 76.5 mg/dL (ref >39.00)
LDL Cholesterol: 72 mg/dL (ref 0–99)
NonHDL: 82.34
Total CHOL/HDL Ratio: 2
Triglycerides: 50 mg/dL (ref 0.0–149.0)
VLDL: 10 mg/dL (ref 0.0–40.0)

## 2022-02-24 MED ORDER — LOSARTAN POTASSIUM 25 MG PO TABS: 25.0000 mg | ORAL_TABLET | Freq: Two times a day (BID) | ORAL | 0 refills | Status: DC

## 2022-02-24 NOTE — Progress Notes (Signed)
Patient ID: Dawn Vargas, female   DOB: 1951-03-07, 71 y.o.   MRN: [id number]   Subjective:    Patient ID: Dawn Vargas, female    DOB: 1950/10/03, 71 y.o.   MRN: [id number]   Patient here for  Chief Complaint  Patient presents with   Follow-up    Physical   .   HPI Here for her physical exam. Recent ER evaluation - fall.  Tripped and fell forward.  CT head - ok.  Wrist xray - no fracture. Previously saw Dr Katrinka Blazing - right knee pain.  S/p injection.  Retired 10/2021.  Overall she feels she is doing relatively well.  No chest pain or sob reported.  No abdominal pain.  Bowels moving.  States blood pressures averaging 121-128/70s.  Discussed colonoscopy.     Past Medical History:  Diagnosis Date   Allergic asthma    Allergic rhinitis    Arthritis    Chronic back pain    s/p surgery for herniated disc 93/89)   Chronic back pain    Complication of anesthesia    Frequent UTI    GERD (gastroesophageal reflux disease)    pt. states no symptoms   Heart murmur    history of   History of bronchitis    History of kidney stones    History of shingles    Hypertension    Hypertension    PONV (postoperative nausea and vomiting)    pt. states history of nausea in 1989   Reactive airway disease    Rotator cuff tear    left shoulder- pt. states healing   Past Surgical History:  Procedure Laterality Date   BACK SURGERY     CHOLECYSTECTOMY  August 25 2012   COLONOSCOPY  2013   ESOPHAGOGASTRODUODENOSCOPY  01/2011   EYE SURGERY     laporoscopy  08/22/1997   VAGINAL HYSTERECTOMY  1999   laparoscopic assisted   Family History  Problem Relation Age of Onset   Dementia Father    Prostate cancer Father    Heart disease Mother        myocardial infarction, congestive heart failure   Hypertension Mother    Heart disease Brother        angioplasty   Hypertension Brother    Hypertension Sister    Breast cancer Cousin        maternal side-50's   Colon cancer Neg Hx    Social History    Socioeconomic History   Marital status: Single    Spouse name: Not on file   Number of children: Not on file   Years of education: Not on file   Highest education level: Not on file  Occupational History   Not on file  Tobacco Use   Smoking status: Never   Smokeless tobacco: Never  Substance and Sexual Activity   Alcohol use: Yes    Alcohol/week: 0.0 standard drinks of alcohol    Comment: socially   Drug use: No   Sexual activity: Not on file  Other Topics Concern   Not on file  Social History Narrative   Not on file   Social Determinants of Health   Financial Resource Strain: Not on file  Food Insecurity: Not on file  Transportation Needs: Not on file  Physical Activity: Not on file  Stress: Not on file  Social Connections: Not on file     Review of Systems  Constitutional:  Negative for appetite change and unexpected weight change.  HENT:  Negative for congestion, sinus pressure and sore throat.   Eyes:  Negative for pain and visual disturbance.  Respiratory:  Negative for cough, chest tightness and shortness of breath.   Cardiovascular:  Negative for chest pain, palpitations and leg swelling.  Gastrointestinal:  Negative for abdominal pain, diarrhea, nausea and vomiting.  Genitourinary:  Negative for difficulty urinating and dysuria.  Musculoskeletal:  Negative for joint swelling and myalgias.  Skin:  Negative for color change and rash.  Neurological:  Negative for dizziness, light-headedness and headaches.  Hematological:  Negative for adenopathy. Does not bruise/bleed easily.  Psychiatric/Behavioral:  Negative for agitation and dysphoric mood.        Objective:     BP 132/80 (BP Location: Left Arm, Patient Position: Sitting, Cuff Size: Normal)   Pulse 76   Temp 97.6 F (36.4 C) (Oral)   Ht 5\' 5"  (1.651 m)   Wt 174 lb 6.4 oz (79.1 kg)   LMP 05/21/1998   SpO2 97%   BMI 29.02 kg/m  Wt Readings from Last 3 Encounters:  02/24/22 174 lb 6.4 oz (79.1  kg)  01/07/22 180 lb 12.4 oz (82 kg)  12/04/21 176 lb (79.8 kg)    Physical Exam Vitals reviewed.  Constitutional:      General: She is not in acute distress.    Appearance: Normal appearance. She is well-developed.  HENT:     Head: Normocephalic and atraumatic.     Right Ear: External ear normal.     Left Ear: External ear normal.  Eyes:     General: No scleral icterus.       Right eye: No discharge.        Left eye: No discharge.     Conjunctiva/sclera: Conjunctivae normal.  Neck:     Thyroid: No thyromegaly.  Cardiovascular:     Rate and Rhythm: Normal rate and regular rhythm.  Pulmonary:     Effort: No tachypnea, accessory muscle usage or respiratory distress.     Breath sounds: Normal breath sounds. No decreased breath sounds or wheezing.  Chest:  Breasts:    Right: No inverted nipple, mass, nipple discharge or tenderness (no axillary adenopathy).     Left: No inverted nipple, mass, nipple discharge or tenderness (no axilarry adenopathy).  Abdominal:     General: Bowel sounds are normal.     Palpations: Abdomen is soft.     Tenderness: There is no abdominal tenderness.  Musculoskeletal:        General: No swelling or tenderness.     Cervical back: Neck supple.  Lymphadenopathy:     Cervical: No cervical adenopathy.  Skin:    Findings: No erythema or rash.  Neurological:     Mental Status: She is alert and oriented to person, place, and time.  Psychiatric:        Mood and Affect: Mood normal.        Behavior: Behavior normal.      Outpatient Encounter Medications as of 02/24/2022  Medication Sig   acetaminophen (TYLENOL) 650 MG CR tablet Take 1,300 mg by mouth at bedtime.   B Complex-C (SUPER B COMPLEX PO) Take 1 tablet by mouth daily.   CRANBERRY PO Take 1 capsule by mouth daily.   Glucosamine-Chondroitin-MSM-D (TRIPLE FLEX/VITAMIN D3) TABS Take 1 tablet by mouth daily.   Omega-3 Fatty Acids (FISH OIL PO) Take 1 capsule by mouth daily.   Probiotic Product  (PROBIOTIC PEARLS PO) Take 1 capsule by mouth daily.   Turmeric 500 MG CAPS  Take 500 mg by mouth daily.   [DISCONTINUED] losartan (COZAAR) 25 MG tablet Take 1 tablet by mouth twice daily   losartan (COZAAR) 25 MG tablet Take 1 tablet (25 mg total) by mouth 2 (two) times daily.   No facility-administered encounter medications on file as of 02/24/2022.     Lab Results  Component Value Date   WBC 5.3 08/26/2021   HGB 14.3 08/26/2021   HCT 43 08/26/2021   PLT 219 08/26/2021   GLUCOSE 94 02/24/2022   CHOL 159 02/24/2022   TRIG 50.0 02/24/2022   HDL 76.50 02/24/2022   LDLCALC 72 02/24/2022   ALT 13 02/24/2022   AST 15 02/24/2022   NA 138 02/24/2022   K 4.0 02/24/2022   CL 101 02/24/2022   CREATININE 0.71 02/24/2022   BUN 14 02/24/2022   CO2 28 02/24/2022   TSH 1.62 08/26/2021   INR 0.98 02/22/2015   HGBA1C 5.5 11/10/2016    DG Wrist Complete Right  Result Date: 01/07/2022 CLINICAL DATA:  Recent fall with wrist pain, initial encounter EXAM: RIGHT WRIST - COMPLETE 3+ VIEW COMPARISON:  None Available. FINDINGS: There is no evidence of fracture or dislocation. There is no evidence of arthropathy or other focal bone abnormality. Soft tissues are unremarkable. IMPRESSION: No acute abnormality noted. Electronically Signed   By: Alcide Clever M.D.   On: 01/07/2022 20:07   CT HEAD WO CONTRAST ( )  Result Date: 01/07/2022 CLINICAL DATA:  Head trauma EXAM: CT HEAD WITHOUT CONTRAST TECHNIQUE: Contiguous axial images were obtained from the base of the skull through the vertex without intravenous contrast. RADIATION DOSE REDUCTION: This exam was performed according to the departmental dose-optimization program which includes automated exposure control, adjustment of the mA and/or kV according to patient size and/or use of iterative reconstruction technique. COMPARISON:  None Available. FINDINGS: Brain: No acute territorial infarction, hemorrhage, or intracranial mass. Mild atrophy. Mildly prominent  ventricles likely secondary to atrophy. Vascular: No hyperdense vessels.  Carotid vascular calcification Skull: Normal. Negative for fracture or focal lesion. Sinuses/Orbits: No acute finding. Other: Moderate right forehead scalp hematoma IMPRESSION: 1. No CT evidence for acute intracranial abnormality. 2. Mild atrophy Electronically Signed   By: Jasmine Pang M.D.   On: 01/07/2022 19:53       Assessment & Plan:   Problem List Items Addressed This Visit     Family history of colonic polyps    Had colonoscopy 04/2012.  Pt had reported due f/u in 10 years.  GI had recommended 5 year f/u.  Have previously discussed colonoscopy.   She retired 10/2021.  Wanted to hold on having colonoscopy until after she retired.   Will refer to GI for evaluation - colonoscopy.       Relevant Orders   Ambulatory referral to Gastroenterology   Health care maintenance    Physical today 02/24/22.  Mammogram 02/09/21 - Birads I.  Overdue colonoscopy.  Refer to GI.       Hypertension    Blood pressure as outlined. On losartan.  Did not tolerate higher dose.  Continue current medication regimen.  Follow pressures.  Follow metabolic panel.       Relevant Medications   losartan (COZAAR) 25 MG tablet   Other Relevant Orders   Lipid panel (Completed)   Hepatic function panel (Completed)   Basic metabolic panel (Completed)   Routine general medical examination at a health care facility - Primary   Other Visit Diagnoses     Encounter for screening mammogram for  malignant neoplasm of breast       Relevant Orders   MM 3D SCREEN BREAST BILATERAL   Colon cancer screening       Relevant Orders   Ambulatory referral to Gastroenterology        Dale Durhamharlene Mizraim Harmening, MD

## 2022-02-24 NOTE — Assessment & Plan Note (Addendum)
Physical today 02/24/22.  Mammogram 02/09/21 - Birads I.  Overdue colonoscopy.  Refer to GI.

## 2022-02-25 ENCOUNTER — Telehealth: Payer: Self-pay

## 2022-02-25 NOTE — Telephone Encounter (Signed)
Pt called back and I read the message to her and she stated she does not want cholesterol medication

## 2022-02-25 NOTE — Telephone Encounter (Signed)
Lvm for pt to return call in regards to lab results.   Per Dr.Scott: I reviewed your recent lab results and your cholesterol levels look good.  Given your calculated cholesterol risk, it is still recommended for you to be on a cholesterol medication - for prevention.  If agreeable can start low dose crestor - 5mg  q Monday, Wednesday and Friday.  Will need liver panel checked 6 weeks after starting the medication.  Kidney function tests and liver function tests are wnl.

## 2022-03-02 ENCOUNTER — Encounter: Payer: Self-pay | Admitting: Internal Medicine

## 2022-03-02 NOTE — Assessment & Plan Note (Signed)
Had colonoscopy 04/2012.  Pt had reported due f/u in 10 years.  GI had recommended 5 year f/u.  Have previously discussed colonoscopy.   She retired 10/2021.  Wanted to hold on having colonoscopy until after she retired.   Will refer to GI for evaluation - colonoscopy.

## 2022-03-02 NOTE — Assessment & Plan Note (Signed)
Blood pressure as outlined. On losartan.  Did not tolerate higher dose.  Continue current medication regimen.  Follow pressures.  Follow metabolic panel.

## 2022-03-04 ENCOUNTER — Ambulatory Visit
Admission: RE | Admit: 2022-03-04 | Discharge: 2022-03-04 | Disposition: A | Discharge status: Home / Self Care | Payer: Medicare Other | Source: Ambulatory Visit | Attending: Internal Medicine | Admitting: Internal Medicine

## 2022-03-04 DIAGNOSIS — Z1231 Encounter for screening mammogram for malignant neoplasm of breast: Secondary | ICD-10-CM | POA: Insufficient documentation

## 2022-04-14 ENCOUNTER — Other Ambulatory Visit: Payer: Self-pay | Admitting: Internal Medicine

## 2022-04-14 ENCOUNTER — Emergency Department
Admission: EM | Admit: 2022-04-14 | Discharge: 2022-04-14 | Disposition: A | Discharge status: Home / Self Care | Payer: Medicare Other | Attending: Emergency Medicine | Admitting: Emergency Medicine

## 2022-04-14 ENCOUNTER — Emergency Department: Payer: Medicare Other

## 2022-04-14 ENCOUNTER — Other Ambulatory Visit: Payer: Self-pay

## 2022-04-14 DIAGNOSIS — R3911 Hesitancy of micturition: Secondary | ICD-10-CM | POA: Insufficient documentation

## 2022-04-14 DIAGNOSIS — R109 Unspecified abdominal pain: Secondary | ICD-10-CM | POA: Insufficient documentation

## 2022-04-14 DIAGNOSIS — N3289 Other specified disorders of bladder: Secondary | ICD-10-CM | POA: Diagnosis not present

## 2022-04-14 DIAGNOSIS — R34 Anuria and oliguria: Secondary | ICD-10-CM | POA: Diagnosis not present

## 2022-04-14 DIAGNOSIS — Z79899 Other long term (current) drug therapy: Secondary | ICD-10-CM | POA: Insufficient documentation

## 2022-04-14 DIAGNOSIS — Z87442 Personal history of urinary calculi: Secondary | ICD-10-CM | POA: Diagnosis not present

## 2022-04-14 DIAGNOSIS — M545 Low back pain, unspecified: Secondary | ICD-10-CM | POA: Insufficient documentation

## 2022-04-14 DIAGNOSIS — Q438 Other specified congenital malformations of intestine: Secondary | ICD-10-CM | POA: Diagnosis not present

## 2022-04-14 DIAGNOSIS — R309 Painful micturition, unspecified: Secondary | ICD-10-CM | POA: Diagnosis not present

## 2022-04-14 DIAGNOSIS — I10 Essential (primary) hypertension: Secondary | ICD-10-CM | POA: Insufficient documentation

## 2022-04-14 DIAGNOSIS — M5459 Other low back pain: Secondary | ICD-10-CM | POA: Diagnosis not present

## 2022-04-14 LAB — BASIC METABOLIC PANEL
Anion gap: 9 (ref 5–15)
BUN: 9 mg/dL (ref 8–23)
CO2: 27 mmol/L (ref 22–32)
Calcium: 9.4 mg/dL (ref 8.9–10.3)
Chloride: 95 mmol/L — ABNORMAL LOW (ref 98–111)
Creatinine, Ser: 0.57 mg/dL (ref 0.44–1.00)
GFR, Estimated: >60 mL/min (ref >60)
Glucose, Bld: 92 mg/dL (ref 70–99)
Potassium: 3.6 mmol/L (ref 3.5–5.1)
Sodium: 131 mmol/L — ABNORMAL LOW (ref 135–145)

## 2022-04-14 LAB — CBC
HCT: 43 % (ref 36.0–46.0)
Hemoglobin: 14.6 g/dL (ref 12.0–15.0)
MCH: 29.7 pg (ref 26.0–34.0)
MCHC: 34 g/dL (ref 30.0–36.0)
MCV: 87.6 fL (ref 80.0–100.0)
Platelets: 220 10*3/uL (ref 150–400)
RBC: 4.91 MIL/uL (ref 3.87–5.11)
RDW: 12.4 % (ref 11.5–15.5)
WBC: 8 10*3/uL (ref 4.0–10.5)
nRBC: 0 % (ref 0.0–0.2)

## 2022-04-14 LAB — HEPATIC FUNCTION PANEL
ALT: 18 U/L (ref 0–44)
AST: 22 U/L (ref 15–41)
Albumin: 4.7 g/dL (ref 3.5–5.0)
Alkaline Phosphatase: 67 U/L (ref 38–126)
Bilirubin, Direct: 0.1 mg/dL (ref 0.0–0.2)
Indirect Bilirubin: 0.8 mg/dL (ref 0.3–0.9)
Total Bilirubin: 0.9 mg/dL (ref 0.3–1.2)
Total Protein: 7.3 g/dL (ref 6.5–8.1)

## 2022-04-14 LAB — URINALYSIS, ROUTINE W REFLEX MICROSCOPIC
Bilirubin Urine: NEGATIVE
Glucose, UA: NEGATIVE mg/dL
Hgb urine dipstick: NEGATIVE
Ketones, ur: 5 mg/dL — AB
Leukocytes,Ua: NEGATIVE
Nitrite: NEGATIVE
Protein, ur: NEGATIVE mg/dL
Specific Gravity, Urine: 1.004 — ABNORMAL LOW (ref 1.005–1.030)
pH: 6 (ref 5.0–8.0)

## 2022-04-14 LAB — LIPASE, BLOOD: Lipase: 29 U/L (ref 11–51)

## 2022-04-14 MED ORDER — PREDNISONE 20 MG PO TABS: 40.0000 mg | ORAL_TABLET | Freq: Every day | ORAL | 0 refills | Status: AC

## 2022-04-14 MED ORDER — CYCLOBENZAPRINE HCL 10 MG PO TABS: 10.0000 mg | ORAL_TABLET | Freq: Three times a day (TID) | ORAL | 0 refills | Status: DC | PRN

## 2022-04-14 NOTE — ED Notes (Signed)
Dr. Cherylann Banas aware of BP. Patient did not take BP medication today and states she also has a history of white coat syndrome.

## 2022-04-14 NOTE — ED Notes (Signed)
Patient declines need for pain medication.

## 2022-04-14 NOTE — ED Provider Notes (Signed)
[facility] Provider Note    Event Date/Time   First MD Initiated Contact with Patient 04/14/22 2112     (approximate)   History   Back Pain and Hypertension   HPI  Dawn Vargas is a 71 y.o. female with a history of chronic back pain, UTI, kidney stones, and hypertension on losartan who presents with bilateral low back pain for about the last 4 days, gradual onset, persistent course, not related to any specific positions or movements.  It is slightly worse on the right.  The patient also had some urinary hesitancy and decreased urine output.  She started drinking a lot of water a few days ago and then started having more frequent urination.  She denies any hematuria.  She has no abdominal pain, nausea or vomiting, or fever.  I reviewed the past medical records.  The patient's most recent outpatient encounter was with her primary care provider on 02/24/2022 for follow-up and health maintenance.  She also had an ED visit in early August for a fall.   Physical Exam   Triage Vital Signs: ED Triage Vitals  Enc Vitals Group     BP 04/14/22 1832 (!) 213/98     Pulse Rate 04/14/22 1831 76     Resp 04/14/22 1831 18     Temp 04/14/22 1831 97.8 F (36.6 C)     Temp Source 04/14/22 1831 Oral     SpO2 04/14/22 1831 99 %     Weight --      Height --      Head Circumference --      Peak Flow --      Pain Score 04/14/22 1833 10     Pain Loc --      Pain Edu? --      Excl. in GC? --     Most recent vital signs: Vitals:   04/14/22 2228 04/14/22 2243  BP: (!) 208/91 (!) 214/94  Pulse: 66 65  Resp: 20 20  Temp: 97.9 F (36.6 C) 98 F (36.7 C)  SpO2: 100% 100%     General: Awake, no distress.  CV:  Good peripheral perfusion.  Resp:  Normal effort.  Abd:  No distention.  Other:  No midline spinal tenderness.  Normal gait.  Normal strength in bilateral lower extremities.  No significant CVA tenderness.  Mild bilateral lumbar paraspinal muscle  tenderness.   ED Results / Procedures / Treatments   Labs (all labs ordered are listed, but only abnormal results are displayed) Labs Reviewed  URINALYSIS, ROUTINE W REFLEX MICROSCOPIC - Abnormal; Notable for the following components:      Result Value   Color, Urine STRAW (*)    APPearance CLEAR (*)    Specific Gravity, Urine 1.004 (*)    Ketones, ur 5 (*)    All other components within normal limits  BASIC METABOLIC PANEL - Abnormal; Notable for the following components:   Sodium 131 (*)    Chloride 95 (*)    All other components within normal limits  CBC  HEPATIC FUNCTION PANEL  LIPASE, BLOOD     EKG  ED ECG REPORT I, Dionne Bucy, the attending physician, personally viewed and interpreted this ECG.  Date: 04/14/2022 EKG Time: 1836 Rate: 73 Rhythm: normal sinus rhythm QRS Axis: normal Intervals: normal ST/T Wave abnormalities: normal Narrative Interpretation: no evidence of acute ischemia   RADIOLOGY  CT abdomen/pelvis: I independently viewed and interpreted the images; there is no ureteral stone  or hydronephrosis.  Radiology report indicates no acute abnormalities.  PROCEDURES:  Critical Care performed: No  Procedures   MEDICATIONS ORDERED IN ED: Medications - No data to display   IMPRESSION / MDM / [place] / ED COURSE  I reviewed the triage vital signs and the nursing notes.  71 year old female with PMH as noted above presents with bilateral low back pain over the last several days with some urinary symptoms that seem to be resolving.  She has been taking ibuprofen at home with some relief.  Exam is unremarkable with very mild bilateral paraspinal muscle tenderness.  The patient is significantly hypertensive but states that she did not take her losartan today and has had "white coat hypertension" to this level previously.  She denies any chest pain, headache, or shortness of breath.  Differential diagnosis includes, but is not limited  to, ureteral stone, UTI, musculoskeletal pain, sciatica.  The patient is overall comfortable and has not required any pain medication in the ED.  There is no clinical evidence for renal infarct or other vascular etiology.  We will obtain lab work-up, urinalysis, and a CT renal stone study.  Patient's presentation is most consistent with acute presentation with potential threat to life or bodily function.  ----------------------------------------- 11:00 PM on 04/14/2022 -----------------------------------------   Urinalysis shows no acute findings to suggest infection.  There is no leukocytosis.  Creatinine is normal.  Renal stone study is negative for acute findings.  Overall presentation is consistent with musculoskeletal or other benign etiology of the back pain.  The patient will continue ibuprofen and I have prescribed prednisone and Flexeril.  The patient's blood pressure remains elevated, however she is asymptomatic at this time and there is no evidence of hypertensive urgency or any and organ dysfunction.  The patient states that she has had similar blood pressure before when undergoing medical evaluations.  She declines any treatment for it at this time and wants to go home.  I counseled her on the results of the work-up and the plan of care.  I instructed her to follow-up with her PMD.  I gave strict return precautions and she expressed understanding.  FINAL CLINICAL IMPRESSION(S) / ED DIAGNOSES   Final diagnoses:  Acute bilateral low back pain without sciatica     Rx / DC Orders   ED Discharge Orders          Ordered    predniSONE (DELTASONE) 20 MG tablet  Daily with breakfast        04/14/22 2225    cyclobenzaprine (FLEXERIL) 10 MG tablet  3 times daily PRN        04/14/22 2225             Note:  This document was prepared using Dragon voice recognition software and may include unintentional dictation errors.    Arta Silence, MD 04/14/22 [phone]

## 2022-04-14 NOTE — ED Notes (Signed)
Patient transported to CT 

## 2022-04-14 NOTE — Discharge Instructions (Signed)
Continue taking ibuprofen 400 mg every 6 hours as needed for pain.  Take the prednisone daily for 4 days as prescribed.  This will decrease inflammation.  You may also take the Flexeril if needed.  This is a muscle relaxant.  Do not drive or operate machinery while taking this medication.  Follow-up with your primary care doctor within 1 week.  Return to the ER for new, worsening, or persistent severe back pain, abdominal pain, fever, pain on urination, blood in the urine, vomiting, weakness or numbness, difficulty walking, or any other new or worsening symptoms that concern you.

## 2022-04-14 NOTE — ED Triage Notes (Signed)
Pt to ED via POV from [facility]. Pt states bilateral lower back pain started on Thursday.  Pt also reports pain with urination. Pt reports hx kidney stones and UTIs.  Pt hypertensive in triage and states did take BP meds this morning.

## 2022-04-18 ENCOUNTER — Telehealth: Payer: Self-pay

## 2022-04-18 NOTE — Telephone Encounter (Signed)
S/w patient - advised if still having symptoms needs to be evaluated. Pt refuses to go back to [facility] , stated they did nothing for her and neither did the ER doctor. Patient states they told her they felt like it could be a muscle and they said theres nothing for them to do. Patient was given prednisone and muscle relaxer for 5 days and told if symptoms dont go away by the end of the week to call her PCP.  Patient stated she has hx of kidney stones - CT ABD/PELV neg for stones Hx of UTI - patient states urine clear at ER - no pain when urinating. No burning, no pressure, no fever.  Pt sched for Thursday at 4pm. Advised if symptoms worsen at all or new symptoms appear over weekend, needs to be evaluated.  I recommended [facility] across the street, and also the [facility].

## 2022-04-18 NOTE — Telephone Encounter (Signed)
Agree with reevaluation if persistent increased symptoms.

## 2022-04-18 NOTE — Telephone Encounter (Signed)
Patient states she was seen in the ED on 04/14/2022 for pain in her back around her right kidney area.  Patient states she is still experiencing symptoms after her visit to include urge to urinate, but nothing comes out or a dribble, discomfort in lower pelvic area on right side, patient passed a glob of mucus on Wednesday.  Patient states she has no bleeding.  Patient states this pain started on 04/10/2022.  Patient states her pain comes down a notch if she takes advil.  Patient states she is not sure what to do, please call.  Patient states her blood pressure was high in hospital, but she doesn't want Dr. Dale Goodhue to panick.  Patient states she has had the following readings since leaving the ED.  04/15/2022 - 122/76 04/17/2022 - 126/78 04/18/22 - 122/79

## 2022-04-24 ENCOUNTER — Ambulatory Visit (INDEPENDENT_AMBULATORY_CARE_PROVIDER_SITE_OTHER): Payer: Medicare Other | Admitting: Internal Medicine

## 2022-04-24 ENCOUNTER — Encounter: Payer: Self-pay | Admitting: Internal Medicine

## 2022-04-24 VITALS — BP 146/88 | HR 73 | Temp 97.9°F | Resp 17 | Ht 65.0 in | Wt 182.8 lb

## 2022-04-24 DIAGNOSIS — I10 Essential (primary) hypertension: Secondary | ICD-10-CM | POA: Diagnosis not present

## 2022-04-24 DIAGNOSIS — K219 Gastro-esophageal reflux disease without esophagitis: Secondary | ICD-10-CM | POA: Diagnosis not present

## 2022-04-24 DIAGNOSIS — N200 Calculus of kidney: Secondary | ICD-10-CM

## 2022-04-24 DIAGNOSIS — R1013 Epigastric pain: Secondary | ICD-10-CM

## 2022-04-24 DIAGNOSIS — M545 Low back pain, unspecified: Secondary | ICD-10-CM

## 2022-04-24 NOTE — Progress Notes (Signed)
Patient ID: Dawn Vargas, female   DOB: [birth year], 71 y.o.   MRN: [id number]   Subjective:    Patient ID: Dawn Vargas, female    DOB: 01-27-1951, 71 y.o.   MRN: [id number]   Patient here for  Chief Complaint  Patient presents with   Follow-up   .   HPI ER follow up.  ER - 04/14/22 - back pain. Given MR and prednisone.  CT negative for stones.  Urine clear. Reports was in [place] - carrying/lifting.  Came home 04/07/22 - did not drink a lot of water.  Noticed low back pain.  Started increasing water intake and taking advil. Noticed some urinary urgency and hesitancy.  Increased pain - 04/14/22 - to ER.  W/up as above.  Was prescribed prednisone and MR.  States urinated - mucus and felt better.  [place] to [place].  Intermittent pain the following day.  Subsequently developed decreased appetite, hot flashes and diarrhea.  Stopped muscle relaxer. Felt above was side effects of the medication.  Saw chiropractor a couple of days ago - adjustment.  Is feeling better.  Some twisting - notices some low back pain.  Did notice recently some epigastric discomfort and increased acid reflux.  Felt related to medication taking for back.  Took ginger and papaya.  Desires no further medication.     Past Medical History:  Diagnosis Date   Allergic asthma    Allergic rhinitis    Arthritis    Chronic back pain    s/p surgery for herniated disc 93/89)   Chronic back pain    Complication of anesthesia    Frequent UTI    GERD (gastroesophageal reflux disease)    pt. states no symptoms   Heart murmur    history of   History of bronchitis    History of kidney stones    History of shingles    Hypertension    Hypertension    PONV (postoperative nausea and vomiting)    pt. states history of nausea in 1989   Reactive airway disease    Rotator cuff tear    left shoulder- pt. states healing   Past Surgical History:  Procedure Laterality Date   BACK SURGERY     CHOLECYSTECTOMY  August 25 2012    COLONOSCOPY  2013   ESOPHAGOGASTRODUODENOSCOPY  01/2011   EYE SURGERY     laporoscopy  08/22/1997   VAGINAL HYSTERECTOMY  1999   laparoscopic assisted   Family History  Problem Relation Age of Onset   Dementia Father    Prostate cancer Father    Heart disease Mother        myocardial infarction, congestive heart failure   Hypertension Mother    Heart disease Brother        angioplasty   Hypertension Brother    Hypertension Sister    Breast cancer Cousin        maternal side-50's   Colon cancer Neg Hx    Social History   Socioeconomic History   Marital status: Single    Spouse name: Not on file   Number of children: Not on file   Years of education: Not on file   Highest education level: Not on file  Occupational History   Not on file  Tobacco Use   Smoking status: Never   Smokeless tobacco: Never  Substance and Sexual Activity   Alcohol use: Yes    Alcohol/week: 0.0 standard drinks of alcohol    Comment: socially  Drug use: No   Sexual activity: Not on file  Other Topics Concern   Not on file  Social History Narrative   Not on file   Social Determinants of Health   Financial Resource Strain: Not on file  Food Insecurity: Not on file  Transportation Needs: Not on file  Physical Activity: Not on file  Stress: Not on file  Social Connections: Not on file     Review of Systems  Constitutional:  Negative for unexpected weight change.       Previous decreased appetite.   HENT:  Negative for congestion and sinus pressure.   Respiratory:  Negative for cough, chest tightness and shortness of breath.   Cardiovascular:  Negative for chest pain, palpitations and leg swelling.  Gastrointestinal:  Positive for diarrhea. Negative for nausea and vomiting.       Epigastric discomfort as outlined.   Genitourinary:  Positive for urgency. Negative for difficulty urinating and dysuria.  Musculoskeletal:  Positive for back pain. Negative for joint swelling and myalgias.   Skin:  Negative for color change and rash.  Neurological:  Negative for dizziness and headaches.  Psychiatric/Behavioral:  Negative for agitation and dysphoric mood.        Objective:     BP (!) 146/88   Pulse 73   Temp 97.9 F (36.6 C) (Temporal)   Resp 17   Ht 5\' 5"  (1.651 m)   Wt 182 lb 12.8 oz (82.9 kg)   LMP 05/21/1998   SpO2 98%   BMI 30.42 kg/m  Wt Readings from Last 3 Encounters:  04/24/22 182 lb 12.8 oz (82.9 kg)  02/24/22 174 lb 6.4 oz (79.1 kg)  01/07/22 180 lb 12.4 oz (82 kg)    Physical Exam Vitals reviewed.  Constitutional:      General: She is not in acute distress.    Appearance: Normal appearance.  HENT:     Head: Normocephalic and atraumatic.     Right Ear: External ear normal.     Left Ear: External ear normal.  Eyes:     General: No scleral icterus.       Right eye: No discharge.        Left eye: No discharge.     Conjunctiva/sclera: Conjunctivae normal.  Neck:     Thyroid: No thyromegaly.  Cardiovascular:     Rate and Rhythm: Normal rate and regular rhythm.  Pulmonary:     Effort: No respiratory distress.     Breath sounds: Normal breath sounds. No wheezing.  Abdominal:     General: Bowel sounds are normal.     Palpations: Abdomen is soft.     Tenderness: There is no abdominal tenderness.  Musculoskeletal:        General: No swelling or tenderness.     Cervical back: Neck supple. No tenderness.     Comments: Negative SLR.   Lymphadenopathy:     Cervical: No cervical adenopathy.  Skin:    Findings: No erythema or rash.  Neurological:     Mental Status: She is alert.  Psychiatric:        Mood and Affect: Mood normal.        Behavior: Behavior normal.      Outpatient Encounter Medications as of 04/24/2022  Medication Sig   acetaminophen (TYLENOL) 650 MG CR tablet Take 1,300 mg by mouth at bedtime.   B Complex-C (SUPER B COMPLEX PO) Take 1 tablet by mouth daily.   CRANBERRY PO Take 1 capsule by mouth  daily.   cyclobenzaprine  (FLEXERIL) 10 MG tablet Take 1 tablet (10 mg total) by mouth 3 (three) times daily as needed for muscle spasms.   Glucosamine-Chondroitin-MSM-D (TRIPLE FLEX/VITAMIN D3) TABS Take 1 tablet by mouth daily.   losartan (COZAAR) 25 MG tablet Take 1 tablet by mouth twice daily   Omega-3 Fatty Acids (FISH OIL PO) Take 1 capsule by mouth daily.   Probiotic Product (PROBIOTIC PEARLS PO) Take 1 capsule by mouth daily.   Turmeric 500 MG CAPS Take 500 mg by mouth daily.   No facility-administered encounter medications on file as of 04/24/2022.     Lab Results  Component Value Date   WBC 8.0 04/14/2022   HGB 14.6 04/14/2022   HCT 43.0 04/14/2022   PLT 220 04/14/2022   GLUCOSE 92 04/14/2022   CHOL 159 02/24/2022   TRIG 50.0 02/24/2022   HDL 76.50 02/24/2022   LDLCALC 72 02/24/2022   ALT 18 04/14/2022   AST 22 04/14/2022   NA 131 (L) 04/14/2022   K 3.6 04/14/2022   CL 95 (L) 04/14/2022   CREATININE 0.57 04/14/2022   BUN 9 04/14/2022   CO2 27 04/14/2022   TSH 1.62 08/26/2021   INR 0.98 02/22/2015   HGBA1C 5.5 11/10/2016    CT Renal Stone Study  Result Date: 04/14/2022 CLINICAL DATA:  Flank pain, kidney stone suspected Patient reports bilateral low back pain. Pain with urination. History of kidney stones. EXAM: CT ABDOMEN AND PELVIS WITHOUT CONTRAST TECHNIQUE: Multidetector CT imaging of the abdomen and pelvis was performed following the standard protocol without IV contrast. RADIATION DOSE REDUCTION: This exam was performed according to the departmental dose-optimization program which includes automated exposure control, adjustment of the mA and/or kV according to patient size and/or use of iterative reconstruction technique. COMPARISON:  CT 07/12/2013 FINDINGS: Lower chest: Subsegmental linear atelectasis or scar in the lingula and both lower lobes. 4 mm left lower lobe nodule series 4, image 16. This is stable from 03/10/2012 chest CT and considered benign, needing no further follow-up. No  acute basilar airspace disease or pleural effusion. Hepatobiliary: Liver is prominent size spanning 17.9 cm cranial caudal. No focal hepatic abnormality on this unenhanced exam. Clips in the gallbladder fossa postcholecystectomy. No biliary dilatation. Pancreas: No ductal dilatation or inflammation. Spleen: Normal in size without focal abnormality. Adrenals/Urinary Tract: No adrenal nodule. No hydronephrosis. No perinephric edema. No renal calculi. No evidence of focal renal abnormality. Decompressed ureters without ureteral stone. The urinary bladder is partially distended. No bladder wall thickening. No bladder stone. Stomach/Bowel: Unremarkable appearance of the stomach. No bowel obstruction or inflammation. Normal appendix. Small volume of colonic stool. Minor sigmoid colonic diverticulosis, no diverticulitis. Mild colonic redundancy. Vascular/Lymphatic: Aortic atherosclerosis and tortuosity. No aneurysm. No bulky adenopathy. Reproductive: Status post hysterectomy. No adnexal masses. Other: No free air, free fluid, or intra-abdominal fluid collection. 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 L5-S1 facet hypertrophy. There are no acute or suspicious osseous abnormalities. IMPRESSION: 1. No renal stones or obstructive uropathy. No acute abnormality in the abdomen/pelvis. 2. Minor sigmoid colonic diverticulosis without diverticulitis. Aortic Atherosclerosis (ICD10-I70.0). Electronically Signed   By: Narda RutherfordMelanie  Sanford M.D.   On: 04/14/2022 22:01       Assessment & Plan:   Problem List Items Addressed This Visit     Epigastric pain    Occurred after starting medication to treat back (advil, prednisone).  Discussed.  Wants to hold on PPI.  Check labs, including lipase, cbc, liver panel  and urine.  Follow.  Call with update.        GERD (gastroesophageal reflux disease) - Primary    Noticed acid reflux as outlined.  Recent flare.  Discussed medication.  Wants to  hold on further medication.  Follow.  Call with update.       Hypertension    Blood pressure elevated in ER.  Has been following at home.  Outside blood pressures reviewed - 120s/70s.  Elevated today.  Continue losartan.  Has not tolerated higher doses previously.  Follow pressures.  Send in readings.       Relevant Orders   Basic metabolic panel   Hepatic function panel   TSH   Lipid panel   Low back pain    Back pain as outlined.  Had been doing a lot of lifting and carrying.  CT unrevealing.  Seeing chiropractor.  Off steroids and MR.  Discussed.  Twisting aggravates.  Improved. Wants to monitor.  Hold on further w/up at this time.  Call with update.       Nephrolithiasis    CT - no stone present. Stay hydrated.          Dale [place], MD

## 2022-04-29 ENCOUNTER — Telehealth: Payer: Self-pay | Admitting: Internal Medicine

## 2022-04-29 NOTE — Telephone Encounter (Signed)
  Pt called with update - Still burning with ulcer. Gas and some stomach pain with food. Asking for medication - stated declined at visit, but not getting better.

## 2022-04-29 NOTE — Telephone Encounter (Signed)
Patient was seen by Dr Lorin Picket last week. She has not taken any medication, she has been eating some fruits. Her GI issue is not worse, but not better

## 2022-04-30 ENCOUNTER — Other Ambulatory Visit: Payer: Self-pay

## 2022-04-30 MED ORDER — PANTOPRAZOLE SODIUM 40 MG PO TBEC: 40.0000 mg | DELAYED_RELEASE_TABLET | Freq: Every day | ORAL | 3 refills | Status: DC

## 2022-04-30 NOTE — Telephone Encounter (Signed)
S/w pt - confirmed no issues with protonix - agreeable - rx sent in to [facility] per pt request.  Pt advised to call with update - gave verbal understanding

## 2022-04-30 NOTE — Telephone Encounter (Signed)
She had reported increased acid, epigastric discomfort at appt.  Given persistence, confirm she has not problems taking protonix.  If no, then start protonix 40mg  q day.  Take 30 minutes before breakfast.  Needs to call with update.  If persistent symptoms, will need to be reevaluated.

## 2022-05-04 ENCOUNTER — Encounter: Payer: Self-pay | Admitting: Internal Medicine

## 2022-05-04 DIAGNOSIS — M545 Low back pain, unspecified: Secondary | ICD-10-CM | POA: Insufficient documentation

## 2022-05-04 DIAGNOSIS — R1013 Epigastric pain: Secondary | ICD-10-CM | POA: Insufficient documentation

## 2022-05-04 NOTE — Assessment & Plan Note (Addendum)
Back pain as outlined.  Had been doing a lot of lifting and carrying.  CT unrevealing.  Seeing chiropractor.  Off steroids and MR.  Discussed.  Twisting aggravates.  Improved. Wants to monitor.  Hold on further w/up at this time.  Call with update.

## 2022-05-04 NOTE — Assessment & Plan Note (Signed)
Noticed acid reflux as outlined.  Recent flare.  Discussed medication.  Wants to hold on further medication.  Follow.  Call with update.

## 2022-05-04 NOTE — Assessment & Plan Note (Signed)
Blood pressure elevated in ER.  Has been following at home.  Outside blood pressures reviewed - 120s/70s.  Elevated today.  Continue losartan.  Has not tolerated higher doses previously.  Follow pressures.  Send in readings.

## 2022-05-04 NOTE — Assessment & Plan Note (Signed)
Occurred after starting medication to treat back (advil, prednisone).  Discussed.  Wants to hold on PPI.  Check labs, including lipase, cbc, liver panel and urine.  Follow.  Call with update.

## 2022-05-04 NOTE — Assessment & Plan Note (Signed)
CT - no stone present. Stay hydrated.

## 2022-06-13 DIAGNOSIS — R030 Elevated blood-pressure reading, without diagnosis of hypertension: Secondary | ICD-10-CM | POA: Diagnosis not present

## 2022-06-13 DIAGNOSIS — R21 Rash and other nonspecific skin eruption: Secondary | ICD-10-CM | POA: Diagnosis not present

## 2022-07-28 DIAGNOSIS — K080 Exfoliation of teeth due to systemic causes: Secondary | ICD-10-CM | POA: Diagnosis not present

## 2022-07-29 ENCOUNTER — Other Ambulatory Visit: Payer: Medicare Other

## 2022-07-29 ENCOUNTER — Ambulatory Visit (INDEPENDENT_AMBULATORY_CARE_PROVIDER_SITE_OTHER): Payer: Medicare Other | Admitting: Internal Medicine

## 2022-07-29 ENCOUNTER — Encounter: Payer: Self-pay | Admitting: Internal Medicine

## 2022-07-29 VITALS — BP 160/90 | HR 83 | Temp 97.9°F | Resp 16 | Ht 65.0 in | Wt 178.2 lb

## 2022-07-29 DIAGNOSIS — I10 Essential (primary) hypertension: Secondary | ICD-10-CM

## 2022-07-29 DIAGNOSIS — M549 Dorsalgia, unspecified: Secondary | ICD-10-CM | POA: Diagnosis not present

## 2022-07-29 LAB — HEPATIC FUNCTION PANEL
ALT: 15 U/L (ref 0–35)
AST: 16 U/L (ref 0–37)
Albumin: 4.4 g/dL (ref 3.5–5.2)
Alkaline Phosphatase: 66 U/L (ref 39–117)
Bilirubin, Direct: 0.1 mg/dL (ref 0.0–0.3)
Total Bilirubin: 0.5 mg/dL (ref 0.2–1.2)
Total Protein: 6.8 g/dL (ref 6.0–8.3)

## 2022-07-29 LAB — LIPID PANEL
Cholesterol: 154 mg/dL (ref 0–200)
HDL: 71.4 mg/dL (ref >39.00)
LDL Cholesterol: 73 mg/dL (ref 0–99)
NonHDL: 82.82
Total CHOL/HDL Ratio: 2
Triglycerides: 47 mg/dL (ref 0.0–149.0)
VLDL: 9.4 mg/dL (ref 0.0–40.0)

## 2022-07-29 LAB — BASIC METABOLIC PANEL
BUN: 15 mg/dL (ref 6–23)
CO2: 28 mEq/L (ref 19–32)
Calcium: 9.8 mg/dL (ref 8.4–10.5)
Chloride: 99 mEq/L (ref 96–112)
Creatinine, Ser: 0.6 mg/dL (ref 0.40–1.20)
GFR: 89.34 mL/min (ref >60.00)
Glucose, Bld: 96 mg/dL (ref 70–99)
Potassium: 4 mEq/L (ref 3.5–5.1)
Sodium: 136 mEq/L (ref 135–145)

## 2022-07-29 LAB — TSH: TSH: 2.31 u[IU]/mL (ref 0.35–5.50)

## 2022-07-29 NOTE — Progress Notes (Signed)
Subjective:    Patient ID: Dawn Vargas, female    DOB: 13-Oct-1950, 72 y.o.   MRN: [id number]  Patient here for  Chief Complaint  Patient presents with   Medical Management of Chronic Issues    HPI Work in appt - work in with concerns regarding a pinched nerve - neck.  Reports that 07/18/22 - left shoulder pain - noticed with reaching and stretches.  Noticed a popping.  Used heat and ice.  Took advil.  Worked out.  Felt better.  Still some discomfort - posterior right shoulder and across top of back - left posterior shoulder.  Overall improved.  Wants to monitor.  Due to see chiropractor this pm. Has been - walking - watching diet.  Started protonix after last visit. No chest pain.  Breathing stable.   Past Medical History:  Diagnosis Date   Allergic asthma    Allergic rhinitis    Arthritis    Chronic back pain    s/p surgery for herniated disc 93/89)   Chronic back pain    Complication of anesthesia    Frequent UTI    GERD (gastroesophageal reflux disease)    pt. states no symptoms   Heart murmur    history of   History of bronchitis    History of kidney stones    History of shingles    Hypertension    Hypertension    PONV (postoperative nausea and vomiting)    pt. states history of nausea in 1989   Reactive airway disease    Rotator cuff tear    left shoulder- pt. states healing   Past Surgical History:  Procedure Laterality Date   BACK SURGERY     CHOLECYSTECTOMY  August 25 2012   COLONOSCOPY  2013   ESOPHAGOGASTRODUODENOSCOPY  01/2011   EYE SURGERY     laporoscopy  08/22/1997   VAGINAL HYSTERECTOMY  1999   laparoscopic assisted   Family History  Problem Relation Age of Onset   Dementia Father    Prostate cancer Father    Heart disease Mother        myocardial infarction, congestive heart failure   Hypertension Mother    Heart disease Brother        angioplasty   Hypertension Brother    Hypertension Sister    Breast cancer Cousin        maternal  side-50's   Colon cancer Neg Hx    Social History   Socioeconomic History   Marital status: Single    Spouse name: Not on file   Number of children: Not on file   Years of education: Not on file   Highest education level: Not on file  Occupational History   Not on file  Tobacco Use   Smoking status: Never   Smokeless tobacco: Never  Substance and Sexual Activity   Alcohol use: Yes    Alcohol/week: 0.0 standard drinks of alcohol    Comment: socially   Drug use: No   Sexual activity: Not on file  Other Topics Concern   Not on file  Social History Narrative   Not on file   Social Determinants of Health   Financial Resource Strain: Not on file  Food Insecurity: Not on file  Transportation Needs: Not on file  Physical Activity: Not on file  Stress: Not on file  Social Connections: Not on file     Review of Systems  Constitutional:  Negative for appetite change and fever.  HENT:  Negative for congestion and sinus pressure.   Respiratory:  Negative for cough, chest tightness and shortness of breath.   Cardiovascular:  Negative for chest pain and palpitations.  Gastrointestinal:  Negative for abdominal pain, diarrhea, nausea and vomiting.  Genitourinary:  Negative for difficulty urinating and dysuria.  Musculoskeletal:  Negative for joint swelling and myalgias.       Upper back - posterior shoulder pain as outlined.    Skin:  Negative for color change and rash.  Neurological:  Negative for dizziness and headaches.  Psychiatric/Behavioral:  Negative for agitation and dysphoric mood.        Objective:     BP (!) 160/90   Pulse 83   Temp 97.9 F (36.6 C)   Resp 16   Ht '5\' 5"'$  (1.651 m)   Wt 178 lb 3.2 oz (80.8 kg)   LMP 05/21/1998   SpO2 99%   BMI 29.65 kg/m  Wt Readings from Last 3 Encounters:  07/29/22 178 lb 3.2 oz (80.8 kg)  04/24/22 182 lb 12.8 oz (82.9 kg)  02/24/22 174 lb 6.4 oz (79.1 kg)    Physical Exam Vitals reviewed.  Constitutional:       General: She is not in acute distress.    Appearance: Normal appearance.  HENT:     Head: Normocephalic and atraumatic.     Right Ear: External ear normal.     Left Ear: External ear normal.  Eyes:     General: No scleral icterus.       Right eye: No discharge.        Left eye: No discharge.     Conjunctiva/sclera: Conjunctivae normal.  Neck:     Thyroid: No thyromegaly.  Cardiovascular:     Rate and Rhythm: Normal rate and regular rhythm.  Pulmonary:     Effort: No respiratory distress.     Breath sounds: Normal breath sounds. No wheezing.  Abdominal:     General: Bowel sounds are normal.     Palpations: Abdomen is soft.     Tenderness: There is no abdominal tenderness.  Musculoskeletal:        General: No swelling or tenderness.     Cervical back: Neck supple. No tenderness.  Lymphadenopathy:     Cervical: No cervical adenopathy.  Skin:    Findings: No erythema or rash.  Neurological:     Mental Status: She is alert.  Psychiatric:        Mood and Affect: Mood normal.        Behavior: Behavior normal.      Outpatient Encounter Medications as of 07/29/2022  Medication Sig   acetaminophen (TYLENOL) 650 MG CR tablet Take 1,300 mg by mouth at bedtime.   B Complex-C (SUPER B COMPLEX PO) Take 1 tablet by mouth daily.   CRANBERRY PO Take 1 capsule by mouth daily.   Glucosamine-Chondroitin-MSM-D (TRIPLE FLEX/VITAMIN D3) TABS Take 1 tablet by mouth daily.   losartan (COZAAR) 25 MG tablet Take 1 tablet by mouth twice daily   Omega-3 Fatty Acids (FISH OIL PO) Take 1 capsule by mouth daily.   Probiotic Product (PROBIOTIC PEARLS PO) Take 1 capsule by mouth daily.   Turmeric 500 MG CAPS Take 500 mg by mouth daily.   [DISCONTINUED] cyclobenzaprine (FLEXERIL) 10 MG tablet Take 1 tablet (10 mg total) by mouth 3 (three) times daily as needed for muscle spasms.   [DISCONTINUED] pantoprazole (PROTONIX) 40 MG tablet Take 1 tablet (40 mg total) by mouth daily.   No  facility-administered  encounter medications on file as of 07/29/2022.     Lab Results  Component Value Date   WBC 8.0 04/14/2022   HGB 14.6 04/14/2022   HCT 43.0 04/14/2022   PLT 220 04/14/2022   GLUCOSE 96 07/29/2022   CHOL 154 07/29/2022   TRIG 47.0 07/29/2022   HDL 71.40 07/29/2022   LDLCALC 73 07/29/2022   ALT 15 07/29/2022   AST 16 07/29/2022   NA 136 07/29/2022   K 4.0 07/29/2022   CL 99 07/29/2022   CREATININE 0.60 07/29/2022   BUN 15 07/29/2022   CO2 28 07/29/2022   TSH 2.31 07/29/2022   INR 0.98 02/22/2015   HGBA1C 5.5 11/10/2016    CT Renal Stone Study  Result Date: 04/14/2022 CLINICAL DATA:  Flank pain, kidney stone suspected Patient reports bilateral low back pain. Pain with urination. History of kidney stones. EXAM: CT ABDOMEN AND PELVIS WITHOUT CONTRAST TECHNIQUE: Multidetector CT imaging of the abdomen and pelvis was performed following the standard protocol without IV contrast. RADIATION DOSE REDUCTION: This exam was performed according to the departmental dose-optimization program which includes automated exposure control, adjustment of the mA and/or kV according to patient size and/or use of iterative reconstruction technique. COMPARISON:  CT 07/12/2013 FINDINGS: Lower chest: Subsegmental linear atelectasis or scar in the lingula and both lower lobes. 4 mm left lower lobe nodule series 4, image 16. This is stable from 03/10/2012 chest CT and considered benign, needing no further follow-up. No acute basilar airspace disease or pleural effusion. Hepatobiliary: Liver is prominent size spanning 17.9 cm cranial caudal. No focal hepatic abnormality on this unenhanced exam. Clips in the gallbladder fossa postcholecystectomy. No biliary dilatation. Pancreas: No ductal dilatation or inflammation. Spleen: Normal in size without focal abnormality. Adrenals/Urinary Tract: No adrenal nodule. No hydronephrosis. No perinephric edema. No renal calculi. No evidence of focal renal abnormality. Decompressed  ureters without ureteral stone. The urinary bladder is partially distended. No bladder wall thickening. No bladder stone. Stomach/Bowel: Unremarkable appearance of the stomach. No bowel obstruction or inflammation. Normal appendix. Small volume of colonic stool. Minor sigmoid colonic diverticulosis, no diverticulitis. Mild colonic redundancy. Vascular/Lymphatic: Aortic atherosclerosis and tortuosity. No aneurysm. No bulky adenopathy. Reproductive: Status post hysterectomy. No adnexal masses. Other: No free air, free fluid, or intra-abdominal fluid collection. 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 L5-S1 facet hypertrophy. There are no acute or suspicious osseous abnormalities. IMPRESSION: 1. No renal stones or obstructive uropathy. No acute abnormality in the abdomen/pelvis. 2. Minor sigmoid colonic diverticulosis without diverticulitis. Aortic Atherosclerosis (ICD10-I70.0). Electronically Signed   By: Keith Rake M.D.   On: 04/14/2022 22:01       Assessment & Plan:  Upper back pain Assessment & Plan: Posterior shoulder and upper back pain as outlined.  Has been applying ice and heat.  Taking advil.  Is better.  Due to see chiropractor today.  Discussed further w/up and evaluation.  Wants to monitor.  Follow.     Primary hypertension Assessment & Plan: Blood pressure elevated. Has been following at home.  Outside blood pressures reviewed - 120s/70s.  Elevated today.  Continue losartan.  Has not tolerated higher doses previously.  Follow pressures.  Send in readings. Discussed additional medication.  She prefers to hold on additional medication.  Follow pressures.  Follow metabolic panel.   Orders: -     Lipid panel -     TSH -     Hepatic function panel -     Basic  metabolic panel     Einar Pheasant, MD

## 2022-07-31 ENCOUNTER — Ambulatory Visit: Payer: Medicare Other | Admitting: Internal Medicine

## 2022-08-05 ENCOUNTER — Encounter: Payer: Self-pay | Admitting: Internal Medicine

## 2022-08-05 DIAGNOSIS — M549 Dorsalgia, unspecified: Secondary | ICD-10-CM | POA: Insufficient documentation

## 2022-08-05 NOTE — Assessment & Plan Note (Signed)
Blood pressure elevated. Has been following at home.  Outside blood pressures reviewed - 120s/70s.  Elevated today.  Continue losartan.  Has not tolerated higher doses previously.  Follow pressures.  Send in readings. Discussed additional medication.  She prefers to hold on additional medication.  Follow pressures.  Follow metabolic panel.

## 2022-08-05 NOTE — Assessment & Plan Note (Signed)
Posterior shoulder and upper back pain as outlined.  Has been applying ice and heat.  Taking advil.  Is better.  Due to see chiropractor today.  Discussed further w/up and evaluation.  Wants to monitor.  Follow.

## 2022-09-30 DIAGNOSIS — Z01818 Encounter for other preprocedural examination: Secondary | ICD-10-CM | POA: Diagnosis not present

## 2022-09-30 DIAGNOSIS — R030 Elevated blood-pressure reading, without diagnosis of hypertension: Secondary | ICD-10-CM | POA: Diagnosis not present

## 2022-09-30 DIAGNOSIS — Z83719 Family history of colon polyps, unspecified: Secondary | ICD-10-CM | POA: Diagnosis not present

## 2022-10-14 NOTE — Progress Notes (Unsigned)
[facility] [address] [place] [address] Phone: [phone] Subjective:   [name], am serving as a scribe for Dr. Antoine Primas.  I'm seeing this patient by the request  of:  Dale Saluda, MD  [id number] shoulder injury, right shoulder   [id number]  Seen June 2023 for R knee  Updated 10/15/2022 Dawn Vargas is a 72 y.o. female coming in with complaint of B shoulder pain. Pain in posterior aspect. Was seen in February by primary care provider for left shoulder pain.  Seems to be more on the posterior aspect.  Was seeing a [facility]. Patient states that she feels like she has a rotator cuff tear in the L shoulder. Having hard time sleeping on her side and pulling the seatbelt across her body. Has a big trip coming up in early June.    Neck MRI shows that patient does have significant arthritic changes noted with edema back in 2021.    Past Medical History:  Diagnosis Date   Allergic asthma    Allergic rhinitis    Arthritis    Chronic back pain    s/p surgery for herniated disc 93/89)   Chronic back pain    Complication of anesthesia    Frequent UTI    GERD (gastroesophageal reflux disease)    pt. states no symptoms   Heart murmur    history of   History of bronchitis    History of kidney stones    History of shingles    Hypertension    Hypertension    PONV (postoperative nausea and vomiting)    pt. states history of nausea in 1989   Reactive airway disease    Rotator cuff tear    left shoulder- pt. states healing   Past Surgical History:  Procedure Laterality Date   BACK SURGERY     CHOLECYSTECTOMY  August 25 2012   COLONOSCOPY  2013   ESOPHAGOGASTRODUODENOSCOPY  01/2011   EYE SURGERY     laporoscopy  08/22/1997   VAGINAL HYSTERECTOMY  1999   laparoscopic assisted   Social History   Socioeconomic History   Marital status: Single    Spouse name: Not on file   Number of children: Not on file   Years of  education: Not on file   Highest education level: Not on file  Occupational History   Not on file  Tobacco Use   Smoking status: Never   Smokeless tobacco: Never  Substance and Sexual Activity   Alcohol use: Yes    Alcohol/week: 0.0 standard drinks of alcohol    Comment: socially   Drug use: No   Sexual activity: Not on file  Other Topics Concern   Not on file  Social History Narrative   Not on file   Social Determinants of Health   Financial Resource Strain: Not on file  Food Insecurity: Not on file  Transportation Needs: Not on file  Physical Activity: Not on file  Stress: Not on file  Social Connections: Not on file   Allergies  Allergen Reactions   Other Other (See Comments)    Smoke - severe reactive airway disease    Benadryl [Diphenhydramine Hcl] Hives   Oxycodone Other (See Comments)    dizzy   Beef-Derived Products Other (See Comments)    Gi upset   Caladryl [Pramoxine-Calamine] Hives   Clarithromycin Other (See Comments)    Gi upset   Cleocin [Clindamycin Hcl] Other (See Comments)  GI upset    Egg-Derived Products Diarrhea   Hydrochlorothiazide Other (See Comments)    dizzy   Pegademase Bovine Nausea And Vomiting    Gi upset   Soybean-Containing Drug Products Diarrhea   Tomato Other (See Comments)    Small amount is okay, large amount causes diarrhea - Also potatoes   Family History  Problem Relation Age of Onset   Dementia Father    Prostate cancer Father    Heart disease Mother        myocardial infarction, congestive heart failure   Hypertension Mother    Heart disease Brother        angioplasty   Hypertension Brother    Hypertension Sister    Breast cancer Cousin        maternal side-50's   Colon cancer Neg Hx     Current Outpatient Medications (Endocrine & Metabolic):    predniSONE (DELTASONE) 20 MG tablet, Take 1 tablet (20 mg total) by mouth daily with breakfast.  Current Outpatient Medications (Cardiovascular):    losartan  (COZAAR) 25 MG tablet, Take 1 tablet by mouth twice daily   Current Outpatient Medications (Analgesics):    acetaminophen (TYLENOL) 650 MG CR tablet, Take 1,300 mg by mouth at bedtime.   Current Outpatient Medications (Other):    B Complex-C (SUPER B COMPLEX PO), Take 1 tablet by mouth daily.   CRANBERRY PO, Take 1 capsule by mouth daily.   Glucosamine-Chondroitin-MSM-D (TRIPLE FLEX/VITAMIN D3) TABS, Take 1 tablet by mouth daily.   Omega-3 Fatty Acids (FISH OIL PO), Take 1 capsule by mouth daily.   Probiotic Product (PROBIOTIC PEARLS PO), Take 1 capsule by mouth daily.   Turmeric 500 MG CAPS, Take 500 mg by mouth daily.   Reviewed prior external information including notes and imaging from  primary care provider As well as notes that were available from care everywhere and other healthcare systems.  Past medical history, social, surgical and family history all reviewed in electronic medical record.  No pertanent information unless stated regarding to the chief complaint.   Review of Systems:  No headache, visual changes, nausea, vomiting, diarrhea, constipation, dizziness, abdominal pain, skin rash, fevers, chills, night sweats, weight loss, swollen lymph nodes, body aches, joint swelling, chest pain, shortness of breath, mood changes. POSITIVE muscle aches  Objective  Blood pressure (!) 140/90, pulse 67, height 5\' 5"  (1.651 m), weight 180 lb (81.6 kg), last menstrual period 05/21/1998, SpO2 98 %.   General: No apparent distress alert and oriented x3 mood and affect normal, dressed appropriately.  HEENT: Pupils equal, extraocular movements intact  Respiratory: Patient's speak in full sentences and does not appear short of breath  Cardiovascular: No lower extremity edema, non tender, no erythema  Patient does have crepitus of the shoulders bilaterally.  Positive impingement noted.  Neurovascular intact distally.  Negative Spurling's of the neck but does have some limited range of  motion  Procedure: Real-time Ultrasound Guided Injection of right glenohumeral joint Device: GE Logiq Q7  Ultrasound guided injection is preferred based studies that show increased duration, increased effect, greater accuracy, decreased procedural pain, increased response rate with ultrasound guided versus blind injection.  Verbal informed consent obtained.  Time-out conducted.  Noted no overlying erythema, induration, or other signs of local infection.  Skin prepped in a sterile fashion.  Local anesthesia: Topical Ethyl chloride.  With sterile technique and under real time ultrasound guidance:  Joint visualized.  23g 1  inch needle inserted posterior approach. Pictures taken for needle  placement. Patient did have injection of 2 cc of 0.5% Marcaine, and 1.0 cc of Kenalog 40 mg/dL. Completed without difficulty  Pain immediately resolved suggesting accurate placement of the medication.  Advised to call if fevers/chills, erythema, induration, drainage, or persistent bleeding.  Impression: Technically successful ultrasound guided injection.  Procedure: Real-time Ultrasound Guided Injection of left glenohumeral joint Device: GE Logiq E  Ultrasound guided injection is preferred based studies that show increased duration, increased effect, greater accuracy, decreased procedural pain, increased response rate with ultrasound guided versus blind injection.  Verbal informed consent obtained.  Time-out conducted.  Noted no overlying erythema, induration, or other signs of local infection.  Skin prepped in a sterile fashion.  Local anesthesia: Topical Ethyl chloride.  With sterile technique and under real time ultrasound guidance:  Joint visualized.  21g 2 inch needle inserted posterior approach. Pictures taken for needle placement. Patient did have injection of 2 cc of 0.5% Marcaine, and 1cc of Kenalog 40 mg/dL. Completed without difficulty  Pain immediately resolved suggesting accurate placement of  the medication.  Advised to call if fevers/chills, erythema, induration, drainage, or persistent bleeding.  Images permanently stored and available for review in the ultrasound unit.  Impression: Technically successful ultrasound guided injection.    Impression and Recommendations:    The above documentation has been reviewed and is accurate and complete Judi Saa, DO

## 2022-10-15 ENCOUNTER — Other Ambulatory Visit: Payer: Self-pay

## 2022-10-15 ENCOUNTER — Ambulatory Visit (INDEPENDENT_AMBULATORY_CARE_PROVIDER_SITE_OTHER): Payer: Medicare Other

## 2022-10-15 ENCOUNTER — Ambulatory Visit: Payer: Medicare Other | Admitting: Family Medicine

## 2022-10-15 VITALS — BP 140/90 | HR 67 | Ht 65.0 in | Wt 180.0 lb

## 2022-10-15 DIAGNOSIS — M542 Cervicalgia: Secondary | ICD-10-CM

## 2022-10-15 DIAGNOSIS — M47812 Spondylosis without myelopathy or radiculopathy, cervical region: Secondary | ICD-10-CM | POA: Diagnosis not present

## 2022-10-15 DIAGNOSIS — M25512 Pain in left shoulder: Secondary | ICD-10-CM

## 2022-10-15 DIAGNOSIS — M25511 Pain in right shoulder: Secondary | ICD-10-CM

## 2022-10-15 DIAGNOSIS — M7551 Bursitis of right shoulder: Secondary | ICD-10-CM | POA: Diagnosis not present

## 2022-10-15 DIAGNOSIS — M75102 Unspecified rotator cuff tear or rupture of left shoulder, not specified as traumatic: Secondary | ICD-10-CM

## 2022-10-15 DIAGNOSIS — M19012 Primary osteoarthritis, left shoulder: Secondary | ICD-10-CM | POA: Diagnosis not present

## 2022-10-15 MED ORDER — PREDNISONE 20 MG PO TABS: 20.0000 mg | ORAL_TABLET | Freq: Every day | ORAL | 0 refills | Status: DC

## 2022-10-15 NOTE — Assessment & Plan Note (Signed)
Discussed HEP Discussed keeping hands within peripheral vision.  Have done relatively well but given injections to help with the force she goes on her trip.  Has had the rotator cuff tear since 2016.  Hopefully patient will do well with conservative therapy longer.  Follow-up with me again 6 to 8 weeks

## 2022-10-15 NOTE — Patient Instructions (Signed)
Injected both shoulder Xrays on your way out Prednisone 20mg  for 7 days See me again in 7-8 weeks

## 2022-10-15 NOTE — Assessment & Plan Note (Signed)
Wortsening pain, likely some RTC pathology  Patient has responded extremely well to the injection previously.  Patient does have the rotator cuff at the right well.  Follow-up again in 6 to 8 weeks with a refresher of the exercises.  Given prednisone for breakthrough pain.

## 2022-10-27 ENCOUNTER — Ambulatory Visit: Payer: Medicare Other | Admitting: Internal Medicine

## 2022-10-28 ENCOUNTER — Ambulatory Visit: Payer: Medicare Other | Admitting: Family Medicine

## 2022-10-31 ENCOUNTER — Encounter: Payer: Self-pay | Admitting: Internal Medicine

## 2022-10-31 ENCOUNTER — Ambulatory Visit (INDEPENDENT_AMBULATORY_CARE_PROVIDER_SITE_OTHER): Payer: Medicare Other | Admitting: Internal Medicine

## 2022-10-31 VITALS — BP 127/70 | HR 69 | Temp 98.1°F | Ht 65.0 in | Wt 174.6 lb

## 2022-10-31 DIAGNOSIS — Z83719 Family history of colon polyps, unspecified: Secondary | ICD-10-CM | POA: Diagnosis not present

## 2022-10-31 DIAGNOSIS — M542 Cervicalgia: Secondary | ICD-10-CM | POA: Diagnosis not present

## 2022-10-31 DIAGNOSIS — M75102 Unspecified rotator cuff tear or rupture of left shoulder, not specified as traumatic: Secondary | ICD-10-CM | POA: Diagnosis not present

## 2022-10-31 DIAGNOSIS — I10 Essential (primary) hypertension: Secondary | ICD-10-CM

## 2022-10-31 MED ORDER — LOSARTAN POTASSIUM 25 MG PO TABS: 25.0000 mg | ORAL_TABLET | Freq: Two times a day (BID) | ORAL | 1 refills | Status: DC

## 2022-10-31 NOTE — Progress Notes (Unsigned)
Subjective:    Patient ID: Dawn Vargas, female    DOB: 1950/10/06, 72 y.o.   MRN: [id number]  Patient here for  Chief Complaint  Patient presents with   Medical Management of Chronic Issues    HPI Here to follow up regarding hypertension.  Saw Dr Katrinka Blazing 10/15/22 - for shoulder pain.  S/p injections and prednisone.  Xrays.  Colonoscopy planned for 12/30/22.   Past Medical History:  Diagnosis Date   Allergic asthma    Allergic rhinitis    Arthritis    Chronic back pain    s/p surgery for herniated disc 93/89)   Chronic back pain    Complication of anesthesia    Frequent UTI    GERD (gastroesophageal reflux disease)    pt. states no symptoms   Heart murmur    history of   History of bronchitis    History of kidney stones    History of shingles    Hypertension    Hypertension    PONV (postoperative nausea and vomiting)    pt. states history of nausea in 1989   Reactive airway disease    Rotator cuff tear    left shoulder- pt. states healing   Past Surgical History:  Procedure Laterality Date   BACK SURGERY     CHOLECYSTECTOMY  August 25 2012   COLONOSCOPY  2013   ESOPHAGOGASTRODUODENOSCOPY  01/2011   EYE SURGERY     laporoscopy  08/22/1997   VAGINAL HYSTERECTOMY  1999   laparoscopic assisted   Family History  Problem Relation Age of Onset   Dementia Father    Prostate cancer Father    Heart disease Mother        myocardial infarction, congestive heart failure   Hypertension Mother    Heart disease Brother        angioplasty   Hypertension Brother    Hypertension Sister    Breast cancer Cousin        maternal side-50's   Colon cancer Neg Hx    Social History   Socioeconomic History   Marital status: Single    Spouse name: Not on file   Number of children: Not on file   Years of education: Not on file   Highest education level: Not on file  Occupational History   Not on file  Tobacco Use   Smoking status: Never   Smokeless tobacco: Never   Substance and Sexual Activity   Alcohol use: Yes    Alcohol/week: 0.0 standard drinks of alcohol    Comment: socially   Drug use: No   Sexual activity: Not on file  Other Topics Concern   Not on file  Social History Narrative   Not on file   Social Determinants of Health   Financial Resource Strain: Not on file  Food Insecurity: Not on file  Transportation Needs: Not on file  Physical Activity: Not on file  Stress: Not on file  Social Connections: Not on file     Review of Systems     Objective:     BP 127/70   Pulse 69   Temp 98.1 F (36.7 C) (Oral)   Ht 5\' 5"  (1.651 m)   Wt 174 lb 9.6 oz (79.2 kg)   LMP 05/21/1998   SpO2 97%   BMI 29.05 kg/m  Wt Readings from Last 3 Encounters:  10/31/22 174 lb 9.6 oz (79.2 kg)  10/15/22 180 lb (81.6 kg)  07/29/22 178 lb 3.2 oz (80.8  kg)    Physical Exam   Outpatient Encounter Medications as of 10/31/2022  Medication Sig   acetaminophen (TYLENOL) 650 MG CR tablet Take 1,300 mg by mouth at bedtime.   B Complex-C (SUPER B COMPLEX PO) Take 1 tablet by mouth daily.   CRANBERRY PO Take 1 capsule by mouth daily.   Glucosamine-Chondroitin-MSM-D (TRIPLE FLEX/VITAMIN D3) TABS Take 1 tablet by mouth daily.   Omega-3 Fatty Acids (FISH OIL PO) Take 1 capsule by mouth daily.   predniSONE (DELTASONE) 20 MG tablet Take 1 tablet (20 mg total) by mouth daily with breakfast.   Probiotic Product (PROBIOTIC PEARLS PO) Take 1 capsule by mouth daily.   Turmeric 500 MG CAPS Take 500 mg by mouth daily.   [DISCONTINUED] losartan (COZAAR) 25 MG tablet Take 1 tablet by mouth twice daily   losartan (COZAAR) 25 MG tablet Take 1 tablet (25 mg total) by mouth 2 (two) times daily.   No facility-administered encounter medications on file as of 10/31/2022.     Lab Results  Component Value Date   WBC 8.0 04/14/2022   HGB 14.6 04/14/2022   HCT 43.0 04/14/2022   PLT 220 04/14/2022   GLUCOSE 96 07/29/2022   CHOL 154 07/29/2022   TRIG 47.0  07/29/2022   HDL 71.40 07/29/2022   LDLCALC 73 07/29/2022   ALT 15 07/29/2022   AST 16 07/29/2022   NA 136 07/29/2022   K 4.0 07/29/2022   CL 99 07/29/2022   CREATININE 0.60 07/29/2022   BUN 15 07/29/2022   CO2 28 07/29/2022   TSH 2.31 07/29/2022   INR 0.98 02/22/2015   HGBA1C 5.5 11/10/2016    CT Renal Stone Study  Result Date: 04/14/2022 CLINICAL DATA:  Flank pain, kidney stone suspected Patient reports bilateral low back pain. Pain with urination. History of kidney stones. EXAM: CT ABDOMEN AND PELVIS WITHOUT CONTRAST TECHNIQUE: Multidetector CT imaging of the abdomen and pelvis was performed following the standard protocol without IV contrast. RADIATION DOSE REDUCTION: This exam was performed according to the departmental dose-optimization program which includes automated exposure control, adjustment of the mA and/or kV according to patient size and/or use of iterative reconstruction technique. COMPARISON:  CT 07/12/2013 FINDINGS: Lower chest: Subsegmental linear atelectasis or scar in the lingula and both lower lobes. 4 mm left lower lobe nodule series 4, image 16. This is stable from 03/10/2012 chest CT and considered benign, needing no further follow-up. No acute basilar airspace disease or pleural effusion. Hepatobiliary: Liver is prominent size spanning 17.9 cm cranial caudal. No focal hepatic abnormality on this unenhanced exam. Clips in the gallbladder fossa postcholecystectomy. No biliary dilatation. Pancreas: No ductal dilatation or inflammation. Spleen: Normal in size without focal abnormality. Adrenals/Urinary Tract: No adrenal nodule. No hydronephrosis. No perinephric edema. No renal calculi. No evidence of focal renal abnormality. Decompressed ureters without ureteral stone. The urinary bladder is partially distended. No bladder wall thickening. No bladder stone. Stomach/Bowel: Unremarkable appearance of the stomach. No bowel obstruction or inflammation. Normal appendix. Small  volume of colonic stool. Minor sigmoid colonic diverticulosis, no diverticulitis. Mild colonic redundancy. Vascular/Lymphatic: Aortic atherosclerosis and tortuosity. No aneurysm. No bulky adenopathy. Reproductive: Status post hysterectomy. No adnexal masses. Other: No free air, free fluid, or intra-abdominal fluid collection. 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 L5-S1 facet hypertrophy. There are no acute or suspicious osseous abnormalities. IMPRESSION: 1. No renal stones or obstructive uropathy. No acute abnormality in the abdomen/pelvis. 2. Minor sigmoid colonic diverticulosis without diverticulitis. Aortic  Atherosclerosis (ICD10-I70.0). Electronically Signed   By: Narda Rutherford M.D.   On: 04/14/2022 22:01       Assessment & Plan:  Primary hypertension -     Basic metabolic panel; Future -     Hepatic function panel; Future -     Lipid panel; Future  Other orders -     Losartan Potassium; Take 1 tablet (25 mg total) by mouth 2 (two) times daily.  Dispense: 180 tablet; Refill: 1     Dale [place], MD

## 2022-11-01 ENCOUNTER — Encounter: Payer: Self-pay | Admitting: Internal Medicine

## 2022-11-01 NOTE — Assessment & Plan Note (Signed)
Blood pressure elevated. Has been following at home.  Outside blood pressures reviewed - 120s/70s.  Elevated today.  Continue losartan.  Has not tolerated higher doses previously.  Follow pressures.  Send in readings. Have previously discussed additional medication.  She has preferred to hold on additional medication.  Follow pressures.  Follow metabolic panel.

## 2022-11-01 NOTE — Assessment & Plan Note (Signed)
Better s/p shoulder injection.

## 2022-11-01 NOTE — Assessment & Plan Note (Signed)
Colonoscopy planned for 12/30/22.

## 2022-11-01 NOTE — Assessment & Plan Note (Signed)
S/p shoulder injections.  Pain better.  Follow.

## 2022-11-27 NOTE — Progress Notes (Deleted)
[name] [address] [place] [address] Phone: [phone] Subjective:    I'm seeing this patient by the request  of:  Dale Sylva, MD  CC:   [id number]  10/15/2022 Discussed HEP Discussed keeping hands within peripheral vision.  Have done relatively well but given injections to help with the force she goes on her trip.  Has had the rotator cuff tear since 2016.  Hopefully patient will do well with conservative therapy longer.  Follow-up with me again 6 to 8 weeks  Wortsening pain, likely some RTC pathology  Patient has responded extremely well to the injection previously.  Patient does have the rotator cuff at the right well.  Follow-up again in 6 to 8 weeks with a refresher of the exercises.  Given prednisone for breakthrough pain.  Updated 12/03/2022 Dawn Vargas is a 72 y.o. female coming in with complaint of L shoulder pain       Past Medical History:  Diagnosis Date   Allergic asthma    Allergic rhinitis    Arthritis    Chronic back pain    s/p surgery for herniated disc 93/89)   Chronic back pain    Complication of anesthesia    Frequent UTI    GERD (gastroesophageal reflux disease)    pt. states no symptoms   Heart murmur    history of   History of bronchitis    History of kidney stones    History of shingles    Hypertension    Hypertension    PONV (postoperative nausea and vomiting)    pt. states history of nausea in 1989   Reactive airway disease    Rotator cuff tear    left shoulder- pt. states healing   Past Surgical History:  Procedure Laterality Date   BACK SURGERY     CHOLECYSTECTOMY  August 25 2012   COLONOSCOPY  2013   ESOPHAGOGASTRODUODENOSCOPY  01/2011   EYE SURGERY     laporoscopy  08/22/1997   VAGINAL HYSTERECTOMY  1999   laparoscopic assisted   Social History   Socioeconomic History   Marital status: Single    Spouse name: Not on file   Number of children: Not on file   Years of  education: Not on file   Highest education level: Not on file  Occupational History   Not on file  Tobacco Use   Smoking status: Never   Smokeless tobacco: Never  Substance and Sexual Activity   Alcohol use: Yes    Alcohol/week: 0.0 standard drinks of alcohol    Comment: socially   Drug use: No   Sexual activity: Not on file  Other Topics Concern   Not on file  Social History Narrative   Not on file   Social Determinants of Health   Financial Resource Strain: Not on file  Food Insecurity: Not on file  Transportation Needs: Not on file  Physical Activity: Not on file  Stress: Not on file  Social Connections: Not on file   Allergies  Allergen Reactions   Other Other (See Comments)    Smoke - severe reactive airway disease    Benadryl [Diphenhydramine Hcl] Hives   Oxycodone Other (See Comments)    dizzy   Beef-Derived Products Other (See Comments)    Gi upset   Caladryl [Pramoxine-Calamine] Hives   Clarithromycin Other (See Comments)    Gi upset   Cleocin [Clindamycin Hcl] Other (See Comments)    GI upset  Egg-Derived Products Diarrhea   Hydrochlorothiazide Other (See Comments)    dizzy   Pegademase Bovine Nausea And Vomiting    Gi upset   Soybean-Containing Drug Products Diarrhea   Tomato Other (See Comments)    Small amount is okay, large amount causes diarrhea - Also potatoes   Family History  Problem Relation Age of Onset   Dementia Father    Prostate cancer Father    Heart disease Mother        myocardial infarction, congestive heart failure   Hypertension Mother    Heart disease Brother        angioplasty   Hypertension Brother    Hypertension Sister    Breast cancer Cousin        maternal side-50's   Colon cancer Neg Hx     Current Outpatient Medications (Endocrine & Metabolic):    predniSONE (DELTASONE) 20 MG tablet, Take 1 tablet (20 mg total) by mouth daily with breakfast.  Current Outpatient Medications (Cardiovascular):    losartan  (COZAAR) 25 MG tablet, Take 1 tablet (25 mg total) by mouth 2 (two) times daily.   Current Outpatient Medications (Analgesics):    acetaminophen (TYLENOL) 650 MG CR tablet, Take 1,300 mg by mouth at bedtime.   Current Outpatient Medications (Other):    B Complex-C (SUPER B COMPLEX PO), Take 1 tablet by mouth daily.   CRANBERRY PO, Take 1 capsule by mouth daily.   Glucosamine-Chondroitin-MSM-D (TRIPLE FLEX/VITAMIN D3) TABS, Take 1 tablet by mouth daily.   Omega-3 Fatty Acids (FISH OIL PO), Take 1 capsule by mouth daily.   Probiotic Product (PROBIOTIC PEARLS PO), Take 1 capsule by mouth daily.   Turmeric 500 MG CAPS, Take 500 mg by mouth daily.   Reviewed prior external information including notes and imaging from  primary care provider As well as notes that were available from care everywhere and other healthcare systems.  Past medical history, social, surgical and family history all reviewed in electronic medical record.  No pertanent information unless stated regarding to the chief complaint.   Review of Systems:  No headache, visual changes, nausea, vomiting, diarrhea, constipation, dizziness, abdominal pain, skin rash, fevers, chills, night sweats, weight loss, swollen lymph nodes, body aches, joint swelling, chest pain, shortness of breath, mood changes. POSITIVE muscle aches  Objective  Last menstrual period 05/21/1998.   General: No apparent distress alert and oriented x3 mood and affect normal, dressed appropriately.  HEENT: Pupils equal, extraocular movements intact  Respiratory: Patient's speak in full sentences and does not appear short of breath  Cardiovascular: No lower extremity edema, non tender, no erythema      Impression and Recommendations:

## 2022-12-03 ENCOUNTER — Ambulatory Visit: Payer: Medicare Other | Admitting: Family Medicine

## 2022-12-30 ENCOUNTER — Ambulatory Visit: Admit: 2022-12-30 | Payer: Medicare Other

## 2022-12-30 SURGERY — COLONOSCOPY WITH PROPOFOL
Anesthesia: General

## 2023-02-12 NOTE — Progress Notes (Signed)
[name] [address] [place] [address] Phone: [phone] Subjective:   Dawn Vargas, am serving as a scribe for Dr. Antoine Primas.  I'm seeing this patient by the request  of:  Dale [place], MD  CC: Chronic problem worsening symptoms after fall  Dawn Vargas  Dawn Vargas is a 72 y.o. female coming in with complaint of L hip pain. Last seen for L shoulder pain in May. Patient states that she fell into the bathtub in July. Had pain in L glute and was unable initially to put her leg into FABER position. Patient has pain that has continued in glute and radiates into the lateral aspect of L leg. Took prednisone in August which helped her pain. Leaving on Friday for a 3 week vacation.       Past Medical History:  Diagnosis Date   Allergic asthma    Allergic rhinitis    Arthritis    Chronic back pain    s/p surgery for herniated disc 93/89)   Chronic back pain    Complication of anesthesia    Frequent UTI    GERD (gastroesophageal reflux disease)    pt. states no symptoms   Heart murmur    history of   History of bronchitis    History of kidney stones    History of shingles    Hypertension    Hypertension    PONV (postoperative nausea and vomiting)    pt. states history of nausea in 1989   Reactive airway disease    Rotator cuff tear    left shoulder- pt. states healing   Past Surgical History:  Procedure Laterality Date   BACK SURGERY     CHOLECYSTECTOMY  August 25 2012   COLONOSCOPY  2013   ESOPHAGOGASTRODUODENOSCOPY  01/2011   EYE SURGERY     laporoscopy  08/22/1997   VAGINAL HYSTERECTOMY  1999   laparoscopic assisted   Social History   Socioeconomic History   Marital status: Single    Spouse name: Not on file   Number of children: Not on file   Years of education: Not on file   Highest education level: Not on file  Occupational History   Not on file  Tobacco Use   Smoking status: Never   Smokeless  tobacco: Never  Substance and Sexual Activity   Alcohol use: Yes    Alcohol/week: 0.0 standard drinks of alcohol    Comment: socially   Drug use: No   Sexual activity: Not on file  Other Topics Concern   Not on file  Social History Narrative   Not on file   Social Determinants of Health   Financial Resource Strain: Not on file  Food Insecurity: Not on file  Transportation Needs: Not on file  Physical Activity: Not on file  Stress: Not on file  Social Connections: Not on file   Allergies  Allergen Reactions   Other Other (See Comments)    Smoke - severe reactive airway disease    Benadryl [Diphenhydramine Hcl] Hives   Oxycodone Other (See Comments)    dizzy   Beef-Derived Products Other (See Comments)    Gi upset   Caladryl [Pramoxine-Calamine] Hives   Clarithromycin Other (See Comments)    Gi upset   Cleocin [Clindamycin Hcl] Other (See Comments)    GI upset    Egg-Derived Products Diarrhea   Hydrochlorothiazide Other (See Comments)    dizzy   Pegademase Bovine Nausea And Vomiting  Gi upset   Soybean-Containing Drug Products Diarrhea   Tomato Other (See Comments)    Small amount is okay, large amount causes diarrhea - Also potatoes   Family History  Problem Relation Age of Onset   Dementia Father    Prostate cancer Father    Heart disease Mother        myocardial infarction, congestive heart failure   Hypertension Mother    Heart disease Brother        angioplasty   Hypertension Brother    Hypertension Sister    Breast cancer Cousin        maternal side-50's   Colon cancer Neg Hx     Current Outpatient Medications (Endocrine & Metabolic):    predniSONE (DELTASONE) 20 MG tablet, Take 1 tablet (20 mg total) by mouth daily with breakfast.  Current Outpatient Medications (Cardiovascular):    losartan (COZAAR) 25 MG tablet, Take 1 tablet by mouth twice daily   Current Outpatient Medications (Analgesics):    acetaminophen (TYLENOL) 650 MG CR tablet,  Take 1,300 mg by mouth at bedtime.   Current Outpatient Medications (Other):    B Complex-C (SUPER B COMPLEX PO), Take 1 tablet by mouth daily.   CRANBERRY PO, Take 1 capsule by mouth daily.   Glucosamine-Chondroitin-MSM-D (TRIPLE FLEX/VITAMIN D3) TABS, Take 1 tablet by mouth daily.   Omega-3 Fatty Acids (FISH OIL PO), Take 1 capsule by mouth daily.   Probiotic Product (PROBIOTIC PEARLS PO), Take 1 capsule by mouth daily.   tiZANidine (ZANAFLEX) 2 MG tablet, Take 1 tablet (2 mg total) by mouth at bedtime.   Turmeric 500 MG CAPS, Take 500 mg by mouth daily.   Reviewed prior external information including notes and imaging from  primary care provider As well as notes that were available from care everywhere and other healthcare systems.  Past medical history, social, surgical and family history all reviewed in electronic medical record.  No pertanent information unless stated regarding to the chief complaint.   Review of Systems:  No headache, visual changes, nausea, vomiting, diarrhea, constipation, dizziness, abdominal pain, skin rash, fevers, chills, night sweats, weight loss, swollen lymph nodes, body aches, joint swelling, chest pain, shortness of breath, mood changes. POSITIVE muscle aches  Objective  Blood pressure (!) 132/98, pulse 71, height 5\' 5"  (1.651 m), weight 180 lb (81.6 kg), last menstrual period 05/21/1998, SpO2 93%.   General: No apparent distress alert and oriented x3 mood and affect normal, dressed appropriately.  HEENT: Pupils equal, extraocular movements intact  Respiratory: Patient's speak in full sentences and does not appear short of breath  Cardiovascular: No lower extremity edema, non tender, no erythema  Patient does have severe tenderness to palpation over the greater trochanteric area.  Good range of motion of the hip noted today.  Negative straight leg test.  Mild pain over the sacroiliac joint   Procedure: Real-time Ultrasound Guided Injection of left   greater trochanteric bursitis secondary to patient's body habitus Device: GE Logiq Q7  Ultrasound guided injection is preferred based studies that show increased duration, increased effect, greater accuracy, decreased procedural pain, increased response rate, and decreased cost with ultrasound guided versus blind injection.  Verbal informed consent obtained.  Time-out conducted.  Noted no overlying erythema, induration, or other signs of local infection.  Skin prepped in a sterile fashion.  Local anesthesia: Topical Ethyl chloride.  With sterile technique and under real time ultrasound guidance:  Greater trochanteric area was visualized and patient's bursa was noted. A 22-gauge  3 inch needle was inserted and 4 cc of 0.5% Marcaine and 1 cc of Kenalog 40 mg/dL was injected. Pictures taken Completed without difficulty  Pain immediately improved suggesting accurate placement of the medication.  Advised to call if fevers/chills, erythema, induration, drainage, or persistent bleeding.  Impression: Technically successful ultrasound guided injection.   Impression and Recommendations:    The above documentation has been reviewed and is accurate and complete Judi Saa, DO

## 2023-02-13 ENCOUNTER — Other Ambulatory Visit: Payer: Self-pay | Admitting: Internal Medicine

## 2023-02-16 ENCOUNTER — Other Ambulatory Visit: Payer: Self-pay

## 2023-02-16 ENCOUNTER — Ambulatory Visit: Payer: Medicare Other | Admitting: Family Medicine

## 2023-02-16 ENCOUNTER — Encounter: Payer: Self-pay | Admitting: Family Medicine

## 2023-02-16 VITALS — BP 132/98 | HR 71 | Ht 65.0 in | Wt 180.0 lb

## 2023-02-16 DIAGNOSIS — M7072 Other bursitis of hip, left hip: Secondary | ICD-10-CM | POA: Diagnosis not present

## 2023-02-16 DIAGNOSIS — M7070 Other bursitis of hip, unspecified hip: Secondary | ICD-10-CM

## 2023-02-16 DIAGNOSIS — M25552 Pain in left hip: Secondary | ICD-10-CM | POA: Diagnosis not present

## 2023-02-16 MED ORDER — TIZANIDINE HCL 2 MG PO TABS: 2.0000 mg | ORAL_TABLET | Freq: Every day | ORAL | 0 refills | Status: DC

## 2023-02-16 MED ORDER — PREDNISONE 20 MG PO TABS: 20.0000 mg | ORAL_TABLET | Freq: Every day | ORAL | 0 refills | Status: DC

## 2023-02-16 NOTE — Patient Instructions (Signed)
Exercises Prednsione 20mg  for 7 days Zanaflex at night See me in 7-8 weeks

## 2023-02-16 NOTE — Assessment & Plan Note (Signed)
Exacerbated secondary to a fall recently.  Discussed icing regimen and home exercises, discussed which activities to do and which ones to avoid.  Discussed with patient traveling given a dose of prednisone as well.  Given also Zanaflex and warned of potential side effects.  Patient will be traveling and driving nearly 3220 miles.  Follow-up with me again in 7 to 8 weeks otherwise.

## 2023-03-18 ENCOUNTER — Other Ambulatory Visit (INDEPENDENT_AMBULATORY_CARE_PROVIDER_SITE_OTHER): Payer: Medicare Other

## 2023-03-18 DIAGNOSIS — I10 Essential (primary) hypertension: Secondary | ICD-10-CM | POA: Diagnosis not present

## 2023-03-18 LAB — BASIC METABOLIC PANEL
BUN: 12 mg/dL (ref 6–23)
CO2: 31 meq/L (ref 19–32)
Calcium: 9.4 mg/dL (ref 8.4–10.5)
Chloride: 102 meq/L (ref 96–112)
Creatinine, Ser: 0.6 mg/dL (ref 0.40–1.20)
GFR: 88.94 mL/min (ref >60.00)
Glucose, Bld: 90 mg/dL (ref 70–99)
Potassium: 3.9 meq/L (ref 3.5–5.1)
Sodium: 139 meq/L (ref 135–145)

## 2023-03-18 LAB — HEPATIC FUNCTION PANEL
ALT: 14 U/L (ref 0–35)
AST: 15 U/L (ref 0–37)
Albumin: 4.1 g/dL (ref 3.5–5.2)
Alkaline Phosphatase: 59 U/L (ref 39–117)
Bilirubin, Direct: 0.1 mg/dL (ref 0.0–0.3)
Total Bilirubin: 0.4 mg/dL (ref 0.2–1.2)
Total Protein: 6 g/dL (ref 6.0–8.3)

## 2023-03-18 LAB — LIPID PANEL
Cholesterol: 156 mg/dL (ref 0–200)
HDL: 71.4 mg/dL (ref >39.00)
LDL Cholesterol: 73 mg/dL (ref 0–99)
NonHDL: 84.41
Total CHOL/HDL Ratio: 2
Triglycerides: 55 mg/dL (ref 0.0–149.0)
VLDL: 11 mg/dL (ref 0.0–40.0)

## 2023-03-20 ENCOUNTER — Telehealth: Payer: Self-pay

## 2023-03-20 NOTE — Telephone Encounter (Signed)
-----   Message from Dale [place] sent at 03/19/2023  4:39 AM EDT ----- See me before calling. Notify - cholesterol levels are good.  Given calculated cholesterol risk, it is recommended for her to start a cholesterol medication.  We can discuss this more at her upcoming appt (make note on schedule). Kidney function tests and liver function tests are within normal limits.

## 2023-03-23 ENCOUNTER — Telehealth: Payer: Self-pay | Admitting: Internal Medicine

## 2023-03-23 NOTE — Telephone Encounter (Signed)
Copied from CRM [id number]. Topic: Medicare AWV >> Mar 23, 2023 11:16 AM Payton Doughty wrote: Reason for CRM: Called LVM 03/23/2023 to schedule Annual Wellness Visit  Verlee Rossetti; Care Guide Ambulatory Clinical Support [place] [facility] Direct Dial: [phone]

## 2023-03-26 DIAGNOSIS — K080 Exfoliation of teeth due to systemic causes: Secondary | ICD-10-CM | POA: Diagnosis not present

## 2023-04-01 ENCOUNTER — Telehealth: Payer: Self-pay

## 2023-04-01 NOTE — Telephone Encounter (Signed)
Lvm for pt to give office a call back

## 2023-04-01 NOTE — Telephone Encounter (Signed)
-----   Message from Dale [place] sent at 03/19/2023  4:39 AM EDT ----- See me before calling. Notify - cholesterol levels are good.  Given calculated cholesterol risk, it is recommended for her to start a cholesterol medication.  We can discuss this more at her upcoming appt (make note on schedule). Kidney function tests and liver function tests are within normal limits.

## 2023-04-02 DIAGNOSIS — H43813 Vitreous degeneration, bilateral: Secondary | ICD-10-CM | POA: Diagnosis not present

## 2023-04-02 DIAGNOSIS — H40003 Preglaucoma, unspecified, bilateral: Secondary | ICD-10-CM | POA: Diagnosis not present

## 2023-04-02 DIAGNOSIS — H2513 Age-related nuclear cataract, bilateral: Secondary | ICD-10-CM | POA: Diagnosis not present

## 2023-04-02 DIAGNOSIS — H35371 Puckering of macula, right eye: Secondary | ICD-10-CM | POA: Diagnosis not present

## 2023-04-02 NOTE — Telephone Encounter (Signed)
Patient returned call for lab results.  

## 2023-04-03 NOTE — Telephone Encounter (Signed)
Noted  

## 2023-04-03 NOTE — Telephone Encounter (Signed)
LMTCB

## 2023-04-03 NOTE — Telephone Encounter (Signed)
Patient states she is returning our call.  Patient states her computer started working and she was able to see message from Dr. Dale [place] regarding her lab results.  Patient states she does not want to go on cholesterol medication yet.

## 2023-04-07 NOTE — Progress Notes (Unsigned)
[facility] [address] [place] [address] Phone: [phone] Subjective:   Dawn Vargas, am serving as a scribe for Dr. Antoine Primas.  I'm seeing this patient by the request  of:  Dawn Williamson, MD  CC: Left hip pain  [id number]  02/16/2023 Exacerbated secondary to a fall recently.  Discussed icing regimen and home exercises, discussed which activities to do and which ones to avoid.  Discussed with patient traveling given a dose of prednisone as well.  Given also Zanaflex and warned of potential side effects.  Patient will be traveling and driving nearly [phone] miles.  Follow-up with me again in 7 to 8 weeks otherwise.      Update 04/08/2023 Dawn Vargas is a 72 y.o. female coming in with complaint of L hip pain. Patient states injection did help last time, but took about 2 weeks. Pain in the hip remains the same. Overall intensity of pain has greatly decreased. Activity causes pain. Going up steps really hurts.      Past Medical History:  Diagnosis Date   Allergic asthma    Allergic rhinitis    Arthritis    Chronic back pain    s/p surgery for herniated disc 93/89)   Chronic back pain    Complication of anesthesia    Frequent UTI    GERD (gastroesophageal reflux disease)    pt. states no symptoms   Heart murmur    history of   History of bronchitis    History of kidney stones    History of shingles    Hypertension    Hypertension    PONV (postoperative nausea and vomiting)    pt. states history of nausea in 1989   Reactive airway disease    Rotator cuff tear    left shoulder- pt. states healing   Past Surgical History:  Procedure Laterality Date   BACK SURGERY     CHOLECYSTECTOMY  August 25 2012   COLONOSCOPY  2013   ESOPHAGOGASTRODUODENOSCOPY  01/2011   EYE SURGERY     laporoscopy  08/22/1997   VAGINAL HYSTERECTOMY  1999   laparoscopic assisted   Social History   Socioeconomic History   Marital status: Single     Spouse name: Not on file   Number of children: Not on file   Years of education: Not on file   Highest education level: Not on file  Occupational History   Not on file  Tobacco Use   Smoking status: Never   Smokeless tobacco: Never  Substance and Sexual Activity   Alcohol use: Yes    Alcohol/week: 0.0 standard drinks of alcohol    Comment: socially   Drug use: No   Sexual activity: Not on file  Other Topics Concern   Not on file  Social History Narrative   Not on file   Social Determinants of Health   Financial Resource Strain: Not on file  Food Insecurity: Not on file  Transportation Needs: Not on file  Physical Activity: Not on file  Stress: Not on file  Social Connections: Not on file   Allergies  Allergen Reactions   Other Other (See Comments)    Smoke - severe reactive airway disease    Benadryl [Diphenhydramine Hcl] Hives   Oxycodone Other (See Comments)    dizzy   Beef-Derived Products Other (See Comments)    Gi upset   Caladryl [Pramoxine-Calamine] Hives   Clarithromycin Other (See Comments)    Gi upset  Cleocin [Clindamycin Hcl] Other (See Comments)    GI upset    Egg-Derived Products Diarrhea   Hydrochlorothiazide Other (See Comments)    dizzy   Pegademase Bovine Nausea And Vomiting    Gi upset   Soybean-Containing Drug Products Diarrhea   Tomato Other (See Comments)    Small amount is okay, large amount causes diarrhea - Also potatoes   Family History  Problem Relation Age of Onset   Dementia Father    Prostate cancer Father    Heart disease Mother        myocardial infarction, congestive heart failure   Hypertension Mother    Heart disease Brother        angioplasty   Hypertension Brother    Hypertension Sister    Breast cancer Cousin        maternal side-50's   Colon cancer Neg Hx     Current Outpatient Medications (Endocrine & Metabolic):    predniSONE (DELTASONE) 20 MG tablet, Take 1 tablet (20 mg total) by mouth daily with  breakfast.  Current Outpatient Medications (Cardiovascular):    losartan (COZAAR) 25 MG tablet, Take 1 tablet by mouth twice daily   Current Outpatient Medications (Analgesics):    acetaminophen (TYLENOL) 650 MG CR tablet, Take 1,300 mg by mouth at bedtime.   Current Outpatient Medications (Other):    B Complex-C (SUPER B COMPLEX PO), Take 1 tablet by mouth daily.   CRANBERRY PO, Take 1 capsule by mouth daily.   Glucosamine-Chondroitin-MSM-D (TRIPLE FLEX/VITAMIN D3) TABS, Take 1 tablet by mouth daily.   Omega-3 Fatty Acids (FISH OIL PO), Take 1 capsule by mouth daily.   Probiotic Product (PROBIOTIC PEARLS PO), Take 1 capsule by mouth daily.   tiZANidine (ZANAFLEX) 2 MG tablet, Take 1 tablet (2 mg total) by mouth at bedtime.   Turmeric 500 MG CAPS, Take 500 mg by mouth daily.   Reviewed prior external information including notes and imaging from  primary care provider As well as notes that were available from care everywhere and other healthcare systems.  Past medical history, social, surgical and family history all reviewed in electronic medical record.  No pertanent information unless stated regarding to the chief complaint.   Review of Systems:  No headache, visual changes, nausea, vomiting, diarrhea, constipation, dizziness, abdominal pain, skin rash, fevers, chills, night sweats, weight loss, swollen lymph nodes, body aches, joint swelling, chest pain, shortness of breath, mood changes. POSITIVE muscle aches  Objective  Blood pressure (!) 130/98, pulse 78, height 5\' 5"  (1.651 m), weight 178 lb (80.7 kg), last menstrual period 05/21/1998, SpO2 96%.   General: No apparent distress alert and oriented x3 mood and affect normal, dressed appropriately.  HEENT: Pupils equal, extraocular movements intact  Respiratory: Patient's speak in full sentences and does not appear short of breath  Cardiovascular: No lower extremity edema, non tender, no erythema  Patient is severely tender over  the left greater trochanteric area.  Tender to palpation noted.  Negative straight leg test noted.    Procedure: Real-time Ultrasound Guided Injection of left  greater trochanteric bursitis secondary to patient's body habitus Device: GE Logiq Q7  Ultrasound guided injection is preferred based studies that show increased duration, increased effect, greater accuracy, decreased procedural pain, increased response rate, and decreased cost with ultrasound guided versus blind injection.  Verbal informed consent obtained.  Time-out conducted.  Noted no overlying erythema, induration, or other signs of local infection.  Skin prepped in a sterile fashion.  Local anesthesia: Topical  Ethyl chloride.  With sterile technique and under real time ultrasound guidance:  Greater trochanteric area was visualized and patient's bursa was noted. A 22-gauge 3 inch needle was inserted and 4 cc of 0.5% Marcaine and 1 cc of Kenalog 40 mg/dL was injected. Pictures taken Completed without difficulty  Pain immediately resolved suggesting accurate placement of the medication.  Advised to call if fevers/chills, erythema, induration, drainage, or persistent bleeding.  Impression: Technically successful ultrasound guided injection.   Impression and Recommendations:    The above documentation has been reviewed and is accurate and complete Judi Saa, DO

## 2023-04-08 ENCOUNTER — Ambulatory Visit: Payer: Medicare Other | Admitting: Family Medicine

## 2023-04-08 ENCOUNTER — Other Ambulatory Visit: Payer: Self-pay

## 2023-04-08 ENCOUNTER — Encounter: Payer: Self-pay | Admitting: Family Medicine

## 2023-04-08 VITALS — BP 130/98 | HR 78 | Ht 65.0 in | Wt 178.0 lb

## 2023-04-08 DIAGNOSIS — M7062 Trochanteric bursitis, left hip: Secondary | ICD-10-CM

## 2023-04-08 DIAGNOSIS — M25552 Pain in left hip: Secondary | ICD-10-CM

## 2023-04-08 NOTE — Assessment & Plan Note (Signed)
Patient given injection today and tolerated the procedure well, discussed icing regimen and home exercises, which activities to do and which ones to avoid.  Increasing activity slowly.  Discussed which activities would be more beneficial.  Follow-up again in 6 to 8 weeks.  Hopeful that this will make significant improvement.  Discussed differential would include lumbar radiculopathy and would need to consider further evaluation with imaging if it continues.  Also discussed the possibility of formal physical therapy.

## 2023-04-08 NOTE — Patient Instructions (Signed)
Injection in hip today

## 2023-04-15 ENCOUNTER — Ambulatory Visit: Payer: Medicare Other | Admitting: Internal Medicine

## 2023-04-15 ENCOUNTER — Encounter: Payer: Self-pay | Admitting: Internal Medicine

## 2023-04-15 VITALS — BP 132/74 | HR 92 | Temp 97.4°F | Ht 65.0 in | Wt 177.4 lb

## 2023-04-15 DIAGNOSIS — Z0000 Encounter for general adult medical examination without abnormal findings: Secondary | ICD-10-CM | POA: Diagnosis not present

## 2023-04-15 DIAGNOSIS — I10 Essential (primary) hypertension: Secondary | ICD-10-CM

## 2023-04-15 DIAGNOSIS — M7062 Trochanteric bursitis, left hip: Secondary | ICD-10-CM | POA: Diagnosis not present

## 2023-04-15 NOTE — Assessment & Plan Note (Signed)
Physical today 04/15/23.  Mammogram 03/04/22 - Birads I.  Overdue colonoscopy.  Referred to GI. Was scheduled to have colonoscopy.

## 2023-04-15 NOTE — Progress Notes (Signed)
Subjective:    Patient ID: Dawn Vargas, female    DOB: 04/21/1951, 72 y.o.   MRN: [id number]  Patient here for  Chief Complaint  Patient presents with   Annual Exam    HPI Here for a physical exam. Seeing Dr Katrinka Blazing - left hip pain. S/p fall 12/2022 - right elbow/left hip. S/p injection.  Last evaluated 04/08/23.  Is better. Saw GI - recommended colonoscopy. Reports recent trip - had diarrhea.  Bowels are better now. Probiotic. Planning to work with trainer.  No chest pain or sob reported. Blood pressure elevated - now taking losartan 25mg  bid.     Past Medical History:  Diagnosis Date   Allergic asthma    Allergic rhinitis    Arthritis    Chronic back pain    s/p surgery for herniated disc 93/89)   Chronic back pain    Complication of anesthesia    Frequent UTI    GERD (gastroesophageal reflux disease)    pt. states no symptoms   Heart murmur    history of   History of bronchitis    History of kidney stones    History of shingles    Hypertension    Hypertension    PONV (postoperative nausea and vomiting)    pt. states history of nausea in 1989   Reactive airway disease    Rotator cuff tear    left shoulder- pt. states healing   Past Surgical History:  Procedure Laterality Date   BACK SURGERY     CHOLECYSTECTOMY  August 25 2012   COLONOSCOPY  2013   ESOPHAGOGASTRODUODENOSCOPY  01/2011   EYE SURGERY     laporoscopy  08/22/1997   VAGINAL HYSTERECTOMY  1999   laparoscopic assisted   Family History  Problem Relation Age of Onset   Dementia Father    Prostate cancer Father    Heart disease Mother        myocardial infarction, congestive heart failure   Hypertension Mother    Heart disease Brother        angioplasty   Hypertension Brother    Hypertension Sister    Breast cancer Cousin        maternal side-50's   Colon cancer Neg Hx    Social History   Socioeconomic History   Marital status: Single    Spouse name: Not on file   Number of children: Not on  file   Years of education: Not on file   Highest education level: Not on file  Occupational History   Not on file  Tobacco Use   Smoking status: Never   Smokeless tobacco: Never  Substance and Sexual Activity   Alcohol use: Yes    Alcohol/week: 0.0 standard drinks of alcohol    Comment: socially   Drug use: No   Sexual activity: Not on file  Other Topics Concern   Not on file  Social History Narrative   Not on file   Social Determinants of Health   Financial Resource Strain: Not on file  Food Insecurity: Not on file  Transportation Needs: Not on file  Physical Activity: Not on file  Stress: Not on file  Social Connections: Not on file     Review of Systems  Constitutional:  Negative for appetite change and unexpected weight change.  HENT:  Negative for congestion, sinus pressure and sore throat.   Eyes:  Negative for pain and visual disturbance.  Respiratory:  Negative for cough, chest tightness and shortness  of breath.   Cardiovascular:  Negative for chest pain, palpitations and leg swelling.  Gastrointestinal:  Negative for abdominal pain, diarrhea, nausea and vomiting.  Genitourinary:  Negative for difficulty urinating, dysuria and frequency.  Musculoskeletal:  Negative for joint swelling and myalgias.  Skin:  Negative for color change and rash.  Neurological:  Negative for dizziness and headaches.  Hematological:  Negative for adenopathy. Does not bruise/bleed easily.  Psychiatric/Behavioral:  Negative for agitation and dysphoric mood.        Objective:     BP 132/74   Pulse 92   Temp (!) 97.4 F (36.3 C)   Ht 5\' 5"  (1.651 m)   Wt 177 lb 6.4 oz (80.5 kg)   LMP 05/21/1998   SpO2 97%   BMI 29.52 kg/m  Wt Readings from Last 3 Encounters:  04/15/23 177 lb 6.4 oz (80.5 kg)  04/08/23 178 lb (80.7 kg)  02/16/23 180 lb (81.6 kg)    Physical Exam Vitals reviewed.  Constitutional:      General: She is not in acute distress.    Appearance: Normal  appearance. She is well-developed.  HENT:     Head: Normocephalic and atraumatic.     Right Ear: External ear normal.     Left Ear: External ear normal.  Eyes:     General: No scleral icterus.       Right eye: No discharge.        Left eye: No discharge.     Conjunctiva/sclera: Conjunctivae normal.  Neck:     Thyroid: No thyromegaly.  Cardiovascular:     Rate and Rhythm: Normal rate and regular rhythm.  Pulmonary:     Effort: No tachypnea, accessory muscle usage or respiratory distress.     Breath sounds: Normal breath sounds. No decreased breath sounds or wheezing.  Chest:  Breasts:    Right: No inverted nipple, mass, nipple discharge or tenderness (no axillary adenopathy).     Left: No inverted nipple, mass, nipple discharge or tenderness (no axilarry adenopathy).  Abdominal:     General: Bowel sounds are normal.     Palpations: Abdomen is soft.     Tenderness: There is no abdominal tenderness.  Musculoskeletal:        General: No swelling or tenderness.     Cervical back: Neck supple.  Lymphadenopathy:     Cervical: No cervical adenopathy.  Skin:    Findings: No erythema or rash.  Neurological:     Mental Status: She is alert and oriented to person, place, and time.  Psychiatric:        Mood and Affect: Mood normal.        Behavior: Behavior normal.      Outpatient Encounter Medications as of 04/15/2023  Medication Sig   acetaminophen (TYLENOL) 650 MG CR tablet Take 1,300 mg by mouth at bedtime.   B Complex-C (SUPER B COMPLEX PO) Take 1 tablet by mouth daily.   CRANBERRY PO Take 1 capsule by mouth daily.   Glucosamine-Chondroitin-MSM-D (TRIPLE FLEX/VITAMIN D3) TABS Take 1 tablet by mouth daily.   losartan (COZAAR) 25 MG tablet Take 1 tablet by mouth twice daily   Omega-3 Fatty Acids (FISH OIL PO) Take 1 capsule by mouth daily.   predniSONE (DELTASONE) 20 MG tablet Take 1 tablet (20 mg total) by mouth daily with breakfast.   Probiotic Product (PROBIOTIC PEARLS PO)  Take 1 capsule by mouth daily.   tiZANidine (ZANAFLEX) 2 MG tablet Take 1 tablet (2 mg total) by  mouth at bedtime.   Turmeric 500 MG CAPS Take 500 mg by mouth daily.   No facility-administered encounter medications on file as of 04/15/2023.     Lab Results  Component Value Date   WBC 8.0 04/14/2022   HGB 14.6 04/14/2022   HCT 43.0 04/14/2022   PLT 220 04/14/2022   GLUCOSE 90 03/18/2023   CHOL 156 03/18/2023   TRIG 55.0 03/18/2023   HDL 71.40 03/18/2023   LDLCALC 73 03/18/2023   ALT 14 03/18/2023   AST 15 03/18/2023   NA 139 03/18/2023   K 3.9 03/18/2023   CL 102 03/18/2023   CREATININE 0.60 03/18/2023   BUN 12 03/18/2023   CO2 31 03/18/2023   TSH 2.31 07/29/2022   INR 0.98 02/22/2015   HGBA1C 5.5 11/10/2016    CT Renal Stone Study  Result Date: 04/14/2022 CLINICAL DATA:  Flank pain, kidney stone suspected Patient reports bilateral low back pain. Pain with urination. History of kidney stones. EXAM: CT ABDOMEN AND PELVIS WITHOUT CONTRAST TECHNIQUE: Multidetector CT imaging of the abdomen and pelvis was performed following the standard protocol without IV contrast. RADIATION DOSE REDUCTION: This exam was performed according to the departmental dose-optimization program which includes automated exposure control, adjustment of the mA and/or kV according to patient size and/or use of iterative reconstruction technique. COMPARISON:  CT 07/12/2013 FINDINGS: Lower chest: Subsegmental linear atelectasis or scar in the lingula and both lower lobes. 4 mm left lower lobe nodule series 4, image 16. This is stable from 03/10/2012 chest CT and considered benign, needing no further follow-up. No acute basilar airspace disease or pleural effusion. Hepatobiliary: Liver is prominent size spanning 17.9 cm cranial caudal. No focal hepatic abnormality on this unenhanced exam. Clips in the gallbladder fossa postcholecystectomy. No biliary dilatation. Pancreas: No ductal dilatation or inflammation. Spleen:  Normal in size without focal abnormality. Adrenals/Urinary Tract: No adrenal nodule. No hydronephrosis. No perinephric edema. No renal calculi. No evidence of focal renal abnormality. Decompressed ureters without ureteral stone. The urinary bladder is partially distended. No bladder wall thickening. No bladder stone. Stomach/Bowel: Unremarkable appearance of the stomach. No bowel obstruction or inflammation. Normal appendix. Small volume of colonic stool. Minor sigmoid colonic diverticulosis, no diverticulitis. Mild colonic redundancy. Vascular/Lymphatic: Aortic atherosclerosis and tortuosity. No aneurysm. No bulky adenopathy. Reproductive: Status post hysterectomy. No adnexal masses. Other: No free air, free fluid, or intra-abdominal fluid collection. 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 L5-S1 facet hypertrophy. There are no acute or suspicious osseous abnormalities. IMPRESSION: 1. No renal stones or obstructive uropathy. No acute abnormality in the abdomen/pelvis. 2. Minor sigmoid colonic diverticulosis without diverticulitis. Aortic Atherosclerosis (ICD10-I70.0). Electronically Signed   By: Narda Rutherford M.D.   On: 04/14/2022 22:01       Assessment & Plan:  Routine general medical examination at a health care facility  Health care maintenance Assessment & Plan: Physical today 04/15/23.  Mammogram 03/04/22 - Birads I.  Overdue colonoscopy.  Referred to GI. Was scheduled to have colonoscopy.    Greater trochanteric bursitis of left hip Assessment & Plan: Seeing Dr Katrinka Blazing - left hip pain. S/p fall 12/2022 - right elbow/left hip. S/p injection.  Last evaluated 04/08/23.  Is better.   Primary hypertension Assessment & Plan: Blood pressure elevated recently per her report.  Taking losartan 25mg  bid.  Pressure as outlined.  Hold on additional medication.  Follow pressures.  Follow metabolic panel.       Dale Prague, MD

## 2023-04-19 NOTE — Assessment & Plan Note (Signed)
Blood pressure elevated recently per her report.  Taking losartan 25mg  bid.  Pressure as outlined.  Hold on additional medication.  Follow pressures.  Follow metabolic panel.

## 2023-04-19 NOTE — Assessment & Plan Note (Signed)
Seeing Dr Katrinka Blazing - left hip pain. S/p fall 12/2022 - right elbow/left hip. S/p injection.  Last evaluated 04/08/23.  Is better.

## 2023-06-30 ENCOUNTER — Other Ambulatory Visit: Payer: Medicare Other

## 2023-07-02 ENCOUNTER — Ambulatory Visit: Payer: Medicare Other | Admitting: Internal Medicine

## 2023-07-08 DIAGNOSIS — K080 Exfoliation of teeth due to systemic causes: Secondary | ICD-10-CM | POA: Diagnosis not present

## 2023-07-20 NOTE — Progress Notes (Signed)
[name] [address] [place] [address] Phone: [phone] Subjective:   [name] am a scribe for Dr. Katrinka Blazing.   I'm seeing this patient by the request  of:  Dale Roslyn Harbor, MD  CC: left hip painf/u  [id number]  04/08/2023 Patient given injection today and tolerated the procedure well, discussed icing regimen and home exercises, which activities to do and which ones to avoid. Increasing activity slowly. Discussed which activities would be more beneficial. Follow-up again in 6 to 8 weeks. Hopeful that this will make significant improvement. Discussed differential would include lumbar radiculopathy and would need to consider further evaluation with imaging if it continues. Also discussed the possibility of formal physical therapy.   Updated 07/22/2023 Dawn Vargas is a 73 y.o. female coming in with complaint of L hip pain, found to have GT pain. Patient states left side of leg has gotten better but the hip is still an issue.        Past Medical History:  Diagnosis Date   Allergic asthma    Allergic rhinitis    Arthritis    Chronic back pain    s/p surgery for herniated disc 93/89)   Chronic back pain    Complication of anesthesia    Frequent UTI    GERD (gastroesophageal reflux disease)    pt. states no symptoms   Heart murmur    history of   History of bronchitis    History of kidney stones    History of shingles    Hypertension    Hypertension    PONV (postoperative nausea and vomiting)    pt. states history of nausea in 1989   Reactive airway disease    Rotator cuff tear    left shoulder- pt. states healing   Past Surgical History:  Procedure Laterality Date   BACK SURGERY     CHOLECYSTECTOMY  August 25 2012   COLONOSCOPY  2013   ESOPHAGOGASTRODUODENOSCOPY  01/2011   EYE SURGERY     laporoscopy  08/22/1997   VAGINAL HYSTERECTOMY  1999   laparoscopic assisted   Social History   Socioeconomic History    Marital status: Single    Spouse name: Not on file   Number of children: Not on file   Years of education: Not on file   Highest education level: Not on file  Occupational History   Not on file  Tobacco Use   Smoking status: Never   Smokeless tobacco: Never  Substance and Sexual Activity   Alcohol use: Yes    Alcohol/week: 0.0 standard drinks of alcohol    Comment: socially   Drug use: No   Sexual activity: Not on file  Other Topics Concern   Not on file  Social History Narrative   Not on file   Social Drivers of Health   Financial Resource Strain: Not on file  Food Insecurity: Not on file  Transportation Needs: Not on file  Physical Activity: Not on file  Stress: Not on file  Social Connections: Not on file   Allergies  Allergen Reactions   Other Other (See Comments)    Smoke - severe reactive airway disease    Benadryl [Diphenhydramine Hcl] Hives   Oxycodone Other (See Comments)    dizzy   Beef-Derived Drug Products Other (See Comments)    Gi upset   Caladryl [Pramoxine-Calamine] Hives   Clarithromycin Other (See Comments)    Gi upset   Cleocin [Clindamycin Hcl] Other (See  Comments)    GI upset    Egg-Derived Products Diarrhea   Hydrochlorothiazide Other (See Comments)    dizzy   Pegademase Bovine Nausea And Vomiting    Gi upset   Soybean-Containing Drug Products Diarrhea   Tomato Other (See Comments)    Small amount is okay, large amount causes diarrhea - Also potatoes   Family History  Problem Relation Age of Onset   Dementia Father    Prostate cancer Father    Heart disease Mother        myocardial infarction, congestive heart failure   Hypertension Mother    Heart disease Brother        angioplasty   Hypertension Brother    Hypertension Sister    Breast cancer Cousin        maternal side-50's   Colon cancer Neg Hx     Current Outpatient Medications (Endocrine & Metabolic):    predniSONE (DELTASONE) 20 MG tablet, Take 1 tablet (20 mg total)  by mouth daily with breakfast.  Current Outpatient Medications (Cardiovascular):    losartan (COZAAR) 25 MG tablet, Take 1 tablet by mouth twice daily   Current Outpatient Medications (Analgesics):    acetaminophen (TYLENOL) 650 MG CR tablet, Take 1,300 mg by mouth at bedtime.   Current Outpatient Medications (Other):    B Complex-C (SUPER B COMPLEX PO), Take 1 tablet by mouth daily.   CRANBERRY PO, Take 1 capsule by mouth daily.   Glucosamine-Chondroitin-MSM-D (TRIPLE FLEX/VITAMIN D3) TABS, Take 1 tablet by mouth daily.   Omega-3 Fatty Acids (FISH OIL PO), Take 1 capsule by mouth daily.   Probiotic Product (PROBIOTIC PEARLS PO), Take 1 capsule by mouth daily.   tiZANidine (ZANAFLEX) 2 MG tablet, Take 1 tablet (2 mg total) by mouth at bedtime.   Turmeric 500 MG CAPS, Take 500 mg by mouth daily.   Reviewed prior external information including notes and imaging from  primary care provider As well as notes that were available from care everywhere and other healthcare systems.  Past medical history, social, surgical and family history all reviewed in electronic medical record.  No pertanent information unless stated regarding to the chief complaint.   Review of Systems:  No headache, visual changes, nausea, vomiting, diarrhea, constipation, dizziness, abdominal pain, skin rash, fevers, chills, night sweats, weight loss, swollen lymph nodes, body aches, joint swelling, chest pain, shortness of breath, mood changes. POSITIVE muscle aches  Objective  Blood pressure (!) 170/100, pulse 73, height 5\' 5"  (1.651 m), weight 179 lb (81.2 kg), last menstrual period 05/21/1998, SpO2 96%.   General: No apparent distress alert and oriented x3 mood and affect normal, dressed appropriately.  HEENT: Pupils equal, extraocular movements intact  Respiratory: Patient's speak in full sentences and does not appear short of breath  Cardiovascular: No lower extremity edema, non tender, no erythema  Left hip  shows patient is tender to palpation in the groin area itself.  More tenderness over the piriformis itself.  Patient does have a positive FABER test.  Difficulty going from a seated to standing position. Pain with hip flexion as well.  Limited muscular skeletal ultrasound was performed and interpreted by Antoine Primas, M  Limited ultrasound is noted to have some calcific changes noted in the hip joint itself.  Patient could be potentially a calcific labral tear.  Significant hypoechoic changes noted in the piriformis tendon sheath.   Procedure: Real-time Ultrasound Guided Injection of left piriformis tendon sheath Device: GE Logiq Q7 Ultrasound guided injection is  preferred based studies that show increased duration, increased effect, greater accuracy, decreased procedural pain, increased response rate, and decreased cost with ultrasound guided versus blind injection.  Verbal informed consent obtained.  Time-out conducted.  Noted no overlying erythema, induration, or other signs of local infection.  Skin prepped in a sterile fashion.  Local anesthesia: Topical Ethyl chloride.  With sterile technique and under real time ultrasound guidance: With a 21-gauge 2 inch needle injected into the tendon sheath with a total of 1 cc of 0.5% Marcaine and as well as 1 cc of Kenalog 40 mg/mL. Completed without difficulty  Pain immediately resolved suggesting accurate placement of the medication.  Images saved Advised to call if fevers/chills, erythema, induration, drainage, or persistent bleeding.  Impression: Technically successful ultrasound guided injection.     Impression and Recommendations:     The above documentation has been reviewed and is accurate and complete Judi Saa, DO

## 2023-07-22 ENCOUNTER — Encounter: Payer: Self-pay | Admitting: Family Medicine

## 2023-07-22 ENCOUNTER — Ambulatory Visit (INDEPENDENT_AMBULATORY_CARE_PROVIDER_SITE_OTHER): Payer: Medicare Other

## 2023-07-22 ENCOUNTER — Ambulatory Visit: Payer: Medicare Other | Admitting: Family Medicine

## 2023-07-22 ENCOUNTER — Other Ambulatory Visit: Payer: Self-pay

## 2023-07-22 VITALS — BP 170/100 | HR 73 | Ht 65.0 in | Wt 179.0 lb

## 2023-07-22 DIAGNOSIS — M25552 Pain in left hip: Secondary | ICD-10-CM

## 2023-07-22 DIAGNOSIS — G5702 Lesion of sciatic nerve, left lower limb: Secondary | ICD-10-CM | POA: Insufficient documentation

## 2023-07-22 DIAGNOSIS — M160 Bilateral primary osteoarthritis of hip: Secondary | ICD-10-CM | POA: Diagnosis not present

## 2023-07-22 NOTE — Assessment & Plan Note (Signed)
Patient given injection and tolerated the procedure well, discussed icing regimen and home exercises.  Discussed avoiding certain activities.  Follow-up again in 6 to 8 weeks otherwise. Due to the patient having a calcific change in the hip on ultrasound I would like to get a repeat x-rays and if not having improvement I am concerned that patient may have calcific changes noted.

## 2023-07-22 NOTE — Patient Instructions (Addendum)
Xray for L hip Write me in 2 weeks if not better, we will get MRI See me again in 2-3 months just in case

## 2023-08-03 ENCOUNTER — Encounter: Payer: Self-pay | Admitting: Family Medicine

## 2023-09-06 ENCOUNTER — Other Ambulatory Visit: Payer: Self-pay | Admitting: Internal Medicine

## 2023-10-07 DIAGNOSIS — K080 Exfoliation of teeth due to systemic causes: Secondary | ICD-10-CM | POA: Diagnosis not present

## 2023-10-09 NOTE — Progress Notes (Unsigned)
 [facility] [address] [place] [address] Phone: [phone] Subjective:   Dawn Vargas, am serving as a scribe for Dr. Ronnell Coins.  I'm seeing this patient by the request  of:  Dellar Fenton, MD  CC: Left hip pain  [id number]  07/22/2023 Patient given injection and tolerated the procedure well, discussed icing regimen and home exercises.  Discussed avoiding certain activities.  Follow-up again in 6 to 8 weeks otherwise. Due to the patient having a calcific change in the hip on ultrasound I would like to get a repeat x-rays and if not having improvement I am concerned that patient may have calcific changes noted.  Updated 10/12/2023 Dawn Vargas is a 73 y.o. female coming in with complaint of L hip pain. Injection helped after a while. Had no pain until March 1st to April 9th. Feels like she's almost back to original pain       Past Medical History:  Diagnosis Date   Allergic asthma    Allergic rhinitis    Arthritis    Chronic back pain    s/p surgery for herniated disc 93/89)   Chronic back pain    Complication of anesthesia    Frequent UTI    GERD (gastroesophageal reflux disease)    pt. states no symptoms   Heart murmur    history of   History of bronchitis    History of kidney stones    History of shingles    Hypertension    Hypertension    PONV (postoperative nausea and vomiting)    pt. states history of nausea in 1989   Reactive airway disease    Rotator cuff tear    left shoulder- pt. states healing   Past Surgical History:  Procedure Laterality Date   BACK SURGERY     CHOLECYSTECTOMY  August 25 2012   COLONOSCOPY  2013   ESOPHAGOGASTRODUODENOSCOPY  01/2011   EYE SURGERY     laporoscopy  08/22/1997   VAGINAL HYSTERECTOMY  1999   laparoscopic assisted   Social History   Socioeconomic History   Marital status: Single    Spouse name: Not on file   Number of children: Not on file   Years of education:  Not on file   Highest education level: Not on file  Occupational History   Not on file  Tobacco Use   Smoking status: Never   Smokeless tobacco: Never  Substance and Sexual Activity   Alcohol use: Yes    Alcohol/week: 0.0 standard drinks of alcohol    Comment: socially   Drug use: No   Sexual activity: Not on file  Other Topics Concern   Not on file  Social History Narrative   Not on file   Social Drivers of Health   Financial Resource Strain: Not on file  Food Insecurity: Not on file  Transportation Needs: Not on file  Physical Activity: Not on file  Stress: Not on file  Social Connections: Not on file   Allergies  Allergen Reactions   Other Other (See Comments)    Smoke - severe reactive airway disease    Benadryl [Diphenhydramine Hcl] Hives   Oxycodone Other (See Comments)    dizzy   Beef-Derived Drug Products Other (See Comments)    Gi upset   Caladryl [Pramoxine-Calamine] Hives   Clarithromycin Other (See Comments)    Gi upset   Cleocin [Clindamycin Hcl] Other (See Comments)    GI upset  Egg-Derived Products Diarrhea   Hydrochlorothiazide Other (See Comments)    dizzy   Pegademase Bovine Nausea And Vomiting    Gi upset   Soybean-Containing Drug Products Diarrhea   Tomato Other (See Comments)    Small amount is okay, large amount causes diarrhea - Also potatoes   Family History  Problem Relation Age of Onset   Dementia Father    Prostate cancer Father    Heart disease Mother        myocardial infarction, congestive heart failure   Hypertension Mother    Heart disease Brother        angioplasty   Hypertension Brother    Hypertension Sister    Breast cancer Cousin        maternal side-50's   Colon cancer Neg Hx     Current Outpatient Medications (Endocrine & Metabolic):    predniSONE  (DELTASONE ) 20 MG tablet, Take 1 tablet (20 mg total) by mouth daily with breakfast.  Current Outpatient Medications (Cardiovascular):    losartan  (COZAAR ) 25 MG  tablet, Take 1 tablet by mouth twice daily   Current Outpatient Medications (Analgesics):    acetaminophen (TYLENOL) 650 MG CR tablet, Take 1,300 mg by mouth at bedtime.   Current Outpatient Medications (Other):    B Complex-C (SUPER B COMPLEX PO), Take 1 tablet by mouth daily.   CRANBERRY PO, Take 1 capsule by mouth daily.   Glucosamine-Chondroitin-MSM-D (TRIPLE FLEX/VITAMIN D3) TABS, Take 1 tablet by mouth daily.   Omega-3 Fatty Acids (FISH OIL PO), Take 1 capsule by mouth daily.   Probiotic Product (PROBIOTIC PEARLS PO), Take 1 capsule by mouth daily.   tiZANidine  (ZANAFLEX ) 2 MG tablet, Take 1 tablet (2 mg total) by mouth at bedtime.   Turmeric 500 MG CAPS, Take 500 mg by mouth daily.   Reviewed prior external information including notes and imaging from  primary care provider As well as notes that were available from care everywhere and other healthcare systems.  Past medical history, social, surgical and family history all reviewed in electronic medical record.  No pertanent information unless stated regarding to the chief complaint.   Review of Systems:  No headache, visual changes, nausea, vomiting, diarrhea, constipation, dizziness, abdominal pain, skin rash, fevers, chills, night sweats, weight loss, swollen lymph nodes, body aches, joint swelling, chest pain, shortness of breath, mood changes. POSITIVE muscle aches  Objective  Pulse 81, height 5\' 5"  (1.651 m), weight 181 lb (82.1 kg), last menstrual period 05/21/1998, SpO2 98%.   General: No apparent distress alert and oriented x3 mood and affect normal, dressed appropriately.  HEENT: Pupils equal, extraocular movements intact  Respiratory: Patient's speak in full sentences and does not appear short of breath  Cardiovascular: No lower extremity edema, non tender, no erythema  , Left hip does have a positive FABER test noted.  Negative straight leg test.  Severe tenderness in the piriformis.  Procedure: Real-time  Ultrasound Guided Injection of left piriformis tendon sheath Device: GE Logiq Q7 Ultrasound guided injection is preferred based studies that show increased duration, increased effect, greater accuracy, decreased procedural pain, increased response rate, and decreased cost with ultrasound guided versus blind injection.  Verbal informed consent obtained.  Time-out conducted.  Noted no overlying erythema, induration, or other signs of local infection.  Skin prepped in a sterile fashion.  Local anesthesia: Topical Ethyl chloride.  With sterile technique and under real time ultrasound guidance: With a 21-gauge 2 inch needle injected with 0.5 cc of 0.5% Marcaine and 1  cc of Kenalog  40 mg/mL. Completed without difficulty  Pain immediately improved suggesting accurate placement of the medication.  Images saved Advised to call if fevers/chills, erythema, induration, drainage, or persistent bleeding.  Impression: Technically successful ultrasound guided injection.    Impression and Recommendations:    The above documentation has been reviewed and is accurate and complete Dawn Vargas M Yvanna Vidas, DO

## 2023-10-12 ENCOUNTER — Ambulatory Visit: Payer: Medicare Other | Admitting: Family Medicine

## 2023-10-12 ENCOUNTER — Other Ambulatory Visit: Payer: Self-pay

## 2023-10-12 ENCOUNTER — Encounter: Payer: Self-pay | Admitting: Family Medicine

## 2023-10-12 VITALS — HR 81 | Ht 65.0 in | Wt 181.0 lb

## 2023-10-12 DIAGNOSIS — G5702 Lesion of sciatic nerve, left lower limb: Secondary | ICD-10-CM

## 2023-10-12 DIAGNOSIS — M25552 Pain in left hip: Secondary | ICD-10-CM

## 2023-10-12 NOTE — Patient Instructions (Signed)
 Injection today Read on PRP See you again in 10 weeks

## 2023-10-21 NOTE — Assessment & Plan Note (Signed)
 Chronic problem with exacerbation again.  Has responded well to injections previously.  We discussed differential includes lumbar radiculopathy that needs to be monitored.  Discussed icing regimen of home exercises, discussed which activities to do in which ones to avoid.  Increase activity slowly otherwise.  Follow-up again in 3 months

## 2023-10-26 ENCOUNTER — Telehealth: Payer: Self-pay | Admitting: Family Medicine

## 2023-10-26 NOTE — Telephone Encounter (Signed)
 Patient states the injection she received did not work for her hip and that her pain is still an 8-9. She was wondering what she should do about it now. She would also like to say on Feb. 12th Dr. Felipe Horton suggested getting an MRI and she did not know if he wanted her to do that and he also gave instruction for prp and she wondered if she should she do mri before prp. In the meantime she wants to know if she should take prednisone  for the pain. Next appt is July 12 and she says she can not go on like this with the pain, she is worried about falling bc it weakens her left leg. Aaron Aas

## 2023-10-28 ENCOUNTER — Other Ambulatory Visit: Payer: Self-pay

## 2023-10-28 DIAGNOSIS — M545 Low back pain, unspecified: Secondary | ICD-10-CM

## 2023-10-28 DIAGNOSIS — M25552 Pain in left hip: Secondary | ICD-10-CM

## 2023-10-29 ENCOUNTER — Other Ambulatory Visit: Payer: Self-pay | Admitting: Family Medicine

## 2023-10-30 ENCOUNTER — Other Ambulatory Visit: Payer: Self-pay

## 2023-10-30 MED ORDER — PREDNISONE 20 MG PO TABS: 40.0000 mg | ORAL_TABLET | Freq: Every day | ORAL | 0 refills | Status: DC

## 2023-11-07 ENCOUNTER — Ambulatory Visit
Admission: RE | Admit: 2023-11-07 | Discharge: 2023-11-07 | Disposition: A | Discharge status: Home / Self Care | Source: Ambulatory Visit | Attending: Family Medicine | Admitting: Family Medicine

## 2023-11-07 DIAGNOSIS — M25552 Pain in left hip: Secondary | ICD-10-CM

## 2023-11-07 DIAGNOSIS — M545 Low back pain, unspecified: Secondary | ICD-10-CM

## 2023-11-12 DIAGNOSIS — K080 Exfoliation of teeth due to systemic causes: Secondary | ICD-10-CM | POA: Diagnosis not present

## 2023-11-16 ENCOUNTER — Ambulatory Visit: Payer: Self-pay | Admitting: Family Medicine

## 2023-11-25 ENCOUNTER — Other Ambulatory Visit: Payer: Self-pay | Admitting: Family Medicine

## 2023-11-30 NOTE — Progress Notes (Addendum)
 [name] [address] [place] [address] Phone: [phone] Subjective:   LILLETTE Berwyn Posey, am serving as a scribe for Dr. Arthea Claudene.LILLETTE Berwyn Posey, am serving as a scribe for Dr. Arthea Claudene. I'm seeing this patient by the request  of:  Glendia Shad, MD  CC: Right sided recent injury.  YEP:Dlagzrupcz  CANDELA KRUL is a 73 y.o. female coming in with complaint of L hip and lumbar spine pain. Injection in L hip last visit.  Patient did have the following imaging but since then did fall and hurt her right side.  Tripped over ground and twisted R knee and landed on R side on the 7th. Pain is excruciating behind R knee and down into R calf. Unable to sleep without pain. Patient states that her L hip pain hs improved. Tried to wear hinged knee brace but it was painful due to placing pressure in back of knee.   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 No significant foraminal or spinal stenosis. No acute abnormality.  MRI L hip 11/07/2023 IMPRESSION: Moderate insertional tendinosis of the gluteus medius and minimus tendons with partial tear of the gluteus minimus. No full-thickness tear or bursal collection is present.   Mild degenerative changes of the left hip with joint space loss and mildosteophyte formation. There is a small reactive joint effusion. No accelerated arthrosis.   Likely degenerative changes in a possible intrasubstance degenerative tear of the labrum. No displaced labral tear is present.     Past Medical History:  Diagnosis Date   Allergic asthma    Allergic rhinitis    Arthritis    Chronic back pain    s/p surgery for herniated disc 93/89)   Chronic back pain    Complication of anesthesia    Frequent UTI    GERD (gastroesophageal reflux disease)    pt. states no symptoms   Heart murmur    history of   History of bronchitis    History  of kidney stones    History of shingles    Hypertension    Hypertension    PONV (postoperative nausea and vomiting)    pt. states history of nausea in 1989   Reactive airway disease    Rotator cuff tear    left shoulder- pt. states healing   Past Surgical History:  Procedure Laterality Date   BACK SURGERY     CHOLECYSTECTOMY  August 25 2012   COLONOSCOPY  2013   ESOPHAGOGASTRODUODENOSCOPY  01/2011   EYE SURGERY     laporoscopy  08/22/1997   VAGINAL HYSTERECTOMY  1999   laparoscopic assisted   Social History   Socioeconomic History   Marital status: Single    Spouse name: Not on file   Number of children: Not on file   Years of education: Not on file   Highest education level: Not on file  Occupational History   Not on file  Tobacco Use   Smoking status: Never   Smokeless tobacco: Never  Substance and Sexual Activity   Alcohol use: Yes    Alcohol/week: 0.0 standard drinks of alcohol    Comment: socially   Drug use: No   Sexual activity: Not on file  Other Topics Concern   Not on file  Social History Narrative   Not on file   Social Drivers of Health   Financial Resource Strain: Not on file  Food Insecurity: Not on  file  Transportation Needs: Not on file  Physical Activity: Not on file  Stress: Not on file  Social Connections: Not on file   Allergies  Allergen Reactions   Other Other (See Comments)    Smoke - severe reactive airway disease    Benadryl [Diphenhydramine Hcl] Hives   Oxycodone Other (See Comments)    dizzy   Beef-Derived Drug Products Other (See Comments)    Gi upset   Caladryl [Pramoxine-Calamine] Hives   Clarithromycin Other (See Comments)    Gi upset   Cleocin [Clindamycin Hcl] Other (See Comments)    GI upset    Egg-Derived Products Diarrhea   Hydrochlorothiazide Other (See Comments)    dizzy   Pegademase Bovine Nausea And Vomiting    Gi upset   Soybean-Containing Drug Products Diarrhea   Tomato Other (See Comments)    Small  amount is okay, large amount causes diarrhea - Also potatoes   Family History  Problem Relation Age of Onset   Dementia Father    Prostate cancer Father    Heart disease Mother        myocardial infarction, congestive heart failure   Hypertension Mother    Heart disease Brother        angioplasty   Hypertension Brother    Hypertension Sister    Breast cancer Cousin        maternal side-50's   Colon cancer Neg Hx     Current Outpatient Medications (Endocrine & Metabolic):    predniSONE  (DELTASONE ) 20 MG tablet, Take 2 tablets (40 mg total) by mouth daily with breakfast.  Current Outpatient Medications (Cardiovascular):    losartan  (COZAAR ) 25 MG tablet, Take 1 tablet by mouth twice daily   Current Outpatient Medications (Analgesics):    acetaminophen (TYLENOL) 650 MG CR tablet, Take 1,300 mg by mouth at bedtime.   Current Outpatient Medications (Other):    B Complex-C (SUPER B COMPLEX PO), Take 1 tablet by mouth daily.   CRANBERRY PO, Take 1 capsule by mouth daily.   Glucosamine-Chondroitin-MSM-D (TRIPLE FLEX/VITAMIN D3) TABS, Take 1 tablet by mouth daily.   Omega-3 Fatty Acids (FISH OIL PO), Take 1 capsule by mouth daily.   Probiotic Product (PROBIOTIC PEARLS PO), Take 1 capsule by mouth daily.   tiZANidine  (ZANAFLEX ) 2 MG tablet, Take 1 tablet (2 mg total) by mouth at bedtime.   Turmeric 500 MG CAPS, Take 500 mg by mouth daily.   Reviewed prior external information including notes and imaging from  primary care provider As well as notes that were available from care everywhere and other healthcare systems.  Past medical history, social, surgical and family history all reviewed in electronic medical record.  No pertanent information unless stated regarding to the chief complaint.   Review of Systems:  No headache, visual changes, nausea, vomiting, diarrhea, constipation, dizziness, abdominal pain, skin rash, fevers, chills, night sweats, weight loss, swollen lymph nodes,  body aches, joint swelling, chest pain, shortness of breath, mood changes. POSITIVE muscle aches  Objective  Blood pressure 110/84, pulse 80, height 5' 5 (1.651 m), weight 182 lb (82.6 kg), last menstrual period 05/21/1998, SpO2 98%.   General: No apparent distress alert and oriented x3 mood and affect normal, dressed appropriately.  HEENT: Pupils equal, extraocular movements intact  Respiratory: Patient's speak in full sentences and does not appear short of breath  Cardiovascular: No lower extremity edema, non tender, no erythema  Antalgic gait noted.  Left knee does have a trace effusion noted.  No  crepitus noted.  Mild instability noted with valgus and varus force.  Severe tenderness noted in the calf.   Limited muscular skeletal ultrasound was performed and interpreted by CLAUDENE HUSSAR, M  Limited ultrasound of patient's left true right knee does have some hypoechoic changes noted.  Patient also has what appears to be a potential medial meniscal tear noted.  After informed written and verbal consent, patient was seated on exam table. Right knee was prepped with alcohol swab and utilizing anterolateral approach, patient's right knee space was injected with 4:1  marcaine 0.5%: Kenalog  40mg /dL. Patient tolerated the procedure well without immediate complications.    Impression and Recommendations:    The above documentation has been reviewed and is accurate and complete Kaily Wragg M Miamarie Moll, DO

## 2023-12-01 ENCOUNTER — Telehealth: Payer: Self-pay | Admitting: Family Medicine

## 2023-12-01 NOTE — Telephone Encounter (Signed)
 Patient called and said she has appointment on Thursday and wants to keep it. On the 7th of June she fell and hurt her right side. She started having pain in the back of knee and it is swollen. Two years ago she had a tear in the side and now the pain is in the back of the knee. She has been on ice and heat, put a brace on it, sometimes it Is better but her ankle and back of leg and her knee is swollen. She would like to change appointment thursday to look at right knee and see what happened and not do PRP that day. She states that she is taking Advil but said she is not really supposed to from stomach ulcers in the past but she is taking it with a meal at night so she can get some sleep. FYI for appointment on Thursday.

## 2023-12-03 ENCOUNTER — Ambulatory Visit (HOSPITAL_COMMUNITY)
Admission: RE | Admit: 2023-12-03 | Discharge: 2023-12-03 | Disposition: A | Discharge status: Home / Self Care | Source: Ambulatory Visit | Attending: Family Medicine | Admitting: Family Medicine

## 2023-12-03 ENCOUNTER — Ambulatory Visit: Payer: Self-pay

## 2023-12-03 ENCOUNTER — Ambulatory Visit (INDEPENDENT_AMBULATORY_CARE_PROVIDER_SITE_OTHER)

## 2023-12-03 ENCOUNTER — Ambulatory Visit: Admitting: Family Medicine

## 2023-12-03 ENCOUNTER — Encounter: Payer: Self-pay | Admitting: Family Medicine

## 2023-12-03 VITALS — BP 110/84 | HR 80 | Ht 65.0 in | Wt 182.0 lb

## 2023-12-03 DIAGNOSIS — M79604 Pain in right leg: Secondary | ICD-10-CM | POA: Diagnosis not present

## 2023-12-03 DIAGNOSIS — M25461 Effusion, right knee: Secondary | ICD-10-CM | POA: Diagnosis not present

## 2023-12-03 DIAGNOSIS — M25561 Pain in right knee: Secondary | ICD-10-CM

## 2023-12-03 DIAGNOSIS — M1711 Unilateral primary osteoarthritis, right knee: Secondary | ICD-10-CM | POA: Diagnosis not present

## 2023-12-03 NOTE — Assessment & Plan Note (Signed)
 Patient given injection and tolerated the procedure well, discussed icing regimen of home exercises, increase activity slowly.  Discussed icing regimen.  Follow-up again in 6 to 8 weeks.  Due to the inguinal crease tenderness in the calf we will get an Doppler to make sure there is no type of DVT with patient's recent travel as well.  Discussed icing regimen and home exercises.  Calf injury is also within the differential and given encouragement to wear a compression sleeve and icing regimen.  Follow-up with me again in 6 to 8 weeks

## 2023-12-03 NOTE — Assessment & Plan Note (Signed)
 Patient given injection and tolerated the procedure well, discussed icing regimen and home exercises, discussed which activities to do and which ones to avoid.  Increase activity slowly.  Follow-up again in 6 to 8 weeks otherwise.

## 2023-12-03 NOTE — Patient Instructions (Addendum)
  [facility] [address] [place],  [place]  [address] Main: [phone] 10:30am today 10:15am arrival Injected calf Calf injury Calf compression See me again in 8 weeks

## 2023-12-11 ENCOUNTER — Other Ambulatory Visit: Payer: Self-pay | Admitting: Internal Medicine

## 2023-12-21 ENCOUNTER — Ambulatory Visit: Admitting: Family Medicine

## 2023-12-29 ENCOUNTER — Encounter: Payer: Self-pay | Admitting: Emergency Medicine

## 2023-12-29 ENCOUNTER — Emergency Department
Admission: EM | Admit: 2023-12-29 | Discharge: 2023-12-29 | Disposition: A | Discharge status: Home / Self Care | Attending: Emergency Medicine | Admitting: Emergency Medicine

## 2023-12-29 ENCOUNTER — Other Ambulatory Visit: Payer: Self-pay

## 2023-12-29 ENCOUNTER — Emergency Department

## 2023-12-29 DIAGNOSIS — I10 Essential (primary) hypertension: Secondary | ICD-10-CM | POA: Insufficient documentation

## 2023-12-29 DIAGNOSIS — R519 Headache, unspecified: Secondary | ICD-10-CM | POA: Diagnosis not present

## 2023-12-29 LAB — CBC WITH DIFFERENTIAL/PLATELET
Abs Immature Granulocytes: 0.01 K/uL (ref 0.00–0.07)
Basophils Absolute: 0 K/uL (ref 0.0–0.1)
Basophils Relative: 1 %
Eosinophils Absolute: 0.1 K/uL (ref 0.0–0.5)
Eosinophils Relative: 2 %
HCT: 45.2 % (ref 36.0–46.0)
Hemoglobin: 15.3 g/dL — ABNORMAL HIGH (ref 12.0–15.0)
Immature Granulocytes: 0 %
Lymphocytes Relative: 18 %
Lymphs Abs: 1.3 K/uL (ref 0.7–4.0)
MCH: 30.7 pg (ref 26.0–34.0)
MCHC: 33.8 g/dL (ref 30.0–36.0)
MCV: 90.8 fL (ref 80.0–100.0)
Monocytes Absolute: 0.6 K/uL (ref 0.1–1.0)
Monocytes Relative: 9 %
Neutro Abs: 5.2 K/uL (ref 1.7–7.7)
Neutrophils Relative %: 70 %
Platelets: 213 K/uL (ref 150–400)
RBC: 4.98 MIL/uL (ref 3.87–5.11)
RDW: 13.1 % (ref 11.5–15.5)
WBC: 7.3 K/uL (ref 4.0–10.5)
nRBC: 0 % (ref 0.0–0.2)

## 2023-12-29 LAB — BASIC METABOLIC PANEL WITH GFR
Anion gap: 8 (ref 5–15)
BUN: 12 mg/dL (ref 8–23)
CO2: 26 mmol/L (ref 22–32)
Calcium: 9.6 mg/dL (ref 8.9–10.3)
Chloride: 100 mmol/L (ref 98–111)
Creatinine, Ser: 0.41 mg/dL — ABNORMAL LOW (ref 0.44–1.00)
GFR, Estimated: >60 mL/min (ref >60)
Glucose, Bld: 99 mg/dL (ref 70–99)
Potassium: 3.6 mmol/L (ref 3.5–5.1)
Sodium: 134 mmol/L — ABNORMAL LOW (ref 135–145)

## 2023-12-29 MED ORDER — LABETALOL HCL 5 MG/ML IV SOLN: 10.0000 mg | INTRAVENOUS | Status: DC | PRN

## 2023-12-29 NOTE — ED Triage Notes (Signed)
 Patient to ED via POV for hypertension and pain in head but not a headache. Started pain started around 0300. States lack of appetite. BP at home 165/145. Hx of HTN and has taken meds today.

## 2023-12-29 NOTE — ED Provider Notes (Signed)
 [facility] Provider Note   Event Date/Time   First MD Initiated Contact with Patient 12/29/23 2052     (approximate) History  Hypertension  HPI Dawn Vargas is a 73 y.o. female with a past medical history of hypertension on losartan  and PTSD who presents complaining of hypertension and a headache.  Patient is concerned that she may have burst of blood vessel in her head as she took her blood pressure at home and found it to be 260 systolic.  Patient states that she has been having intermittent headaches over the last few days but has not checked her blood pressure until today.  Patient states she has had ROS: Patient currently denies any vision changes, tinnitus, difficulty speaking, facial droop, sore throat, chest pain, shortness of breath, abdominal pain, nausea/vomiting/diarrhea, dysuria, or weakness/numbness/paresthesias in any extremity   Physical Exam  Triage Vital Signs: ED Triage Vitals  Encounter Vitals Group     BP 12/29/23 1511 (!) 234/114     Girls Systolic BP Percentile --      Girls Diastolic BP Percentile --      Boys Systolic BP Percentile --      Boys Diastolic BP Percentile --      Pulse Rate 12/29/23 1509 (!) 103     Resp 12/29/23 1509 17     Temp 12/29/23 1509 98 F (36.7 C)     Temp Source 12/29/23 1509 Oral     SpO2 12/29/23 1509 100 %     Weight 12/29/23 1509 182 lb (82.6 kg)     Height 12/29/23 1509 5' 5.5 (1.664 m)     Head Circumference --      Peak Flow --      Pain Score 12/29/23 1509 1     Pain Loc --      Pain Education --      Exclude from Growth Chart --    Most recent vital signs: Vitals:   12/29/23 2113 12/29/23 2149  BP: (!) 154/98 (!) 144/88  Pulse: 75 72  Resp: 18 18  Temp:    SpO2: 100% 98%   General: Awake, oriented x4. CV:  Good peripheral perfusion. Resp:  Normal effort. Abd:  No distention. Other:  Elderly overweight Caucasian female resting comfortably in no acute distress ED Results /  Procedures / Treatments  Labs (all labs ordered are listed, but only abnormal results are displayed) Labs Reviewed  CBC WITH DIFFERENTIAL/PLATELET - Abnormal; Notable for the following components:      Result Value   Hemoglobin 15.3 (*)    All other components within normal limits  BASIC METABOLIC PANEL WITH GFR - Abnormal; Notable for the following components:   Sodium 134 (*)    Creatinine, Ser 0.41 (*)    All other components within normal limits   RADIOLOGY ED MD interpretation: CT of the head without contrast interpreted by me shows no evidence of acute abnormalities including no intracerebral hemorrhage, obvious masses, or significant edema - All radiology independently interpreted and agree with radiology assessment Official radiology report(s): No results found. PROCEDURES: Critical Care performed: No Procedures MEDICATIONS ORDERED IN ED: Medications - No data to display IMPRESSION / MDM / ASSESSMENT AND PLAN / ED COURSE  I reviewed the triage vital signs and the nursing notes.                             The patient is on the  cardiac monitor to evaluate for evidence of arrhythmia and/or significant heart rate changes. Patient's presentation is most consistent with acute presentation with potential threat to life or bodily function. Presents to the emergency department complaining of high blood pressure. Patient is otherwise asymptomatic without confusion, chest pain, hematuria, or SOB. Denies nonadherence to antihypertensive regimen DDx: CV, AMI, heart failure, renal infarction or failure or other end organ damage.  Disposition: Discussed with patient their elevated blood pressure and need for close outpatient management of their hypertension. Will provide a prescription for the patient's previous antihypertensive medication and arrange for the patient to follow up in a primary care clinic    FINAL CLINICAL IMPRESSION(S) / ED DIAGNOSES   Final diagnoses:   Hypertension, unspecified type   Rx / DC Orders   ED Discharge Orders     None      Note:  This document was prepared using Dragon voice recognition software and may include unintentional dictation errors.   Zuleica Seith K, MD 01/01/24 [id number]

## 2023-12-29 NOTE — ED Notes (Signed)
 Reviewed D/C information with the patient, pt verbalized understanding. No additional concerns at this time.

## 2023-12-31 ENCOUNTER — Ambulatory Visit: Payer: Self-pay

## 2023-12-31 NOTE — Telephone Encounter (Signed)
 FYI Only or Action Required?: Action required by provider: clinical question for provider.  Patient was last seen in primary care on 04/15/2023 by Glendia Shad, MD.  Called Nurse Triage reporting Hypertension.  Symptoms began several weeks ago. Fluctuating over the past several weeks  Interventions attempted: Prescription medications: Losartan  25mg  BID.  Symptoms are: gradually worsening.  Triage Disposition: See HCP Within 4 Hours (Or PCP Triage) no appts avail. This RN advising ED, patient refused. Will route HP to clinic for f/u  Patient/caregiver understands and will follow disposition?: No, refuses disposition Copied from CRM #[id number]. Topic: Clinical - Red Word Triage >> Dec 31, 2023  3:25 PM Mia F wrote: Red Word that prompted transfer to Nurse Triage: Pt was in the ER on Tuesday because she read her bp at 265/145 with a 114 pulse. She was shaky and she felt like her heart was rasing. She says she has had head pain (not headaches more like pressure) for a few days prior. She said she did not feel fatigue but did not feel strong. She said there was a CT done and CBC and it shows her sodium is low. She said her daughter came by and told her it sounds like she has POTS. She said she drank some Gatorade and it did help her feel a little better. Yesterday she coughed up some mucous and it was green. Today she feels how she did when she went to the er but not as bad. Today's bp is 119/82 with a 56 pulse. She thought she was getting better but after moving around she began to feel bad again and her bp started to rise again. Her bp now is 207/114 with a pulse of 76. Reason for Disposition  [1] Systolic BP >= 200 OR Diastolic >= 120 AND [2] having NO cardiac or neurologic symptoms  Answer Assessment - Initial Assessment Questions 1. BLOOD PRESSURE: What is your blood pressure? Did you take at least two measurements 5 minutes apart?     134/87 (78 hr) earlier today; 207/114 (76), 204/119  (76)  2. ONSET: When did you take your blood pressure?     Checks BP daily  3. HOW: How did you take your blood pressure? (e.g., automatic home BP monitor, visiting nurse)     Home BP monitor  4. HISTORY: Do you have a history of high blood pressure?     Yes  5. MEDICINES: Are you taking any medicines for blood pressure? Have you missed any doses recently?     Losartan  25mg  BID, no missed doses   6. OTHER SYMPTOMS: Do you have any symptoms? (e.g., blurred vision, chest pain, difficulty breathing, headache, weakness)     Feels shaky  7. PREGNANCY: Is there any chance you are pregnant? When was your last menstrual period?     no  Protocols used: Blood Pressure - High-A-AH

## 2024-01-01 DIAGNOSIS — Z91012 Allergy to eggs: Secondary | ICD-10-CM | POA: Diagnosis not present

## 2024-01-01 DIAGNOSIS — Z791 Long term (current) use of non-steroidal anti-inflammatories (NSAID): Secondary | ICD-10-CM | POA: Diagnosis not present

## 2024-01-01 DIAGNOSIS — Z881 Allergy status to other antibiotic agents status: Secondary | ICD-10-CM | POA: Diagnosis not present

## 2024-01-01 DIAGNOSIS — I10 Essential (primary) hypertension: Secondary | ICD-10-CM | POA: Diagnosis not present

## 2024-01-01 DIAGNOSIS — Z79899 Other long term (current) drug therapy: Secondary | ICD-10-CM | POA: Diagnosis not present

## 2024-01-01 DIAGNOSIS — Z888 Allergy status to other drugs, medicaments and biological substances status: Secondary | ICD-10-CM | POA: Diagnosis not present

## 2024-01-01 DIAGNOSIS — E860 Dehydration: Secondary | ICD-10-CM | POA: Diagnosis not present

## 2024-01-01 DIAGNOSIS — Z885 Allergy status to narcotic agent status: Secondary | ICD-10-CM | POA: Diagnosis not present

## 2024-01-01 DIAGNOSIS — Z91018 Allergy to other foods: Secondary | ICD-10-CM | POA: Diagnosis not present

## 2024-01-01 NOTE — ED Triage Notes (Signed)
 Pt coming to ED with complaints of HTN -- she states that she took her BP on Tues and it was 265/145 at home.. she went to OSH for same, was found to be 155/95, nothing was done. She was told by her PCP to come to [facility] for further eval.

## 2024-01-01 NOTE — Telephone Encounter (Signed)
 FYI- Called and discussed with patient. She did not want to go to ED at [facility]. She is going to have a friend drive her to [facility] today.

## 2024-01-01 NOTE — Telephone Encounter (Signed)
 Reviewed. Agree with blood pressure remaining over 200 systolic and having increased heart rate, palpitations, etc - needs to be evaluated.
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
## 2024-01-01 NOTE — ED Provider Notes (Signed)
 [facility] Emergency Department Provider Note    ED Clinical Impression     Diagnosis ICD-10-CM Associated Orders  1. Hypertension, unspecified type  I10         Impression, Medical Decision Making, Progress Notes and Critical Care    Impression, Differential Diagnosis and Plan of Care  73 year old female with hypertension and recent outside hospital emergency room visit for hypertension with elevated blood pressure. Patient asymptomatic on exam with no chest pain or headache at this time.  CBC and CMP at outside hospital were within normal limits, CT head was clear with no acute findings. EKG obtained today with no concerning findings. Recommend that patient follow up with your PCP for continued blood pressure management. Instructed patient to call PCP office on Monday to try to get earlier appointment and to continue taking BP regimen as prescribed. Gave return precautions for severe headache, chest pain, shortness of breath, or changes in neurologic status.     Additional MDM Elements              Portions of this record have been created using Dragon dictation software. Dictation errors have been sought, but may not have been identified and corrected.  See chart and nursing documentation for additional ED course details.  ____________________________________________      History    Reason for Visit High Blood Pressure  HPI  Desani Behring is a 73 y.o. female with PMHx of HTN presented for extreme hypertension with reported BP of 265/145 at home. Was seen at OSH where BP was 234/114 before normalized to 154/98, patient was asymptomatic and discharged with instructions to follow-up with her PCP.  Patient spoke with primary care provider who advised patient to present to the emergency department for systolic blood pressures of over 200 at home. Patient reports previously with her elevated blood pressures she has had hand shaking and some chest pain with heart  palpitations, denies feeling those at this time.  Does report a headache, states it is not painful however she does feel a sensation of pressure.  Had CT head at outside hospital with no acute abnormalities.  Patient was also told that she is dehydrated and agreed that she felt this way at her outside hospital visit, is feeling better today.  Patient is feeling quite anxious about this as her grandmother has passed from stroke and she has a family cardiac history of MI.     Denies any vision changes, nausea, vomiting, chest pain, or shortness of breath.   Outside Historian(s) OSH chart review.   Past Medical History[1]  Past Surgical History[2]  No current facility-administered medications for this encounter.  Current Outpatient Medications:  .  acetaminophen (TYLENOL) 650 MG CR tablet, Take 1,300 mg by mouth., Disp: , Rfl:  .  B-complex with vitamin C (TOTAL B W/C) tablet, Take 1 tablet by mouth., Disp: , Rfl:  .  cranberry extract 200 mg cap, Take by mouth., Disp: , Rfl:  .  d-mannose Powd, Take 500 mg by mouth daily., Disp: 1 g, Rfl: 0 .  Lactobacillus acidophilus (PROBIOTIC ORAL), Take 1 capsule by mouth daily. Femdophilus over the counter, Disp: , Rfl:  .  losartan  (COZAAR ) 25 MG tablet, TAKE ONE TABLET BY MOUTH TWICE DAILY .  NEEDS  OFFICE  VISIT  FOR  FURTHER  REFILLS., Disp: , Rfl:  .  omega-3 fatty acids-fish oil 300-1,000 mg cap, Take 1 capsule by mouth., Disp: , Rfl:   Allergies Diphenhydramine hcl, Other,  Pramoxine-calamine, Oxycodone, Clindamycin hcl, Clarithromycin, Egg derived, Hydrochlorothiazide, Pegademase bovine, Soybean oil, and Tomato  Family History[3]  Short Social History[4]    Physical Exam   ED Triage Vitals  Enc Vitals Group     BP 01/01/24 1422 200/117     Pulse 01/01/24 1412 115     SpO2 Pulse --      Resp 01/01/24 1422 18     Temp 01/01/24 1422 36.8 C (98.2 F)     Temp Source 01/01/24 1422 Oral     SpO2 01/01/24 1412 98 %     Weight 01/01/24  1417 82 kg (180 lb 12.4 oz)    Constitutional: Alert and oriented. Well appearing and in no distress. HEENT: Conjunctivae are normal; NCAT Cardiovascular: Normal rate, regular rhythm.  Respiratory: Normal respiratory effort. Breath sounds are normal. Gastrointestinal: Soft and nontender. Normoactive bowel sounds Musculoskeletal: Normal range of motion in all extremities; patient able to ambulate; no lower extremity edema Neurologic: Normal speech and language. No gross focal neurologic deficits are appreciated. Skin: Skin is warm, dry and intact. No rash noted. Psychiatric: Mood and affect are normal. Speech and behavior are normal.    Radiology   No orders to display   Joesph FORBES Dustman, MD        [1] Past Medical History: Diagnosis Date  . Arthritis   . Back problem    L4-L5  . Diverticulitis    Possible  . GERD (gastroesophageal reflux disease)    Possible  . Hypertension   . Kidney stone   . Nephrolithiasis   . Peptic ulcer   . Urinary tract infection   [2] Past Surgical History: Procedure Laterality Date  . BACK SURGERY    . CHOLECYSTECTOMY    . COLONOSCOPY  04/2012  . HYSTERECTOMY    [3] Family History Problem Relation Age of Onset  . Cancer Father        prostate cancer  . Hypertension Father   . Dementia Father   . Heart disease Mother   . Hypertension Mother   . Hypertension Brother   . Heart disease Brother   . Hypertension Sister   [4] Social History Tobacco Use  . Smoking status: Never  . Smokeless tobacco: Never  Substance Use Topics  . Alcohol use: Yes  . Drug use: No   Schock, Joesph FORBES, MD Resident 01/01/24 [phone]

## 2024-01-05 ENCOUNTER — Ambulatory Visit: Payer: Self-pay

## 2024-01-05 ENCOUNTER — Other Ambulatory Visit: Payer: Self-pay

## 2024-01-05 MED ORDER — AMLODIPINE BESYLATE 5 MG PO TABS: 5.0000 mg | ORAL_TABLET | Freq: Every day | ORAL | 0 refills | Status: DC

## 2024-01-05 NOTE — Telephone Encounter (Signed)
 Called patient. Discussed and advised of below. Confirmed no allergy to amlodipine . Rx sent in for 30 day supply. She is monitoring salt intake and is not taking anything that could increase pressure. She will monitor pressures. Keep appt and bring readings to appt. Advised if Dr Glendia has cancellation this week, will move appt up.

## 2024-01-05 NOTE — Telephone Encounter (Signed)
 Have her rest and recheck her pressure. She is currently on losartan  25mg  bid. Confirm no allergy to amlodipine . If no, then start amlodipine  5mg  q day. If blood pressure remains over 200 or if any change or acute symptoms, will need to be reevaluated.  Avoid increased salt intake. Make sure she is not taking any decongestants or anything else that could increase blood pressure. Hold for work in appt.

## 2024-01-05 NOTE — Telephone Encounter (Signed)
 FYI Only or Action Required?: FYI only for provider.  Patient was last seen in primary care on 04/15/2023 by Glendia Shad, MD.  Called Nurse Triage reporting Hypertension.  Symptoms began today.  Interventions attempted: Nothing.  Symptoms are: unchanged.  Triage Disposition: See PCP Within 2 Weeks  Patient/caregiver understands and will follow disposition?: Yes    Copied from CRM [id number]. Topic: Clinical - Red Word Triage >> Jan 05, 2024  3:40 PM Thersia C wrote: [facility] that prompted transfer to Nurse Triage: Patient called in regarding her having very high blood pressure in the 200s, patient stated she has been to the ER twice and they just keep telling her to be seen by pcp as soon as possible, But Dr.Scott does not have anything until next week at the soonest    ----------------------------------------------------------------------- From previous Reason for Contact - Other: Reason for CRM: Patient called in wanting to speak to Reason for Disposition  [1] Systolic BP >= 130 OR Diastolic >= 80 AND [2] taking BP medications  Answer Assessment - Initial Assessment Questions 1. BLOOD PRESSURE: What is your blood pressure? Did you take at least two measurements 5 minutes apart?     134/75, 149/86 2. ONSET: When did you take your blood pressure?     today 3. HOW: How did you take your blood pressure? (e.g., automatic home BP monitor, visiting nurse)     automatic 4. HISTORY: Do you have a history of high blood pressure?     yes 5. MEDICINES: Are you taking any medicines for blood pressure? Have you missed any doses recently?     denies 6. OTHER SYMPTOMS: Do you have any symptoms? (e.g., blurred vision, chest pain, difficulty breathing, headache, weakness)     Palpitations,  head ache 7. PREGNANCY: Is there any chance you are pregnant? When was your last menstrual period?     na  Protocols used: Blood Pressure - High-A-AH

## 2024-01-08 ENCOUNTER — Emergency Department

## 2024-01-08 ENCOUNTER — Other Ambulatory Visit: Payer: Self-pay

## 2024-01-08 ENCOUNTER — Emergency Department
Admission: EM | Admit: 2024-01-08 | Discharge: 2024-01-08 | Disposition: A | Discharge status: Home / Self Care | Attending: Emergency Medicine | Admitting: Emergency Medicine

## 2024-01-08 DIAGNOSIS — J984 Other disorders of lung: Secondary | ICD-10-CM | POA: Diagnosis not present

## 2024-01-08 DIAGNOSIS — R9431 Abnormal electrocardiogram [ECG] [EKG]: Secondary | ICD-10-CM | POA: Diagnosis not present

## 2024-01-08 DIAGNOSIS — Z79899 Other long term (current) drug therapy: Secondary | ICD-10-CM | POA: Insufficient documentation

## 2024-01-08 DIAGNOSIS — R918 Other nonspecific abnormal finding of lung field: Secondary | ICD-10-CM | POA: Diagnosis not present

## 2024-01-08 DIAGNOSIS — R002 Palpitations: Secondary | ICD-10-CM | POA: Diagnosis not present

## 2024-01-08 DIAGNOSIS — R0789 Other chest pain: Secondary | ICD-10-CM | POA: Diagnosis not present

## 2024-01-08 DIAGNOSIS — R600 Localized edema: Secondary | ICD-10-CM | POA: Diagnosis not present

## 2024-01-08 DIAGNOSIS — R079 Chest pain, unspecified: Secondary | ICD-10-CM | POA: Diagnosis not present

## 2024-01-08 DIAGNOSIS — I10 Essential (primary) hypertension: Secondary | ICD-10-CM | POA: Diagnosis not present

## 2024-01-08 DIAGNOSIS — I700 Atherosclerosis of aorta: Secondary | ICD-10-CM | POA: Diagnosis not present

## 2024-01-08 DIAGNOSIS — Z87442 Personal history of urinary calculi: Secondary | ICD-10-CM | POA: Diagnosis not present

## 2024-01-08 LAB — CBC
HCT: 42.6 % (ref 36.0–46.0)
Hemoglobin: 14.5 g/dL (ref 12.0–15.0)
MCH: 30.7 pg (ref 26.0–34.0)
MCHC: 34 g/dL (ref 30.0–36.0)
MCV: 90.1 fL (ref 80.0–100.0)
Platelets: 220 K/uL (ref 150–400)
RBC: 4.73 MIL/uL (ref 3.87–5.11)
RDW: 12.7 % (ref 11.5–15.5)
WBC: 5.1 K/uL (ref 4.0–10.5)
nRBC: 0 % (ref 0.0–0.2)

## 2024-01-08 LAB — BASIC METABOLIC PANEL WITH GFR
Anion gap: 12 (ref 5–15)
BUN: 14 mg/dL (ref 8–23)
CO2: 25 mmol/L (ref 22–32)
Calcium: 9.4 mg/dL (ref 8.9–10.3)
Chloride: 100 mmol/L (ref 98–111)
Creatinine, Ser: 0.49 mg/dL (ref 0.44–1.00)
GFR, Estimated: >60 mL/min (ref >60)
Glucose, Bld: 107 mg/dL — ABNORMAL HIGH (ref 70–99)
Potassium: 3.7 mmol/L (ref 3.5–5.1)
Sodium: 137 mmol/L (ref 135–145)

## 2024-01-08 LAB — TSH: TSH: 1.006 u[IU]/mL (ref 0.350–4.500)

## 2024-01-08 LAB — D-DIMER, QUANTITATIVE: D-Dimer, Quant: 0.44 ug{FEU}/mL (ref 0.00–0.50)

## 2024-01-08 LAB — T4, FREE: Free T4: 0.97 ng/dL (ref 0.61–1.12)

## 2024-01-08 LAB — TROPONIN I (HIGH SENSITIVITY): Troponin I (High Sensitivity): 5 ng/L (ref <18)

## 2024-01-08 LAB — BRAIN NATRIURETIC PEPTIDE: B Natriuretic Peptide: 22.6 pg/mL (ref 0.0–100.0)

## 2024-01-08 MED ORDER — AMLODIPINE BESYLATE 5 MG PO TABS
5.0000 mg | ORAL_TABLET | Freq: Once | ORAL | Status: DC
  Filled 2024-01-08: qty 1

## 2024-01-08 NOTE — ED Provider Notes (Signed)
 Dawn Vargas Provider Note    Event Date/Time   First MD Initiated Contact with Patient 01/08/24 1053     (approximate)   History   Chest Pain   HPI  Dawn Vargas is a 73 y.o. female with history of hypertension, GERD, nephrolithiasis, presenting with chest pain that started yesterday.  States that she has been having issues with her blood pressures being elevated.  Has been seen at [facility] ED as well as here for similar symptoms.  States that she just got started on amlodipine , is on losartan  at baseline, has not taken the amlodipine  today.  States that she has intermittent left lower chest pain, for the last several days.  Does not have any chest pain right now.  No shortness of breath, no unilateral calf sign or tenderness, no prior cardiac issues.  States that she has been having intermittent palpitations.  Denies history of DVT or cancer, no recent travel or surgeries, not on any blood thinners.  No recent trauma or falls.    On independent chart review, she was seen by [facility] ED for Harvette pressure at the end of July, had no chest pain or headache at that time.  They had recommended follow-up for her blood pressure medication.  Physical Exam   Triage Vital Signs: ED Triage Vitals [01/08/24 1049]  Encounter Vitals Group     BP (!) 180/113     Girls Systolic BP Percentile      Girls Diastolic BP Percentile      Boys Systolic BP Percentile      Boys Diastolic BP Percentile      Pulse Rate (!) 120     Resp 20     Temp 97.8 F (36.6 C)     Temp Source Oral     SpO2 99 %     Weight 181 lb (82.1 kg)     Height 5' 5 (1.651 m)     Head Circumference      Peak Flow      Pain Score 0     Pain Loc      Pain Education      Exclude from Growth Chart     Most recent vital signs: Vitals:   01/08/24 1049  BP: (!) 180/113  Pulse: (!) 120  Resp: 20  Temp: 97.8 F (36.6 C)  SpO2: 99%     General: Awake, no distress.  CV:  Good peripheral  perfusion.  Resp:  Normal effort.  No tachypnea or respiratory distress Abd:  No distention.  Soft nontender Other:  Trace lower extremity edema, no unilateral calf swelling or tenderness   ED Results / Procedures / Treatments   Labs (all labs ordered are listed, but only abnormal results are displayed) Labs Reviewed  BASIC METABOLIC PANEL WITH GFR - Abnormal; Notable for the following components:      Result Value   Glucose, Bld 107 (*)    All other components within normal limits  CBC  D-DIMER, QUANTITATIVE  BRAIN NATRIURETIC PEPTIDE  TSH  T4, FREE  TROPONIN I (HIGH SENSITIVITY)     EKG  EKG shows, sinus rhythm rate of 80, normal QRS, normal QTc, no obvious ischemic ST elevation, T wave flattening in aVL, not significantly changed compared to prior   RADIOLOGY On my independent interpretation, chest x-ray without obvious consolidation   PROCEDURES:  Critical Care performed: No  Procedures   MEDICATIONS ORDERED IN ED: Medications  amLODipine  (NORVASC ) tablet 5  mg (has no administration in time range)     IMPRESSION / MDM / ASSESSMENT AND PLAN / ED COURSE  I reviewed the triage vital signs and the nursing notes.                              Differential diagnosis includes, but is not limited to, angina, ACS, musculoskeletal pain, strain, CHF, anxiety, thyroid  dysfunction, electrolyte derangements, did consider PE, she is tachycardic here but not hypoxic, no shortness of breath at this time, no other risk factors, she is low risk we will get a D-dimer, labs, EKG, troponin, chest x-ray.  Patient's presentation is most consistent with acute presentation with potential threat to life or bodily function.  Independent interpretation of labs and imaging below.  On reassessment patient is asymptomatic, heart rate is 69.  Discussed with her about imaging and lab results, considered but no indication for inpatient admission at this time, she safe for outpatient  management.  Instructed her to follow-up with her primary care doctor on Monday, I put in a referral for cardiology as well.  Otherwise discharge with strict return precautions.  Shared decision making done with patient and she is agreeable with this plan.  The patient is on the cardiac monitor to evaluate for evidence of arrhythmia and/or significant heart rate changes.   Clinical Course as of 01/08/24 1401  Fri Jan 08, 2024  1215 DG Chest 2 View IMPRESSION: There is some linear opacity left left lung base likely scar or atelectasis. Otherwise no acute cardiopulmonary disease.   [TT]  1329 Independent review of labs, troponin BNP TSH and free T4 not elevated, electrolytes not severely deranged, no leukocytosis, D-dimer is not elevated. [TT]  1400 Heart rate 69 in the room.  Patient is asymptomatic at this time. [TT]    Clinical Course User Index [TT] Waymond, Lorelle Cummins, MD     FINAL CLINICAL IMPRESSION(S) / ED DIAGNOSES   Final diagnoses:  Chest pain, unspecified type     Rx / DC Orders   ED Discharge Orders          Ordered    Ambulatory referral to Cardiology       Comments: If you have not heard from the Cardiology office within the next 72 hours please call [phone].   01/08/24 1330             Note:  This document was prepared using Dragon voice recognition software and may include unintentional dictation errors.    Waymond Lorelle Cummins, MD 01/08/24 [phone]

## 2024-01-08 NOTE — ED Triage Notes (Signed)
 Patient states left sided chest pain since yesterday; also having issues with BP. States she has been seen here and [facility] ER for the same. Has an appointment with PCP on Monday.

## 2024-01-08 NOTE — Discharge Instructions (Signed)
 Please make sure to take your medications as prescribed.  Please make sure to follow-up with your primary care doctor on Monday for appointment.  I have also put in the referral for you for cardiology to schedule follow-up.

## 2024-01-11 ENCOUNTER — Encounter: Payer: Self-pay | Admitting: Internal Medicine

## 2024-01-11 ENCOUNTER — Ambulatory Visit (INDEPENDENT_AMBULATORY_CARE_PROVIDER_SITE_OTHER): Admitting: Internal Medicine

## 2024-01-11 ENCOUNTER — Telehealth: Payer: Self-pay | Admitting: Internal Medicine

## 2024-01-11 VITALS — BP 148/82 | HR 78 | Temp 97.9°F | Ht 65.0 in | Wt 178.2 lb

## 2024-01-11 DIAGNOSIS — I10 Essential (primary) hypertension: Secondary | ICD-10-CM

## 2024-01-11 NOTE — Progress Notes (Signed)
 Acute Office Visit  Subjective:     Patient ID: Dawn Vargas, female    DOB: 05/30/1951, 73 y.o.   MRN: [id number]  Chief Complaint  Patient presents with   Medical Management of Chronic Issues    Elevated BP, cold, head pain, loss of appetite, lightheaded    HPI Patient is in today for uncontrolled hypertension.  Patient states that she has had intermittent episodes of uncontrolled hypertension with blood pressures as high as the 260s.  Patient has been to the emergency room 3 times for this with negative workup including EKGs, troponins and blood work.  Patient does have some associated symptoms like tremors, headaches, lightheadedness, increased anxiety and episodes of left-sided chest pain radiating to the back which are sharp in nature.  Patient is seen in the ED when she had her last episode of left-sided chest pain and cardiac workup was negative.  Patient has not had any recurrent chest pain since Friday.  She  Review of Systems  Constitutional: Negative.   HENT: Negative.    Respiratory: Negative.  Negative for shortness of breath and wheezing.   Cardiovascular:  Positive for chest pain. Negative for leg swelling.  Gastrointestinal: Negative.  Negative for abdominal pain, nausea and vomiting.  Neurological:  Positive for dizziness, tremors and headaches.  Psychiatric/Behavioral:  The patient is nervous/anxious.         Objective:    BP (!) 148/82   Pulse 78   Temp 97.9 F (36.6 C)   Ht 5' 5 (1.651 m)   Wt 178 lb 3.2 oz (80.8 kg)   LMP 05/21/1998   SpO2 98%   BMI 29.65 kg/m    Physical Exam Constitutional:      Appearance: Normal appearance.  HENT:     Head: Normocephalic and atraumatic.  Cardiovascular:     Rate and Rhythm: Normal rate and regular rhythm.     Heart sounds: Normal heart sounds.  Pulmonary:     Breath sounds: Normal breath sounds. No wheezing or rales.  Abdominal:     General: Bowel sounds are normal. There is no distension.      Palpations: Abdomen is soft.     Tenderness: There is no abdominal tenderness.  Musculoskeletal:        General: No swelling.  Neurological:     Mental Status: She is alert and oriented to person, place, and time.     Comments: Only mild tremor currently  Psychiatric:        Mood and Affect: Mood normal.        Behavior: Behavior normal.     No results found for any visits on 01/11/24.      Assessment & Plan:   Problem List Items Addressed This Visit       Cardiovascular and Mediastinum   Hypertension - Primary   - Patient with uncontrolled hypertension over the last month - Patient complains of intermittent episodes of anxiety, palpitations, chest pain, headaches and lightheadedness associated with increased blood pressures  with the highest systolic providers being in the 260s -Patient states that these episodes are intermittent and in between these episodes her blood pressure is improved and her other symptoms are also improved -Patient did multiple workups in the ED.  I personally reviewed her lab work and EKG from her last ED visit with normal TSH, normal BMP, normal BNP, normal CBC, normal troponins, EKG without any acute ST/T wave changes -I did note that her potassium was on the lower  end of normal -Will workup the patient for secondary hypertension given the episodic nature of her symptoms as well as her significantly elevated blood pressures -Will check serum metanephrines, cortisol level, renin Aldo ratio -If all of these are normal would consider renal artery Dopplers -Patient will need close follow-up in the clinic.  Would have patient follow-up in 1 week to discuss results and further workup - Continue with losartan  and amlodipine  for now as blood pressure today is 148/82 -No further workup at this time      Relevant Orders   Aldosterone + renin activity w/ ratio   Metanephrines, plasma   Cortisol    No orders of the defined types were placed in this  encounter.   No follow-ups on file.  Hassel Uphoff, MD

## 2024-01-11 NOTE — Telephone Encounter (Signed)
 Can this patient just return for f/u with Dr Onesimo?

## 2024-01-11 NOTE — Addendum Note (Signed)
 Addended by: SEBASTIAN NORRIS A on: 01/11/2024 09:09 AM   Modules accepted: Orders

## 2024-01-11 NOTE — Telephone Encounter (Signed)
 Patient was seen today by Dr Onesimo today for high bp, heart palpations, etc. Dr Onesimo wants patient to be seen by Dr Glendia in one week. No available appointments.

## 2024-01-11 NOTE — Patient Instructions (Addendum)
-   Was a pleasure meeting you today -I am unsure what is causing elevated blood pressures but given the paroxysmal nature as well as how high your blood pressures have been intermittently I am concerned for a secondary cause of your hypertension. -We will check some blood work today to workup secondary causes of hypertension -Please follow-up with Dr. Glendia in 1 week -Please continue with your current medications (losartan  and amlodipine )

## 2024-01-11 NOTE — Assessment & Plan Note (Addendum)
-   Patient with uncontrolled hypertension over the last month - Patient complains of intermittent episodes of anxiety, palpitations, chest pain, headaches and lightheadedness associated with increased blood pressures  with the highest systolic providers being in the 260s -Patient states that these episodes are intermittent and in between these episodes her blood pressure is improved and her other symptoms are also improved -Patient did multiple workups in the ED.  I personally reviewed her lab work and EKG from her last ED visit with normal TSH, normal BMP, normal BNP, normal CBC, normal troponins, EKG without any acute ST/T wave changes -I did note that her potassium was on the lower end of normal -Will workup the patient for secondary hypertension given the episodic nature of her symptoms as well as her significantly elevated blood pressures -Will check serum metanephrines, cortisol level, renin Aldo ratio -If all of these are normal would consider renal artery Dopplers -Patient will need close follow-up in the clinic.  Would have patient follow-up in 1 week to discuss results and further workup - Continue with losartan  and amlodipine  for now as blood pressure today is 148/82 -No further workup at this time

## 2024-01-13 ENCOUNTER — Encounter: Payer: Self-pay | Admitting: Nurse Practitioner

## 2024-01-13 ENCOUNTER — Ambulatory Visit: Attending: Nurse Practitioner | Admitting: Nurse Practitioner

## 2024-01-13 ENCOUNTER — Ambulatory Visit (INDEPENDENT_AMBULATORY_CARE_PROVIDER_SITE_OTHER)

## 2024-01-13 VITALS — BP 124/92 | HR 76 | Ht 64.0 in | Wt 178.0 lb

## 2024-01-13 DIAGNOSIS — I10 Essential (primary) hypertension: Secondary | ICD-10-CM | POA: Diagnosis not present

## 2024-01-13 DIAGNOSIS — R002 Palpitations: Secondary | ICD-10-CM

## 2024-01-13 DIAGNOSIS — R072 Precordial pain: Secondary | ICD-10-CM | POA: Diagnosis not present

## 2024-01-13 MED ORDER — METOPROLOL TARTRATE 100 MG PO TABS: ORAL_TABLET | ORAL | 0 refills | Status: DC

## 2024-01-13 NOTE — Progress Notes (Signed)
 Cardiology Clinic Note   Patient Name: Dawn Vargas Date of Encounter: 01/13/2024  Primary Care Provider:  Glendia Shad, MD Primary Cardiologist:  None  Cardiology APP:  Vivienne Lonni Ingle, NP   Patient Profile    73 y.o. female with a history of hypertension, chest pain, peptic ulcer disease, reactive airway disease, pulmonary nodules, and chronic back pain, who presents for follow-up related to chest pain, palpitations, and hypertension.  Past Medical History    Past Medical History:  Diagnosis Date   Allergic asthma    Allergic rhinitis    Arthritis    Chest pain    a. 06/2010 MV: no isch/infarct. Nl EF - later dx/ PUD.   Chronic back pain    s/p surgery for herniated disc 93/89)   Complication of anesthesia    Frequent UTI    GERD (gastroesophageal reflux disease)    pt. states no symptoms   History of bronchitis    History of kidney stones    History of shingles    Hypertension    PONV (postoperative nausea and vomiting)    pt. states history of nausea in 1989   PUD (peptic ulcer disease)    a. Dx 2012 in setting of chest and epigastric pain.   Pulmonary nodules    a. 03/2012 CT Chest: Stable tiny nodules in both lungs without new lesions.   Reactive airway disease    Rotator cuff tear    left shoulder- pt. states healing   Past Surgical History:  Procedure Laterality Date   BACK SURGERY     CHOLECYSTECTOMY  August 25 2012   COLONOSCOPY  2013   ESOPHAGOGASTRODUODENOSCOPY  01/2011   EYE SURGERY     laporoscopy  08/22/1997   VAGINAL HYSTERECTOMY  1999   laparoscopic assisted    Allergies  Allergies  Allergen Reactions   Other Other (See Comments)    Smoke - severe reactive airway disease    Benadryl [Diphenhydramine Hcl] Hives   Oxycodone Other (See Comments)    dizzy   Beef-Derived Drug Products Other (See Comments)    Gi upset   Caladryl [Pramoxine-Calamine] Hives   Clarithromycin Other (See Comments)    Gi upset   Cleocin  [Clindamycin Hcl] Other (See Comments)    GI upset    Egg-Derived Products Diarrhea   Hydrochlorothiazide Other (See Comments)    dizzy   Pegademase Bovine Nausea And Vomiting    Gi upset   Soybean-Containing Drug Products Diarrhea   Tomato Other (See Comments)    Small amount is okay, large amount causes diarrhea - Also potatoes    History of Present Illness    73 y.o. y/o female with a history of hypertension, chest pain, peptic ulcer disease, reactive airway disease, irritable bowel syndrome, pulmonary nodules, and chronic back pain.  She lives locally by herself.  She does not routinely exercise but notes that she is always active and enjoys gardening and being outside.  She has several family members with a history of heart disease including her mother and sister, both of which had stents placed in their 50s.  Her mother eventually died of a myocardial infarction.  She has brother with a pacemaker and is also status post PCI.  Dawn Vargas was previously evaluated for chest pain in early 2012.  She was seen by Dr. Carnella and underwent stress testing in January 2012 which showed normal LV function without ischemia or infarct.  She subsequently underwent evaluation for gallbladder  disease.  She says prior to her cholecystectomy, she underwent EGD which showed significant peptic ulcer disease.  Following adequate treatment, cholecystectomy was eventually performed.  She was also previously followed for lung nodules.  CT scan in October 2013 showed stable tiny nodules in both lungs.  Though imaging is not available for review, there was no mention of coronary calcium.  Ms.  Senk indicates that she has done well over the years and has mostly been able to remain active, though she is no longer walking regularly due to chronic back pain and right knee pain.  She has been treated for hypertension with losartan  therapy for many years and has been on 25 mg twice daily for at least the past 2 years.   Unfortunately, over the past several weeks, she has been experiencing elevated blood pressures with an assortment of symptoms.  On July 22, she was experiencing sharp headaches, jitteriness, tremors of her upper extremities, lightheadedness, and brain fog.  She checked her blood pressure at home and it was up to 260 systolic.  She presented to the emergency department where pressure was 234/114. Lab work and head CT were unremarkable.  Pressure improved during ER visit and she was advised to continue losartan  25 mg twice daily and follow-up with primary care.  She was then seen at the [facility] emergency department July 25, for ongoing elevations in blood pressures at home.  Pressure was 200/117 in the emergency department.  Per notes, ECG was unremarkable.  She was subsequently discharged with recommendation for PCP follow-up.    On January 08, 2024, she was at home, just relaxing, when she began to notice tachypalpitations associated with lightheadedness and left chest discomfort radiating to her back.  Symptoms lasted less than 1 minute but she was left with a residual, mild pressure across her chest, prompting ED evaluation.  Initial blood pressure in the emergency room was 180/113.  ECG showed sinus rhythm at 81 bpm with question of septal infarct.  Lab work was unremarkable including normal troponin and D-dimer.  Chest x-ray showed linear opacity of the left lung base likely scarring or atelectasis without acute cardiopulmonary disease.  She was discharged home and after discussed with primary care, was placed on amlodipine  5 mg daily.  Unfortunately, she was seen again in the emergency department on August 1, due to ongoing blood pressure elevations and chest pain.  Pressure was 180/113 in the emergency department.  ECG showed sinus rhythm at 81 bpm with question of septal infarct.  Lab work unremarkable including normal troponin and D-dimer.  Chest x-ray showed linear opacity of the left lung base likely  scarring or atelectasis with otherwise no acute cardiopulmonary disease.  She was subsequently discharged home.  She saw primary care August 4 at which time blood pressure was 148/82.  Serum metanephrines, cortisol, and renin:aldosterone ratio were ordered though have not yet resulted.  Patient says that since her August 1 ER visit, she has had no recurrence of palpitations.  She has had at least 1 episode of fleeting left chest discomfort just yesterday.  She also continues to experience intermittent episodes of sharp headaches, lightheadedness, jitteriness, hand tremors, and brain fog.  She denies dyspnea, PND, orthopnea, syncope, edema, or early satiety.  Objective   Home Medications    Current Outpatient Medications  Medication Sig Dispense Refill   acetaminophen (TYLENOL) 650 MG CR tablet Take 1,300 mg by mouth at bedtime. (Patient taking differently: Take 1,300 mg by mouth as needed (for pain).)  amLODipine  (NORVASC ) 5 MG tablet Take 1 tablet (5 mg total) by mouth daily. 30 tablet 0   D-MANNOSE PO Take by mouth.     Flaxseed, Linseed, (FLAXSEED OIL PO) Take 1 capsule by mouth daily.     Glucosamine-Chondroitin-MSM-D (TRIPLE FLEX/VITAMIN D3) TABS Take 1 tablet by mouth daily.     losartan  (COZAAR ) 25 MG tablet Take 1 tablet by mouth twice daily 180 tablet 1   metoprolol  tartrate (LOPRESSOR ) 100 MG tablet TAKE 1 TABLET 2 HR PRIOR TO CARDIAC PROCEDURE 1 tablet 0   Multiple Vitamin (MULTIVITAMIN) tablet Take 1 tablet by mouth daily.     Probiotic Product (FEM-DOPHILUS WOMENS) CAPS Take 1 capsule by mouth daily.     Turmeric 500 MG CAPS Take 500 mg by mouth daily.     B Complex-C (SUPER B COMPLEX PO) Take 1 tablet by mouth daily. (Patient not taking: Reported on 01/13/2024)     CRANBERRY PO Take 1 capsule by mouth daily. (Patient not taking: Reported on 01/13/2024)     Omega-3 Fatty Acids (FISH OIL PO) Take 1 capsule by mouth daily. (Patient not taking: Reported on 01/13/2024)     predniSONE   (DELTASONE ) 20 MG tablet Take 2 tablets (40 mg total) by mouth daily with breakfast. (Patient not taking: Reported on 01/13/2024) 10 tablet 0   tiZANidine  (ZANAFLEX ) 2 MG tablet Take 1 tablet (2 mg total) by mouth at bedtime. (Patient not taking: Reported on 01/13/2024) 30 tablet 0   No current facility-administered medications for this visit.     Family History    Family History  Problem Relation Age of Onset   Heart disease Mother        Stents in her 20's, died of MI in her 34's   Hypertension Mother    Heart failure Mother    Dementia Father    Prostate cancer Father    Hypertension Sister    CAD Sister        PCI in  her 59's   Heart disease Brother        angioplasty; pacemaker   Hypertension Brother    Breast cancer Cousin        maternal side-50's   Colon cancer Neg Hx    She indicated that her mother is deceased. She indicated that her father is deceased. She indicated that the status of her sister is unknown. She indicated that the status of her cousin is unknown. She indicated that the status of her neg hx is unknown.   Social History    Social History   Socioeconomic History   Marital status: Single    Spouse name: Not on file   Number of children: Not on file   Years of education: Not on file   Highest education level: Not on file  Occupational History   Not on file  Tobacco Use   Smoking status: Never   Smokeless tobacco: Never  Substance and Sexual Activity   Alcohol use: Yes    Comment: socially, rare glass of wine   Drug use: No   Sexual activity: Not on file  Other Topics Concern   Not on file  Social History Narrative   Divorced.  Lives in [place] by herself.  Active but does not routinely exercise.  Ambulation somewhat limited by back and R knee pain.   Social Drivers of Corporate investment banker Strain: Not on file  Food Insecurity: Not on file  Transportation Needs: Not on file  Physical Activity:  Not on file  Stress: Not on file   Social Connections: Not on file  Intimate Partner Violence: Not on file     Review of Systems    General:  No chills, fever, night sweats or weight changes.  Cardiovascular:  +++ chest pain, no dyspnea on exertion, edema, orthopnea, +++ palpitations, no paroxysmal nocturnal dyspnea, +++ Lightheadedness. Respiratory: No cough, dyspnea Urologic: No hematuria, dysuria Abdominal:   No nausea, vomiting, diarrhea, bright red blood per rectum, melena, or hematemesis Neurologic:  +++ Headaches. +++ Intermittent upper extremity tremors.  No visual changes, wkns, changes in mental status. All other systems reviewed and are otherwise negative except as noted above.     Physical Exam    VS:  BP (!) 124/92 (BP Location: Left Arm, Patient Position: Sitting, Cuff Size: Normal)   Pulse 76   Ht 5' 4 (1.626 m)   Wt 178 lb (80.7 kg)   LMP 05/21/1998   SpO2 98%   BMI 30.55 kg/m  , BMI Body mass index is 30.55 kg/m.        GEN: Well nourished, well developed, in no acute distress. HEENT: normal. Neck: Supple, no JVD, carotid bruits, or masses. Cardiac: RRR, no murmurs, rubs, or gallops. No clubbing, cyanosis, edema.  Radials 2+/PT 2+ and equal bilaterally.  Respiratory:  Respirations regular and unlabored, clear to auscultation bilaterally. GI: Soft, nontender, nondistended, BS + x 4. MS: no deformity or atrophy. Skin: warm and dry, no rash. Neuro:  Strength and sensation are intact. Psych: Normal affect.  Accessory Clinical Findings    ECG dated January 08, 2024 personally reviewed by me today-    sinus rhythm at 81 bpm with question of septal infarct - No acute changes  Lab Results  Component Value Date   WBC 5.1 01/08/2024   HGB 14.5 01/08/2024   HCT 42.6 01/08/2024   MCV 90.1 01/08/2024   PLT 220 01/08/2024   Lab Results  Component Value Date   CREATININE 0.49 01/08/2024   BUN 14 01/08/2024   NA 137 01/08/2024   K 3.7 01/08/2024   CL 100 01/08/2024   CO2 25 01/08/2024    Lab Results  Component Value Date   ALT 14 03/18/2023   AST 15 03/18/2023   GGT 20 07/02/2017   ALKPHOS 59 03/18/2023   BILITOT 0.4 03/18/2023   Lab Results  Component Value Date   CHOL 156 03/18/2023   HDL 71.40 03/18/2023   LDLCALC 73 03/18/2023   TRIG 55.0 03/18/2023   CHOLHDL 2 03/18/2023    Lab Results  Component Value Date   HGBA1C 5.5 11/10/2016      Assessment & Plan   1.  Precordial Pain: Patient with prior history of hypertension and chest pain with previous negative Myoview in 2012.  She also has a family history of CAD though no premature CAD.  She presents for follow-up after recent emergency department evaluation in the setting of palpitations and precordial pain and pressure on January 08, 2024.  Initial symptoms of left chest pain occurred in the setting of hypertension and palpitations and resolved after about a minute but mild pressure-like sensation persisted thereafter.  ER evaluation notable for nonischemic ECG, normal troponins and D-dimer.  She has had at least 1 episode of somewhat fleeting left-sided chest discomfort since her ER evaluation.  She is chest pain-free today.  We discussed options for evaluation.  Due to chronic back and knee pain, she does not think she is able to walk on  a treadmill.  We agreed to pursue a coronary CT angiogram to rule out obstructive coronary artery disease.  Creatinine was normal in August 1 at 0.49.  She will be provided a prescription for metoprolol  100 mg to be taken times 1 in the morning of the study.  2.  Primary HTN: Long history of hypertension historically managed with losartan  therapy but over the past 3 weeks, she has been experiencing blood pressure spikes over 200 mmHg systolic.  Amlodipine  therapy has since been added to her regimen in late July, and her blood pressure is improved today at 124/92.  Continue current doses of losartan  and amlodipine  for now and we discussed that there are options for titration with  both medications if necessary.  She recently had serum metanephrines, cortisol, and renin aldosterone ratio drawn through her primary care office on August 4 and all of those results are currently pending.  We discussed that if pressures remain poorly controlled despite titration of medications, we would have a low threshold to assess renal artery duplex to rule out renal artery stenosis.  Likewise, if workup unremarkable and pressures remain difficult to control, can look to have her seen in our hypertension clinic in [place].  3.  Palpitations: Patient with tachypalpitations on August 1 that became associated with chest discomfort.  ECG that day showed sinus rhythm.  She has not had any recurrence of palpitations.  We discussed options for evaluation and agreed to place a 2-week ZIO monitor with left telemetry.  Lab work August 1 notable for normal electrolytes and TSH.  4.  Disposition: Follow-up coronary CT angiogram and ZIO monitor.  Follow-up in clinic in 4 weeks or sooner if necessary.  Lonni Meager, NP 01/13/2024, 12:38 PM

## 2024-01-13 NOTE — Telephone Encounter (Signed)
 Reviewed cardiology note. Blood pressure better. CTA ordered. Zio monitor ordered.

## 2024-01-13 NOTE — Patient Instructions (Addendum)
 Medication Instructions:   Your physician recommends that you continue on your current medications as directed. Please refer to the Current Medication list given to you today.   *If you need a refill on your cardiac medications before your next appointment, please call your pharmacy*  Lab Work:  No labs ordered today   If you have labs (blood work) drawn today and your tests are completely normal, you will receive your results only by: MyChart Message (if you have MyChart) OR A paper copy in the mail If you have any lab test that is abnormal or we need to change your treatment, we will call you to review the results.  Testing/Procedures: Your physician has recommended that you wear a Zio AT Live monitor.   This monitor is a medical device that records the heart's electrical activity. Doctors most often use these monitors to diagnose arrhythmias. Arrhythmias are problems with the speed or rhythm of the heartbeat. The monitor is a small device applied to your chest. You can wear one while you do your normal daily activities. While wearing this monitor if you have any symptoms to push the button and record what you felt. Once you have worn this monitor for the period of time provider prescribed (Usually 14 days), you will return the monitor device in the postage paid box/bag. Once it is returned they will download the data collected and provide us  with a report which the provider will then review and we will call you with those results.   Important tips:  Avoid showering during the first 24 hours of wearing the monitor. Avoid excessive sweating to help maximize wear time. Do not submerge the device, no hot tubs, and no swimming pools. Keep any lotions or oils away from the patch. After 24 hours you may shower with the patch on. Take brief showers with your back facing the shower head.  Do not remove patch once it has been placed because that will interrupt data and decrease adhesive wear  time. Push the button when you have any symptoms and write down what you were feeling. Once you have completed wearing your monitor, remove and place into box which has postage paid and place in your outgoing mailbox.  If for some reason you have misplaced your box then call our office and we can provide another box and/or mail it off for you. Keep the transmitter within 10 feet at all times.  Expect a welcome phone call within 24-48 hrs of application from Zio.  This call will include your copay information, so please answer any unknown phone calls while wearing Zio (it could also be important information about your heart) The envelope to return Zio is in the back of the transmitter. Removal instructions are on the last page of the symptom diary.  Place the patch sticky side up inside the transmitter and the symptom diary inside the envelope to return on your last wear day inside your mailbox or any USPS mailbox.      Your cardiac CT will be scheduled at one of the below locations:   [facility] [address] [place], [place] [address] [phone]  Please arrive 15 mins early for check-in and test prep.  There is spacious parking and easy access to the radiology department from the [facility] entrance. This entrance also has valet parking.  Please enter here and check-in with the desk attendant.   Please follow these instructions carefully (unless otherwise directed):  An IV  will be required for this test and Nitroglycerin will be given.   On the Night Before the Test: Be sure to Drink plenty of water. Do not consume any caffeinated/decaffeinated beverages or chocolate 12 hours prior to your test. Do not take any antihistamines 12 hours prior to your test.  On the Day of the Test: Drink plenty of water until 1 hour prior to the test. Do not eat any food 1 hour prior to test. You may take your regular medications prior to the test.  Take  metoprolol  (Lopressor ) two hours prior to test. FEMALES- please wear underwire-free bra if available, avoid dresses & tight clothing  *For Clinical Staff only. Please instruct patient the following:* Heart Rate Medication Recommendations for Cardiac CT  Resting HR < 50 bpm  No medication  Resting HR 50-60 bpm and BP >110/50 mmHG   Consider Metoprolol  tartrate 25 mg PO 90-120 min prior to scan  Resting HR 60-65 bpm and BP >110/50 mmHG  Metoprolol  tartrate 50 mg PO 90-120 minutes prior to scan   Resting HR > 65 bpm and BP >110/50 mmHG  Metoprolol  tartrate 100 mg PO 90-120 minutes prior to scan  Consider Ivabradine 10-15 mg PO or a calcium channel blocker for resting HR >60 bpm and contraindication to metoprolol  tartrate  Consider Ivabradine 10-15 mg PO in combination with metoprolol  tartrate for HR >80 bpm        After the Test: Drink plenty of water. After receiving IV contrast, you may experience a mild flushed feeling. This is normal. On occasion, you may experience a mild rash up to 24 hours after the test. This is not dangerous. If this occurs,  Zyrtec, Claritin, or Allegra and increase your fluid intake. (Patients taking Tikosyn should avoid Benadryl, and may take Zyrtec, Claritin, or Allegra) If you experience trouble breathing, this can be serious. If it is severe call 911 IMMEDIATELY. If it is mild, please call our office.  We will call to schedule your test 2-4 weeks out understanding that some insurance companies will need an authorization prior to the service being performed.   For more information and frequently asked questions, please visit our website : [email or web address]  For non-scheduling related questions, please contact the cardiac imaging nurse navigator should you have any questions/concerns: Cardiac Imaging Nurse Navigators Direct Office Dial: [phone]   For scheduling needs, including cancellations and rescheduling, please call [place],  [phone].    Follow-Up: At [facility], you and your health needs are our priority.  As part of our continuing mission to provide you with exceptional heart care, our providers are all part of one team.  This team includes your primary Cardiologist (physician) and Advanced Practice Providers or APPs (Physician Assistants and Nurse Practitioners) who all work together to provide you with the care you need, when you need it.  Your next appointment:   1 month(s)  Provider:   Lonni Meager, NP    We recommend signing up for the patient portal called MyChart.  Sign up information is provided on this After Visit Summary.  MyChart is used to connect with patients for Virtual Visits (Telemedicine).  Patients are able to view lab/test results, encounter notes, upcoming appointments, etc.  Non-urgent messages can be sent to your provider as well.   To learn more about what you can do with MyChart, go to [email or web address].

## 2024-01-13 NOTE — Telephone Encounter (Signed)
 Called pt and scheduled her for 8/12 @9  am she wanted me to let Dr. Glendia know that she is going to see cardiologist  this am. She was referred from the hospital when she went on Friday.

## 2024-01-13 NOTE — Telephone Encounter (Signed)
 If spot still available, see if she can come in 01/19/24 - 9:00.

## 2024-01-18 DIAGNOSIS — R002 Palpitations: Secondary | ICD-10-CM | POA: Diagnosis not present

## 2024-01-19 ENCOUNTER — Encounter: Payer: Self-pay | Admitting: Internal Medicine

## 2024-01-19 ENCOUNTER — Ambulatory Visit (INDEPENDENT_AMBULATORY_CARE_PROVIDER_SITE_OTHER): Admitting: Internal Medicine

## 2024-01-19 VITALS — BP 138/82 | HR 84 | Temp 98.4°F | Ht 64.0 in | Wt 178.2 lb

## 2024-01-19 DIAGNOSIS — M25561 Pain in right knee: Secondary | ICD-10-CM | POA: Diagnosis not present

## 2024-01-19 DIAGNOSIS — I10 Essential (primary) hypertension: Secondary | ICD-10-CM | POA: Diagnosis not present

## 2024-01-19 LAB — BASIC METABOLIC PANEL WITH GFR
BUN: 10 mg/dL (ref 6–23)
CO2: 29 meq/L (ref 19–32)
Calcium: 9.3 mg/dL (ref 8.4–10.5)
Chloride: 101 meq/L (ref 96–112)
Creatinine, Ser: 0.43 mg/dL (ref 0.40–1.20)
GFR: 95.81 mL/min (ref >60.00)
Glucose, Bld: 104 mg/dL — ABNORMAL HIGH (ref 70–99)
Potassium: 3.8 meq/L (ref 3.5–5.1)
Sodium: 137 meq/L (ref 135–145)

## 2024-01-19 LAB — LIPID PANEL
Cholesterol: 156 mg/dL (ref 0–200)
HDL: 69.3 mg/dL (ref >39.00)
LDL Cholesterol: 77 mg/dL (ref 0–99)
NonHDL: 86.42
Total CHOL/HDL Ratio: 2
Triglycerides: 49 mg/dL (ref 0.0–149.0)
VLDL: 9.8 mg/dL (ref 0.0–40.0)

## 2024-01-19 LAB — HEPATIC FUNCTION PANEL
ALT: 12 U/L (ref 0–35)
AST: 13 U/L (ref 0–37)
Albumin: 4.4 g/dL (ref 3.5–5.2)
Alkaline Phosphatase: 59 U/L (ref 39–117)
Bilirubin, Direct: 0.1 mg/dL (ref 0.0–0.3)
Total Bilirubin: 0.5 mg/dL (ref 0.2–1.2)
Total Protein: 6.6 g/dL (ref 6.0–8.3)

## 2024-01-19 NOTE — Progress Notes (Signed)
 Subjective:    Patient ID: Dawn Vargas, female    DOB: [birth year], 73 y.o.   MRN: [id number]  Patient here for  Chief Complaint  Patient presents with   Medical Management of Chronic Issues    F/u on B/P and heart palpitations     HPI Here for work in appt - work in to discuss elevated blood pressure and palpitations. Has been seen ER recently for elevated blood pressure. Notes reviewed. CXR - linear opacity of the left lung base likely scarring or atelectasis without acute cardiopulmonary disease. Started amlodipine  (in addition to losartan ) 01/11/24. Saw cardiology 01/13/24. Recommended CTA. Also recommended zio monitor. Wearing zio monitor. Itching. Plans to take off early. Has worn for a couple of days. Blood pressure has been doing better overall. No palpitations now. No tremors. Eating - healthier. Has noticed decreased appetite. No vomiting. Bowels stable. Some increased burping. Is being followed by Dr Claudene - states had gluteus tear. Better. Pain better. Walking. Able to go up and down stairs.    Past Medical History:  Diagnosis Date   Allergic asthma    Allergic rhinitis    Arthritis    Chest pain    a. 06/2010 MV: no isch/infarct. Nl EF - later dx/ PUD.   Chronic back pain    s/p surgery for herniated disc 93/89)   Complication of anesthesia    Frequent UTI    GERD (gastroesophageal reflux disease)    pt. states no symptoms   History of bronchitis    History of kidney stones    History of shingles    Hypertension    PONV (postoperative nausea and vomiting)    pt. states history of nausea in 1989   PUD (peptic ulcer disease)    a. Dx 2012 in setting of chest and epigastric pain.   Pulmonary nodules    a. 03/2012 CT Chest: Stable tiny nodules in both lungs without new lesions.   Reactive airway disease    Rotator cuff tear    left shoulder- pt. states healing   Past Surgical History:  Procedure Laterality Date   BACK SURGERY     CHOLECYSTECTOMY  August 25 2012    COLONOSCOPY  2013   ESOPHAGOGASTRODUODENOSCOPY  01/2011   EYE SURGERY     laporoscopy  08/22/1997   VAGINAL HYSTERECTOMY  1999   laparoscopic assisted   Family History  Problem Relation Age of Onset   Heart disease Mother        Stents in her 42's, died of MI in her 62's   Hypertension Mother    Heart failure Mother    Dementia Father    Prostate cancer Father    Hypertension Sister    CAD Sister        PCI in  her 44's   Heart disease Brother        angioplasty; pacemaker   Hypertension Brother    Breast cancer Cousin        maternal side-50's   Colon cancer Neg Hx    Social History   Socioeconomic History   Marital status: Single    Spouse name: Not on file   Number of children: Not on file   Years of education: Not on file   Highest education level: Not on file  Occupational History   Not on file  Tobacco Use   Smoking status: Never   Smokeless tobacco: Never  Substance and Sexual Activity   Alcohol use: Yes  Comment: socially, rare glass of wine   Drug use: No   Sexual activity: Not on file  Other Topics Concern   Not on file  Social History Narrative   Divorced.  Lives in [place] by herself.  Active but does not routinely exercise.  Ambulation somewhat limited by back and R knee pain.   Social Drivers of Corporate investment banker Strain: Not on file  Food Insecurity: Not on file  Transportation Needs: Not on file  Physical Activity: Not on file  Stress: Not on file  Social Connections: Not on file     Review of Systems  Constitutional:  Positive for appetite change. Negative for fever.  HENT:  Negative for congestion and sinus pressure.   Respiratory:  Negative for cough, chest tightness and shortness of breath.   Cardiovascular:  Negative for chest pain and leg swelling.       No palpitations now.   Gastrointestinal:  Negative for nausea and vomiting.       No abdominal pain.   Genitourinary:  Negative for difficulty urinating and dysuria.   Musculoskeletal:  Negative for joint swelling and myalgias.  Skin:  Negative for color change and rash.  Neurological:  Negative for dizziness and headaches.  Psychiatric/Behavioral:  Negative for agitation and dysphoric mood.        Objective:     BP 138/82 (BP Location: Left Arm, Patient Position: Sitting, Cuff Size: Normal)   Pulse 84   Temp 98.4 F (36.9 C) (Oral)   Ht 5' 4 (1.626 m)   Wt 178 lb 3.2 oz (80.8 kg)   LMP 05/21/1998   SpO2 97%   BMI 30.59 kg/m  Wt Readings from Last 3 Encounters:  01/19/24 178 lb 3.2 oz (80.8 kg)  01/13/24 178 lb (80.7 kg)  01/11/24 178 lb 3.2 oz (80.8 kg)    Physical Exam Vitals reviewed.  Constitutional:      General: She is not in acute distress.    Appearance: Normal appearance.  HENT:     Head: Normocephalic and atraumatic.     Right Ear: External ear normal.     Left Ear: External ear normal.     Mouth/Throat:     Pharynx: No oropharyngeal exudate or posterior oropharyngeal erythema.  Eyes:     General: No scleral icterus.       Right eye: No discharge.        Left eye: No discharge.     Conjunctiva/sclera: Conjunctivae normal.  Neck:     Thyroid : No thyromegaly.  Cardiovascular:     Rate and Rhythm: Normal rate and regular rhythm.  Pulmonary:     Effort: No respiratory distress.     Breath sounds: Normal breath sounds. No wheezing.  Abdominal:     General: Bowel sounds are normal.     Palpations: Abdomen is soft.     Tenderness: There is no abdominal tenderness.  Musculoskeletal:        General: No swelling or tenderness.     Cervical back: Neck supple. No tenderness.  Lymphadenopathy:     Cervical: No cervical adenopathy.  Skin:    Findings: No erythema or rash.  Neurological:     Mental Status: She is alert.  Psychiatric:        Mood and Affect: Mood normal.        Behavior: Behavior normal.         Outpatient Encounter Medications as of 01/19/2024  Medication Sig   acetaminophen (TYLENOL)  650 MG CR  tablet Take 1,300 mg by mouth at bedtime. (Patient taking differently: Take 1,300 mg by mouth as needed (for pain).)   amLODipine  (NORVASC ) 5 MG tablet Take 1 tablet (5 mg total) by mouth daily.   B Complex-C (SUPER B COMPLEX PO) Take 1 tablet by mouth daily.   D-MANNOSE PO Take by mouth.   Flaxseed, Linseed, (FLAXSEED OIL PO) Take 1 capsule by mouth daily.   Glucosamine-Chondroitin-MSM-D (TRIPLE FLEX/VITAMIN D3) TABS Take 1 tablet by mouth daily.   losartan  (COZAAR ) 25 MG tablet Take 1 tablet by mouth twice daily   metoprolol  tartrate (LOPRESSOR ) 100 MG tablet TAKE 1 TABLET 2 HR PRIOR TO CARDIAC PROCEDURE   Multiple Vitamin (MULTIVITAMIN) tablet Take 1 tablet by mouth daily.   Omega-3 Fatty Acids (FISH OIL PO) Take 1 capsule by mouth daily.   Probiotic Product (FEM-DOPHILUS WOMENS) CAPS Take 1 capsule by mouth daily.   Turmeric 500 MG CAPS Take 500 mg by mouth daily.   CRANBERRY PO Take 1 capsule by mouth daily. (Patient not taking: Reported on 01/19/2024)   predniSONE  (DELTASONE ) 20 MG tablet Take 2 tablets (40 mg total) by mouth daily with breakfast. (Patient not taking: Reported on 01/19/2024)   tiZANidine  (ZANAFLEX ) 2 MG tablet Take 1 tablet (2 mg total) by mouth at bedtime. (Patient not taking: Reported on 01/19/2024)   No facility-administered encounter medications on file as of 01/19/2024.     Lab Results  Component Value Date   WBC 5.1 01/08/2024   HGB 14.5 01/08/2024   HCT 42.6 01/08/2024   PLT 220 01/08/2024   GLUCOSE 107 (H) 01/08/2024   CHOL 156 03/18/2023   TRIG 55.0 03/18/2023   HDL 71.40 03/18/2023   LDLCALC 73 03/18/2023   ALT 14 03/18/2023   AST 15 03/18/2023   NA 137 01/08/2024   K 3.7 01/08/2024   CL 100 01/08/2024   CREATININE 0.49 01/08/2024   BUN 14 01/08/2024   CO2 25 01/08/2024   TSH 1.006 01/08/2024   INR 0.98 02/22/2015   HGBA1C 5.5 11/10/2016    DG Chest 2 View Result Date: 01/08/2024 CLINICAL DATA:  Chest pain EXAM: CHEST - 2 VIEW COMPARISON:  X-ray  02/22/2015 FINDINGS: There is some linear opacity at the left lung base likely scar or atelectasis. Normal cardiopericardial silhouette. Calcified aorta. Overlapping cardiac leads. No consolidation, pneumothorax or effusion. No edema. Film is under penetrated. Degenerative changes of the spine on the lateral view. IMPRESSION: There is some linear opacity left left lung base likely scar or atelectasis. Otherwise no acute cardiopulmonary disease. Electronically Signed   By: Ranell Bring M.D.   On: 01/08/2024 11:57       Assessment & Plan:  Right knee pain, unspecified chronicity Assessment & Plan: Has seen Dr Claudene. Doing better. Able to walk and going up and down stairs without pain. Follow.    Primary hypertension Assessment & Plan: Blood pressure doing better with the addition of amlodipine . Continue losartan . No changes in medication today. She is spot checking her pressure. Call in readings. Scheduled for CTA. Wearing zio monitor. Get her back in soot to reassess.   Orders: -     Basic metabolic panel with GFR -     Hepatic function panel -     Lipid panel     Allena Hamilton, MD

## 2024-01-19 NOTE — Assessment & Plan Note (Signed)
 Blood pressure doing better with the addition of amlodipine . Continue losartan . No changes in medication today. She is spot checking her pressure. Call in readings. Scheduled for CTA. Wearing zio monitor. Get her back in soot to reassess.

## 2024-01-19 NOTE — Assessment & Plan Note (Signed)
 Has seen Dr Claudene. Doing better. Able to walk and going up and down stairs without pain. Follow.

## 2024-01-20 ENCOUNTER — Ambulatory Visit: Payer: Self-pay | Admitting: Internal Medicine

## 2024-01-20 NOTE — Telephone Encounter (Signed)
 Copied from CRM [id number]. Topic: Clinical - Lab/Test Results >> Jan 20, 2024 12:31 PM Chiquita SQUIBB wrote: Reason for CRM: Patient is calling Trish back regarding lab work, relayed to patient and understood. Patient does NOT wish to start any new medications at this time.

## 2024-01-20 NOTE — Progress Notes (Unsigned)
 [facility] [address] [place] [address] Phone: [phone] Subjective:    I'm seeing this patient by the request  of:  Glendia Shad, MD  CC:   YEP:Dlagzrupcz  12/03/2023 Patient given injection and tolerated the procedure well, discussed icing regimen of home exercises, increase activity slowly.  Discussed icing regimen.  Follow-up again in 6 to 8 weeks.  Due to the inguinal crease tenderness in the calf we will get an Doppler to make sure there is no type of DVT with patient's recent travel as well.  Discussed icing regimen and home exercises.  Calf injury is also within the differential and given encouragement to wear a compression sleeve and icing regimen.  Follow-up with me again in 6 to 8 weeks     Patient given injection and tolerated the procedure well, discussed icing regimen and home exercises, discussed which activities to do and which ones to avoid.  Increase activity slowly.  Follow-up again in 6 to 8 weeks otherwise.     Updated 01/28/2024 Dawn Vargas is a 73 y.o. female coming in with complaint of R knee pain      Past Medical History:  Diagnosis Date   Allergic asthma    Allergic rhinitis    Arthritis    Chest pain    a. 06/2010 MV: no isch/infarct. Nl EF - later dx/ PUD.   Chronic back pain    s/p surgery for herniated disc 93/89)   Complication of anesthesia    Frequent UTI    GERD (gastroesophageal reflux disease)    pt. states no symptoms   History of bronchitis    History of kidney stones    History of shingles    Hypertension    PONV (postoperative nausea and vomiting)    pt. states history of nausea in 1989   PUD (peptic ulcer disease)    a. Dx 2012 in setting of chest and epigastric pain.   Pulmonary nodules    a. 03/2012 CT Chest: Stable tiny nodules in both lungs without new lesions.   Reactive airway disease    Rotator cuff tear    left shoulder- pt. states healing   Past Surgical History:   Procedure Laterality Date   BACK SURGERY     CHOLECYSTECTOMY  August 25 2012   COLONOSCOPY  2013   ESOPHAGOGASTRODUODENOSCOPY  01/2011   EYE SURGERY     laporoscopy  08/22/1997   VAGINAL HYSTERECTOMY  1999   laparoscopic assisted   Social History   Socioeconomic History   Marital status: Single    Spouse name: Not on file   Number of children: Not on file   Years of education: Not on file   Highest education level: Not on file  Occupational History   Not on file  Tobacco Use   Smoking status: Never   Smokeless tobacco: Never  Substance and Sexual Activity   Alcohol use: Yes    Comment: socially, rare glass of wine   Drug use: No   Sexual activity: Not on file  Other Topics Concern   Not on file  Social History Narrative   Divorced.  Lives in [place] by herself.  Active but does not routinely exercise.  Ambulation somewhat limited by back and R knee pain.   Social Drivers of Corporate investment banker Strain: Not on file  Food Insecurity: Not on file  Transportation Needs: Not on file  Physical Activity: Not on file  Stress:  activity: Not on file  Other Topics Concern   Not on file  Social History Narrative   Divorced.  Lives in [place] by herself.  Active but does not routinely exercise.  Ambulation somewhat limited by back and R knee pain.   Social Drivers of Corporate investment banker Strain: Not on file  Food Insecurity: Not on file  Transportation Needs: Not on file  Physical Activity: Not on file  Stress: Not on file  Social Connections: Not on file   Allergies  Allergen Reactions   Other Other (See Comments)    Smoke - severe reactive airway disease    Benadryl [Diphenhydramine Hcl] Hives   Oxycodone Other (See Comments)    dizzy   Beef-Derived Drug Products Other (See Comments)    Gi upset   Caladryl [Pramoxine-Calamine] Hives   Clarithromycin Other (See Comments)    Gi upset   Cleocin [Clindamycin Hcl] Other  (See Comments)    GI upset    Egg-Derived Products Diarrhea   Hydrochlorothiazide Other (See Comments)    dizzy   Pegademase Bovine Nausea And Vomiting    Gi upset   Soybean-Containing Drug Products Diarrhea   Tomato Other (See Comments)    Small amount is okay, large amount causes diarrhea - Also potatoes   Family History  Problem Relation Age of Onset   Heart disease Mother        Stents in her 27's, died of MI in her 36's   Hypertension Mother    Heart failure Mother    Dementia Father    Prostate cancer Father    Hypertension Sister    CAD Sister        PCI in  her 56's   Heart disease Brother        angioplasty; pacemaker   Hypertension Brother    Breast cancer Cousin        maternal side-50's   Colon cancer Neg Hx     Current Outpatient Medications (Endocrine & Metabolic):    predniSONE  (DELTASONE ) 20 MG tablet, Take 2 tablets (40 mg total) by mouth daily with breakfast. (Patient not taking: Reported on 01/19/2024)  Current Outpatient Medications (Cardiovascular):    amLODipine  (NORVASC ) 5 MG tablet, Take 1 tablet (5 mg total) by mouth daily.   losartan  (COZAAR ) 25 MG tablet, Take 1 tablet by mouth twice daily   metoprolol  tartrate (LOPRESSOR ) 100 MG tablet, TAKE 1 TABLET 2 HR PRIOR TO CARDIAC PROCEDURE   Current Outpatient Medications (Analgesics):    acetaminophen (TYLENOL) 650 MG CR tablet, Take 1,300 mg by mouth at bedtime. (Patient taking differently: Take 1,300 mg by mouth as needed (for pain).)   Current Outpatient Medications (Other):    B Complex-C (SUPER B COMPLEX PO), Take 1 tablet by mouth daily.   CRANBERRY PO, Take 1 capsule by mouth daily. (Patient not taking: Reported on 01/19/2024)   D-MANNOSE PO, Take by mouth.   Flaxseed, Linseed, (FLAXSEED OIL PO), Take 1 capsule by mouth daily.   Glucosamine-Chondroitin-MSM-D (TRIPLE FLEX/VITAMIN D3) TABS, Take 1 tablet by mouth daily.   Multiple Vitamin (MULTIVITAMIN) tablet, Take 1 tablet by mouth daily.    Omega-3 Fatty Acids (FISH OIL PO), Take 1 capsule by mouth daily.   Probiotic Product (FEM-DOPHILUS WOMENS) CAPS, Take 1 capsule by mouth daily.   tiZANidine  (ZANAFLEX ) 2 MG tablet, Take 1 tablet (2 mg total) by mouth at bedtime. (Patient not taking: Reported on 01/19/2024)   Turmeric 500 MG CAPS, Take 500 mg by  chills, night sweats, weight loss, swollen lymph nodes, body aches, joint swelling, chest pain, shortness of breath, mood changes. POSITIVE muscle aches  Objective  Last menstrual period 05/21/1998.   General: No apparent distress alert and oriented x3 mood and affect normal, dressed appropriately.  HEENT: Pupils equal, extraocular movements intact  Respiratory: Patient's speak in full sentences and does not appear short of breath  Cardiovascular: No lower extremity edema, non tender, no erythema      Impression and Recommendations:

## 2024-01-22 ENCOUNTER — Ambulatory Visit: Payer: Self-pay | Admitting: Internal Medicine

## 2024-01-27 LAB — METANEPHRINES, PLASMA
Metanephrine, Free: 29 pg/mL (ref <=57)
Normetanephrine, Free: 127 pg/mL (ref <=148)
Total Metanephrines-Plasma: 156 pg/mL (ref <=205)

## 2024-01-27 LAB — ALDOSTERONE + RENIN ACTIVITY W/ RATIO: Aldosterone: 9 ng/dL

## 2024-01-28 ENCOUNTER — Ambulatory Visit: Admitting: Family Medicine

## 2024-01-28 ENCOUNTER — Encounter: Payer: Self-pay | Admitting: Family Medicine

## 2024-01-28 VITALS — BP 126/90 | HR 75 | Ht 64.0 in | Wt 179.0 lb

## 2024-01-28 DIAGNOSIS — G5702 Lesion of sciatic nerve, left lower limb: Secondary | ICD-10-CM

## 2024-01-28 NOTE — Assessment & Plan Note (Signed)
 Repeat injection given again today, tolerated the procedure well, discussed icing regimen of home exercises, discussed differential includes lumbar radiculopathy.  Discussed icing regimen.  Discussed core strengthening exercises.  Follow-up again in 8 to 12 weeks

## 2024-01-28 NOTE — Patient Instructions (Addendum)
 Injected hip today See me again in 3 months

## 2024-02-01 ENCOUNTER — Other Ambulatory Visit: Payer: Self-pay | Admitting: Internal Medicine

## 2024-02-04 ENCOUNTER — Telehealth: Payer: Self-pay | Admitting: Nurse Practitioner

## 2024-02-04 NOTE — Telephone Encounter (Signed)
 Patient calling in with some question the dye for the CT. Please advise

## 2024-02-04 NOTE — Telephone Encounter (Signed)
 Called patient regarding her questions on CT scheduled for 9/4. Pt states she had an allergic reaction to contrast dye a few years back when she had a CT on her gallbladder. She stated it made her feel horrible and her body did not take it very well. She also is stating she feels much better since starting the medication, having zero chest pain or feeling palpitations anymore and if able to get up and go freely since taking off her zio monitor. She also was inquiring about zio monitor results. I did see that they had been interpreted by zio but not released by a provider here yet, which she understands.   Overall, patient is wondering if she can cancel and push out the CT and if that would be feasible given her medical conditions. She said can this be postponed or cancelled since she is feeling better.

## 2024-02-05 NOTE — Telephone Encounter (Signed)
 As previously noted, monitoring for has not been formally evaluated by one of our physicians.  I have reviewed the preliminary report.  There are multiple episodes of elevated heart rate stemming from the top chambers, called supraventricular tachycardia (SVT).  Most of these were very brief though the longest lasted up to 24.2 seconds.  There are also occasional isolated extra heartbeats stemming from the top chamber accounting for just less than 2% of all heartbeats.  There were no triggered events and therefore it is unclear if she felt any of the tachycardic episodes.  It is possible that she had a more prolonged episode back in August.  If she was symptomatic, I would recommend initiation of metoprolol  25 mg twice daily.  It is reasonable to arrange a follow-up appointment to discuss further prior to starting medication.  Regarding the CTA, contrast was not listed as an allergy, which is why that test was chosen.  In light of contrast allergy, which we can premedicate for, I would prefer an alternate test instead, simply to avoid any possible allergic reactions.  If she wishes to defer until after a follow-up appointment, I think that is very reasonable and we could potentially discuss pursuit of a stress Myoview.

## 2024-02-05 NOTE — Telephone Encounter (Signed)
 Patient has been made aware of monitor results. She has declined starting Metoprolol  as of right now.  The CTA has been canceled per her request. Appointment made for 03/09/24 to discussed the monitor results and a Myoview. She was unable to come in earlier than that due to traveling. She has been advised to call back if anything changes.

## 2024-02-09 ENCOUNTER — Ambulatory Visit: Payer: Self-pay

## 2024-02-09 DIAGNOSIS — R002 Palpitations: Secondary | ICD-10-CM

## 2024-02-09 NOTE — Telephone Encounter (Signed)
 Called patient for more information. She is having swelling in her legs- bilateral from the knee down. Compression hose and elevation help swelling but patient stated that she does not want to wear compression hose daily. She has stopped her amlodipine  as of yesterday. Denies any other acute symptoms. Would like a different medication so she does not have to depend on compression hose and elevating her legs to manage her swelling. She is unable to come in for an appointment this week but agreed to be seen next week if there is availability. Pt states she has an appt at 11:30 so would have to schedule appt at later time.   Ok for patient to remain off of amlodipine , monitor pressures until she can see you next week?

## 2024-02-09 NOTE — Telephone Encounter (Signed)
 She will need to monitor her blood pressure. She is currently taking losartan  25mg  bid. May need to increase losartan  dose vs additional medication if blood pressure increases. Confirm how pressure is doing now.

## 2024-02-09 NOTE — Telephone Encounter (Signed)
 BP readings:  129/77 (was not at home and could not remember any other readings)  All readings under 135/80  Pt has been scheduled to see you next Friday- work in for this.

## 2024-02-09 NOTE — Telephone Encounter (Signed)
 FYI Only or Action Required?: Action required by provider: clinical question for provider and update on patient condition. *Patient took a half pill of amlodipine  yesterday and has held her dose today so far until she hears back from PCP (BP reading 135/77 today)  Patient was last seen in primary care on 01/19/2024 by Glendia Shad, MD.  Called Nurse Triage reporting Leg Swelling.  Symptoms began several days ago.  Interventions attempted: Other: rest, compression socks.  Symptoms are: bilateral leg swelling (whole legs, was worse on the left knee but has improved) gradually improving.  Triage Disposition: See PCP When Office is Open (Within 3 Days)  Patient/caregiver understands and will follow disposition?: No, wishes to speak with PCP        Copied from CRM #[id number]. Topic: Clinical - Red Word Triage >> Feb 09, 2024  8:43 AM Mesmerise C wrote: [facility] that prompted transfer to Nurse Triage: Patient inquiring about amlodepine states been fine 3-4 weeks but on Saturday states she's been swelling on her legs and under her legs along with ankles, couldn't wear compression socks, swelling has went down but still swollen on knees, states she allergic to latex as well, BP was 135/77 took half a pill yesterday Reason for Disposition  [1] MILD swelling of both ankles (i.e., pedal edema) AND [2] new-onset or getting worse  Answer Assessment - Initial Assessment Questions 1. ONSET: When did the swelling start? (e.g., minutes, hours, days)     Saturday, she states she noticed it that night when she was going to bed.  2. LOCATION: What part of the leg is swollen?  Are both legs swollen or just one leg?     Whole legs : below knees, ankles. She states today both legs (she states the lower legs have improved but still some swelling in upper legs).  3. SEVERITY: How bad is the swelling? (e.g., localized; mild, moderate, severe)     She states there was a grapefruit sized localized  swelling, she states it was just below her knee medial side on both side but worse on the left (has resolved). Today she states it is mild or barely present.  4. REDNESS: Is there redness or signs of infection?     No.  5. PAIN: Is the swelling painful to touch? If Yes, ask: How painful is it?   (Scale 1-10; mild, moderate or severe)     She states not pain, just uncomfortable feeling  and denies any pain at this time.  6. FEVER: Do you have a fever? If Yes, ask: What is it, how was it measured, and when did it start?      No.  7. CAUSE: What do you think is causing the leg swelling?     Amlodipine . She states yesterday she only took half amlodipine  and states she was afraid to take the full pill due to the swelling. She reports her BP readings 135/77 this morning; 129/74 yesterday. She states she has not taken her amlodipine  yet and would like to know if Dr Glendia wants to her to take half or just stop the medication? She states amlodipine  is a new medication for her and she started it in early August.  8. MEDICAL HISTORY: Do you have a history of blood clots (e.g., DVT), cancer, heart failure, kidney disease, or liver failure?     No.  9. RECURRENT SYMPTOM: Have you had leg swelling before? If Yes, ask: When was the last time? What happened that time?  No.  10. OTHER SYMPTOMS: Do you have any other symptoms? (e.g., chest pain, difficulty breathing)       She states on Saturday she had difficulty walking due to heaviness and severity of swelling. Denies chest pain, difficulty breathing, facial or tongue swelling, arm swelling.  11. PREGNANCY: Is there any chance you are pregnant? When was your last menstrual period?       N/A.  Protocols used: Leg Swelling and Edema-A-AH

## 2024-02-10 ENCOUNTER — Ambulatory Visit: Payer: Self-pay | Admitting: Physician Assistant

## 2024-02-11 ENCOUNTER — Ambulatory Visit

## 2024-02-19 ENCOUNTER — Ambulatory Visit (INDEPENDENT_AMBULATORY_CARE_PROVIDER_SITE_OTHER): Admitting: Internal Medicine

## 2024-02-19 VITALS — BP 130/78 | HR 75 | Resp 16 | Ht 65.0 in | Wt 179.0 lb

## 2024-02-19 DIAGNOSIS — I10 Essential (primary) hypertension: Secondary | ICD-10-CM

## 2024-02-19 DIAGNOSIS — M25561 Pain in right knee: Secondary | ICD-10-CM | POA: Diagnosis not present

## 2024-02-19 NOTE — Progress Notes (Unsigned)
 Subjective:    Patient ID: Dawn Vargas, female    DOB: 1951/01/16, 73 y.o.   MRN: [id number]  Patient here for  Chief Complaint  Patient presents with   Medical Management of Chronic Issues    HPI Here for work in appt. Work in to discuss changing blood pressure medication. Saw cardiology 01/13/24. Recommended CTA. Also recommended zio monitor. Monitor - revealed 218 episodes - SVT. She reports varying blood pressures. Reviewed outside readings. Most recent readings - 126-130s/70-80s. Recently noticed her blood pressure increased in the evening. She has adjusted the timing she takes her losartan . Feels has helped. She stopped amlodipine . She is concerned she has POTS. States she was previously noticing weakness and getting shaky. Also given the blood pressure variation, she feels this is c/w POTS. States her daughters had mentioned this to her previously. She has adjusted her po intake of sodium. Wearing compression hose. Feels better and feels her blood pressure variation is better. Saw Dr Claudene 01/28/24 - right knee pain and left hip discomfort. S/p injection - left piriformis (hip injection).    Past Medical History:  Diagnosis Date   Allergic asthma    Allergic rhinitis    Arthritis    Chest pain    a. 06/2010 MV: no isch/infarct. Nl EF - later dx/ PUD.   Chronic back pain    s/p surgery for herniated disc 93/89)   Complication of anesthesia    Frequent UTI    GERD (gastroesophageal reflux disease)    pt. states no symptoms   History of bronchitis    History of kidney stones    History of shingles    Hypertension    PONV (postoperative nausea and vomiting)    pt. states history of nausea in 1989   PUD (peptic ulcer disease)    a. Dx 2012 in setting of chest and epigastric pain.   Pulmonary nodules    a. 03/2012 CT Chest: Stable tiny nodules in both lungs without new lesions.   Reactive airway disease    Rotator cuff tear    left shoulder- pt. states healing   Past  Surgical History:  Procedure Laterality Date   BACK SURGERY     CHOLECYSTECTOMY  August 25 2012   COLONOSCOPY  2013   ESOPHAGOGASTRODUODENOSCOPY  01/2011   EYE SURGERY     laporoscopy  08/22/1997   VAGINAL HYSTERECTOMY  1999   laparoscopic assisted   Family History  Problem Relation Age of Onset   Heart disease Mother        Stents in her 29's, died of MI in her 56's   Hypertension Mother    Heart failure Mother    Dementia Father    Prostate cancer Father    Hypertension Sister    CAD Sister        PCI in  her 47's   Heart disease Brother        angioplasty; pacemaker   Hypertension Brother    Breast cancer Cousin        maternal side-50's   Colon cancer Neg Hx    Social History   Socioeconomic History   Marital status: Single    Spouse name: Not on file   Number of children: Not on file   Years of education: Not on file   Highest education level: Not on file  Occupational History   Not on file  Tobacco Use   Smoking status: Never   Smokeless tobacco: Never  Substance  and Sexual Activity   Alcohol use: Yes    Comment: socially, rare glass of wine   Drug use: No   Sexual activity: Not on file  Other Topics Concern   Not on file  Social History Narrative   Divorced.  Lives in [place] by herself.  Active but does not routinely exercise.  Ambulation somewhat limited by back and R knee pain.   Social Drivers of Corporate investment banker Strain: Not on file  Food Insecurity: Not on file  Transportation Needs: Not on file  Physical Activity: Not on file  Stress: Not on file  Social Connections: Not on file     Review of Systems  Constitutional:  Negative for appetite change and unexpected weight change.  HENT:  Negative for congestion and sinus pressure.   Respiratory:  Negative for cough, chest tightness and shortness of breath.   Cardiovascular:  Negative for chest pain, palpitations and leg swelling.  Gastrointestinal:  Negative for abdominal pain,  diarrhea, nausea and vomiting.  Genitourinary:  Negative for difficulty urinating and dysuria.  Musculoskeletal:  Negative for joint swelling and myalgias.  Skin:  Negative for color change and rash.  Neurological:  Negative for dizziness and headaches.  Psychiatric/Behavioral:  Negative for agitation and dysphoric mood.        Objective:     BP 130/78   Pulse 75   Resp 16   Ht 5' 5 (1.651 m)   Wt 179 lb (81.2 kg)   LMP 05/21/1998   SpO2 99%   BMI 29.79 kg/m  Wt Readings from Last 3 Encounters:  02/19/24 179 lb (81.2 kg)  01/28/24 179 lb (81.2 kg)  01/19/24 178 lb 3.2 oz (80.8 kg)    Physical Exam Vitals reviewed.  Constitutional:      General: She is not in acute distress.    Appearance: Normal appearance.  HENT:     Head: Normocephalic and atraumatic.     Right Ear: External ear normal.     Left Ear: External ear normal.     Mouth/Throat:     Pharynx: No oropharyngeal exudate or posterior oropharyngeal erythema.  Eyes:     General: No scleral icterus.       Right eye: No discharge.        Left eye: No discharge.     Conjunctiva/sclera: Conjunctivae normal.  Neck:     Thyroid : No thyromegaly.  Cardiovascular:     Rate and Rhythm: Normal rate and regular rhythm.  Pulmonary:     Effort: No respiratory distress.     Breath sounds: Normal breath sounds. No wheezing.  Abdominal:     General: Bowel sounds are normal.     Palpations: Abdomen is soft.     Tenderness: There is no abdominal tenderness.  Musculoskeletal:        General: No tenderness.     Cervical back: Neck supple. No tenderness.     Comments: No increased swelling.   Lymphadenopathy:     Cervical: No cervical adenopathy.  Skin:    Findings: No erythema or rash.  Neurological:     Mental Status: She is alert.  Psychiatric:        Mood and Affect: Mood normal.        Behavior: Behavior normal.         Outpatient Encounter Medications as of 02/19/2024  Medication Sig   acetaminophen  (TYLENOL) 650 MG CR tablet Take 1,300 mg by mouth at bedtime. (Patient taking differently:  Take 1,300 mg by mouth as needed (for pain).)   B Complex-C (SUPER B COMPLEX PO) Take 1 tablet by mouth daily.   D-MANNOSE PO Take by mouth.   Flaxseed, Linseed, (FLAXSEED OIL PO) Take 1 capsule by mouth daily.   Glucosamine-Chondroitin-MSM-D (TRIPLE FLEX/VITAMIN D3) TABS Take 1 tablet by mouth daily.   losartan  (COZAAR ) 25 MG tablet Take 1 tablet by mouth twice daily   Multiple Vitamin (MULTIVITAMIN) tablet Take 1 tablet by mouth daily.   Omega-3 Fatty Acids (FISH OIL PO) Take 1 capsule by mouth daily.   Probiotic Product (FEM-DOPHILUS WOMENS) CAPS Take 1 capsule by mouth daily.   Turmeric 500 MG CAPS Take 500 mg by mouth daily.   [DISCONTINUED] amLODipine  (NORVASC ) 5 MG tablet Take 1 tablet by mouth once daily   [DISCONTINUED] CRANBERRY PO Take 1 capsule by mouth daily. (Patient not taking: Reported on 01/19/2024)   [DISCONTINUED] metoprolol  tartrate (LOPRESSOR ) 100 MG tablet TAKE 1 TABLET 2 HR PRIOR TO CARDIAC PROCEDURE   [DISCONTINUED] predniSONE  (DELTASONE ) 20 MG tablet Take 2 tablets (40 mg total) by mouth daily with breakfast. (Patient not taking: Reported on 01/19/2024)   [DISCONTINUED] tiZANidine  (ZANAFLEX ) 2 MG tablet Take 1 tablet (2 mg total) by mouth at bedtime. (Patient not taking: Reported on 01/19/2024)   No facility-administered encounter medications on file as of 02/19/2024.     Lab Results  Component Value Date   WBC 5.1 01/08/2024   HGB 14.5 01/08/2024   HCT 42.6 01/08/2024   PLT 220 01/08/2024   GLUCOSE 104 (H) 01/19/2024   CHOL 156 01/19/2024   TRIG 49.0 01/19/2024   HDL 69.30 01/19/2024   LDLCALC 77 01/19/2024   ALT 12 01/19/2024   AST 13 01/19/2024   NA 137 01/19/2024   K 3.8 01/19/2024   CL 101 01/19/2024   CREATININE 0.43 01/19/2024   BUN 10 01/19/2024   CO2 29 01/19/2024   TSH 1.006 01/08/2024   INR 0.98 02/22/2015   HGBA1C 5.5 11/10/2016    DG Chest 2  View Result Date: 01/08/2024 CLINICAL DATA:  Chest pain EXAM: CHEST - 2 VIEW COMPARISON:  X-ray 02/22/2015 FINDINGS: There is some linear opacity at the left lung base likely scar or atelectasis. Normal cardiopericardial silhouette. Calcified aorta. Overlapping cardiac leads. No consolidation, pneumothorax or effusion. No edema. Film is under penetrated. Degenerative changes of the spine on the lateral view. IMPRESSION: There is some linear opacity left left lung base likely scar or atelectasis. Otherwise no acute cardiopulmonary disease. Electronically Signed   By: Ranell Bring M.D.   On: 01/08/2024 11:57       Assessment & Plan:  Right knee pain, unspecified chronicity Assessment & Plan: Saw Dr Claudene 01/28/24 - right knee pain and left hip discomfort. S/p injection - left piriformis (hip injection).    Primary hypertension Assessment & Plan: Saw cardiology 01/13/24. Recommended CTA. Also recommended zio monitor. Monitor - revealed 218 episodes - SVT. She reports varying blood pressures. Reviewed outside readings. Most recent readings - 126-130s/70-80s. Recently noticed her blood pressure increased in the evening. She has adjusted the timing she takes her losartan . Feels has helped. She stopped amlodipine . She is concerned she has POTS. States she was previously noticing weakness and getting shaky. Also given the blood pressure variation, she feels this is c/w POTS. States her daughters had mentioned this to her previously. She has adjusted her po intake of sodium. Wearing compression hose. Feels better and feels her blood pressure variation is better. Discussed further  w/up and evaluation - wants to monitor. Continue f/u with cardiology.       Allena Hamilton, MD

## 2024-02-19 NOTE — Patient Instructions (Signed)
 Pepcid (famotidine) -20mg  - take one tablet 30 minutes before meal

## 2024-02-21 ENCOUNTER — Encounter: Payer: Self-pay | Admitting: Internal Medicine

## 2024-02-21 NOTE — Assessment & Plan Note (Signed)
 Saw Dr Claudene 01/28/24 - right knee pain and left hip discomfort. S/p injection - left piriformis (hip injection).

## 2024-02-21 NOTE — Assessment & Plan Note (Addendum)
 Saw cardiology 01/13/24. Recommended CTA. Also recommended zio monitor. Monitor - revealed 218 episodes - SVT. She reports varying blood pressures. Reviewed outside readings. Most recent readings - 126-130s/70-80s. Recently noticed her blood pressure increased in the evening. She has adjusted the timing she takes her losartan . Feels has helped. She stopped amlodipine . She is concerned she has POTS. States she was previously noticing weakness and getting shaky. Also given the blood pressure variation, she feels this is c/w POTS. States her daughters had mentioned this to her previously. She has adjusted her po intake of sodium. Wearing compression hose. Feels better and feels her blood pressure variation is better. Discussed further w/up and evaluation - wants to monitor. Continue f/u with cardiology.

## 2024-03-09 ENCOUNTER — Ambulatory Visit: Attending: Nurse Practitioner | Admitting: Nurse Practitioner

## 2024-03-09 ENCOUNTER — Encounter: Payer: Self-pay | Admitting: Nurse Practitioner

## 2024-03-09 VITALS — BP 150/90 | HR 95 | Ht 65.0 in | Wt 183.8 lb

## 2024-03-09 DIAGNOSIS — R002 Palpitations: Secondary | ICD-10-CM

## 2024-03-09 DIAGNOSIS — I10 Essential (primary) hypertension: Secondary | ICD-10-CM

## 2024-03-09 DIAGNOSIS — I471 Supraventricular tachycardia, unspecified: Secondary | ICD-10-CM | POA: Diagnosis not present

## 2024-03-09 DIAGNOSIS — R072 Precordial pain: Secondary | ICD-10-CM | POA: Diagnosis not present

## 2024-03-09 MED ORDER — METOPROLOL SUCCINATE ER 25 MG PO TB24: 12.5000 mg | ORAL_TABLET | Freq: Every day | ORAL | 3 refills | Status: DC

## 2024-03-09 NOTE — Patient Instructions (Signed)
 Medication Instructions:  Your physician recommends the following medication changes.  START TAKING: Toprol -XL 12.5 mg daily  *If you need a refill on your cardiac medications before your next appointment, please call your pharmacy*  Lab Work: None ordered at this time   Follow-Up: At [facility], you and your health needs are our priority.  As part of our continuing mission to provide you with exceptional heart care, our providers are all part of one team.  This team includes your primary Cardiologist (physician) and Advanced Practice Providers or APPs (Physician Assistants and Nurse Practitioners) who all work together to provide you with the care you need, when you need it.  Your next appointment:   3 month(s)  Provider:   You may see Caron Poser, MD or Lonni Meager, NP

## 2024-03-09 NOTE — Progress Notes (Signed)
 Office Visit    Patient Name: Dawn Vargas Date of Encounter: 03/09/2024  Primary Care Provider:  Glendia Shad, MD Primary Cardiologist:  None  Cardiology APP:  Vivienne Lonni Ingle, NP   Chief Complaint    73 y.o. female with a history of hypertension, chest pain, peptic ulcer disease, reactive airway disease, pulmonary nodules, and chronic back pain, who presents for follow-up related to chest pain, palpitations, and hypertension.   Past Medical History   Subjective   Past Medical History:  Diagnosis Date   Allergic asthma    Allergic rhinitis    Arthritis    Chest pain    a. 06/2010 MV: no isch/infarct. Nl EF - later dx/ PUD.   Chronic back pain    s/p surgery for herniated disc 93/89)   Complication of anesthesia    Frequent UTI    GERD (gastroesophageal reflux disease)    pt. states no symptoms   History of bronchitis    History of kidney stones    History of shingles    Hypertension    PONV (postoperative nausea and vomiting)    pt. states history of nausea in 1989   PUD (peptic ulcer disease)    a. Dx 2012 in setting of chest and epigastric pain.   Pulmonary nodules    a. 03/2012 CT Chest: Stable tiny nodules in both lungs without new lesions.   Reactive airway disease    Rotator cuff tear    left shoulder- pt. states healing   Past Surgical History:  Procedure Laterality Date   BACK SURGERY     CHOLECYSTECTOMY  August 25 2012   COLONOSCOPY  2013   ESOPHAGOGASTRODUODENOSCOPY  01/2011   EYE SURGERY     laporoscopy  08/22/1997   VAGINAL HYSTERECTOMY  1999   laparoscopic assisted    Allergies  Allergies  Allergen Reactions   Iodine Other (See Comments)    Sweating   Other Other (See Comments)    Smoke - severe reactive airway disease    Benadryl [Diphenhydramine Hcl] Hives   Oxycodone Other (See Comments)    dizzy   Amlodipine      Dizziness    Ativan [Lorazepam]     Somnolence   Gabapentin      Felt crazy   Iodinated Contrast  Media     Felt terrible - has had multiple CTAs in past   Beef-Derived Drug Products Other (See Comments)    Gi upset   Caladryl [Pramoxine-Calamine] Hives   Clarithromycin Other (See Comments)    Gi upset   Cleocin [Clindamycin Hcl] Other (See Comments)    GI upset    Egg-Derived Products Diarrhea   Hydrochlorothiazide Other (See Comments)    dizzy   Pegademase Bovine Nausea And Vomiting    Gi upset   Soybean-Containing Drug Products Diarrhea   Tomato Other (See Comments)    Small amount is okay, large amount causes diarrhea - Also potatoes       History of Present Illness      73 y.o. y/o female with a history of hypertension, chest pain, peptic ulcer disease, reactive airway disease, irritable bowel syndrome, pulmonary nodules, and chronic back pain. She has significant family history of CAD including her mother that died of MI and siblings with PCIs. She was prev eval for c/p in 2012, at which time stress testing was undertaken and showed normal LV function without ischemia or infarct. In the setting of HTN, she has been on losartan  for  many years and has tolerated well, taking 25 mg daily for at least the last 2 years.   In 12/2023, she presented to the ED with sharp headache, jitteriness, tremors to BUE, and overall weakness. BP was 234/114 in the ED. Unremarkable labs and CT. She was subsequently seen again at [facility] ED on July 25 for the same, BP 200/177 on that admission. ECG was unremarkable and she was d/c for PCP follow-up.   On January 08, 2024 while at home, she developed tachypalpitations, lightheadedness, and left chest discomfort radiating to her back prompting ED visit. Initial BP was 180/112, ECG SR at 81 with question of septal infarct. Unremarkable labs. She was discharged and f/u with PCP resulted in starting amlodipine  that she has since discontinued due to dizziness.   She presented to our office as a new patient on 01/13/2024 for further evaluation. Upon discussion and  due to continued intermittent episodes of palpitations and chest discomfort, a coronary CT angiogram was ordered that was since cancelled d/t subsequent report of intolerance to contrast (not true allergy per pt report). Due to her palpitations, we ordered a 2 week ZIO monitor. She was only able to tolerate the monitor for 2 days d/t unknown latex allergy causing skin irritation. However, in that brief time she had 218 nonsustained SVT, longest 24 seconds, fastest 160. No atrial fibrillation or flutter was seen.    Miss Risse presents to cardiology office today for follow-up. She states that she has been feeling okay since her previous visit. She just returned last night from a 3 week trip to [place]. She was under a lot of stress while there and stated that her blood pressure was elevated during that time, 150s/100s usually but that last night she was able to sleep well and this mornings read was 133/76. She also reports 3 episodes during her time there of jitteriness, weakness, and some palpitations. She states it usually came on in the morning while she was just sitting. She said when this happens her BP is elevated in the 150s/160s. She denies significant palpitations, in fact while wearing the ZIO she had no triggered events and stated that while wearing it she never felt palpitations or fluttering.  She denies any recent chest pain or discomfort. She believes she may have POTS so she has increased her salt intake (her daughters suggested she do this). She denies edema but states that she wears compression socks regularly (prev did not tolerate hydrochlorothiazide). She denies dyspnea, pnd, orthopnea, n, v, dizziness, syncope, edema, weight gain, or early satiety.   Objective   Home Medications    Current Outpatient Medications  Medication Sig Dispense Refill   acetaminophen (TYLENOL) 650 MG CR tablet Take 1,300 mg by mouth at bedtime. (Patient taking differently: Take 1,300 mg by mouth as needed (for  pain).)     B Complex-C (SUPER B COMPLEX PO) Take 1 tablet by mouth daily.     D-MANNOSE PO Take by mouth.     Flaxseed, Linseed, (FLAXSEED OIL PO) Take 1 capsule by mouth daily.     Glucosamine-Chondroitin-MSM-D (TRIPLE FLEX/VITAMIN D3) TABS Take 1 tablet by mouth daily.     losartan  (COZAAR ) 25 MG tablet Take 1 tablet by mouth twice daily 180 tablet 1   metoprolol  succinate (TOPROL  XL) 25 MG 24 hr tablet Take 0.5 tablets (12.5 mg total) by mouth daily. 45 tablet 3   Multiple Vitamin (MULTIVITAMIN) tablet Take 1 tablet by mouth daily.     Omega-3 Fatty Acids (  FISH OIL PO) Take 1 capsule by mouth daily.     Probiotic Product (FEM-DOPHILUS WOMENS) CAPS Take 1 capsule by mouth daily.     Turmeric 500 MG CAPS Take 500 mg by mouth daily.     No current facility-administered medications for this visit.     Physical Exam    VS:  BP (!) 150/90 (BP Location: Left Arm, Patient Position: Sitting, Cuff Size: Normal)   Pulse 95   Ht 5' 5 (1.651 m)   Wt 183 lb 12.8 oz (83.4 kg)   LMP 05/21/1998   SpO2 94%   BMI 30.59 kg/m  , BMI Body mass index is 30.59 kg/m.          GEN: Well nourished, well developed, in no acute distress. HEENT: normal. Neck: Supple, no JVD, carotid bruits, or masses. Cardiac: RRR, no murmurs, rubs, or gallops. No edema. No clubbing or cyanosis.   Radials 2+/PT 2+ and equal bilaterally.  Respiratory:  Respirations regular and unlabored, clear to auscultation bilaterally. GI: Soft, nontender, nondistended, BS + x 4. MS: no deformity or atrophy. Skin: warm and dry, no rash on exposed skin. Neuro:  Strength and sensation are intact. Psych: Normal affect.  Accessory Clinical Findings     Lab Results  Component Value Date   WBC 5.1 01/08/2024   HGB 14.5 01/08/2024   HCT 42.6 01/08/2024   MCV 90.1 01/08/2024   PLT 220 01/08/2024   Lab Results  Component Value Date   CREATININE 0.43 01/19/2024   BUN 10 01/19/2024   NA 137 01/19/2024   K 3.8 01/19/2024   CL  101 01/19/2024   CO2 29 01/19/2024   Lab Results  Component Value Date   ALT 12 01/19/2024   AST 13 01/19/2024   GGT 20 07/02/2017   ALKPHOS 59 01/19/2024   BILITOT 0.5 01/19/2024   Lab Results  Component Value Date   CHOL 156 01/19/2024   HDL 69.30 01/19/2024   LDLCALC 77 01/19/2024   TRIG 49.0 01/19/2024   CHOLHDL 2 01/19/2024    Lab Results  Component Value Date   HGBA1C 5.5 11/10/2016   Lab Results  Component Value Date   TSH 1.006 01/08/2024       Assessment & Plan    1.  PSVT: Patient with tachypalpitations previously in correlation with chest discomfort. Wore ZIO monitor for 2 days showing 218 nonsustained SVT, longest 24 seconds with max rate of 160.  No triggered events. No atrial fibrillation.  Interestingly, did not experience any palpitations while wearing monitor or since.  Prior lab workup revealed normal electrolytes and TSH.  We discussed that given high number of SVT episodes during brief period of time that she was monitored, it is possible that some of her symptoms of palpitations and jitteriness may be related to more prolonged episodes of SVT.  In that setting, we will plan to start Toprol -XL 12.5 mg daily.  She believes she has tried a beta-blocker in the past with poor tolerance, however we do not see that listed, and she was willing to try.  2. Primary HTN: Long history of hypertension managed with losartan  25 mg bid. She has previously tried numerous medications with unwanted side effects, mostly being dizziness. She was recently started on amlodipine  in addition to her losartan  d/t spikes of her blood pressure >200 mgHG systolic. She has since d/c amlodipine  as well due to dizziness. BP is 150/90 today in the office. Starting metoprolol  XL 12.5mg  daily. Will see how  she tolerates. If she is unable to tolerate and continues to have BP spikes in the setting of losartan  with intolerance to other meds, we may look at renal artery duplex to r/o renal artery  stenosis.  Pheochromocytoma workup previously negative.  In addition, if persistent hypertension despite maximally tolerated medical rx, can refer to HTN clinic in [place].   3. Precordial pain: Previous precordial pain in conjunction with hypertension and palpitations. She was unable to do coronary CT angiogram d/t contrast intolerance (felt terrible however has had multiple CTAs in the past). She has been pain free since her last visit. We agreed to hold off on further evaluation for now. Low threshold for stress testing if symptoms recur.  Adding ? blocker as outlined above.  4. Disposition: Start Metoprolol  12.5 XL daily. F/u in 3 months or sooner if necessary.   Lonni Meager, NP 03/09/2024, 6:15 PM

## 2024-03-15 ENCOUNTER — Encounter (HOSPITAL_COMMUNITY): Payer: Self-pay

## 2024-03-15 ENCOUNTER — Ambulatory Visit (INDEPENDENT_AMBULATORY_CARE_PROVIDER_SITE_OTHER): Admitting: Internal Medicine

## 2024-03-15 VITALS — BP 132/78 | HR 73 | Resp 16 | Ht 65.5 in | Wt 183.0 lb

## 2024-03-15 DIAGNOSIS — I10 Essential (primary) hypertension: Secondary | ICD-10-CM | POA: Diagnosis not present

## 2024-03-15 DIAGNOSIS — M1711 Unilateral primary osteoarthritis, right knee: Secondary | ICD-10-CM

## 2024-03-15 DIAGNOSIS — Z1231 Encounter for screening mammogram for malignant neoplasm of breast: Secondary | ICD-10-CM | POA: Diagnosis not present

## 2024-03-15 NOTE — Patient Instructions (Signed)
 YOUR MAMMOGRAM IS DUE, PLEASE CALL AND GET THIS SCHEDULED! [facility] - call [phone]

## 2024-03-15 NOTE — Progress Notes (Signed)
 Subjective:    Patient ID: Dawn Vargas, female    DOB: 08-12-1950, 73 y.o.   MRN: [id number]  Patient here for  Chief Complaint  Patient presents with   Medical Management of Chronic Issues    HPI Here for a scheduled follow up - follow up regarding hypertension, SVT and hypercholesterolemia.  Saw cardiology 01/13/24. Recommended CTA. Also recommended zio monitor. Monitor - revealed 218 episodes - SVT. Dawn Vargas Had f/u with cardiology 03/09/24 - recommended to start toprol  XL 12.5mg  daily. She is currently taking losartan  25mg  tid - she put herself on this dose. Had questions about starting metoprolol . Tries to stay active - stretches, exercises. Saw Dr Claudene 01/28/24 - right knee pain and left hip discomfort. S/p injection - left piriformis (hip injection). Has had issues with both knees. Right leg catches. Has used ice and heat. She will schedule mammogram.    Past Medical History:  Diagnosis Date   Allergic asthma    Allergic rhinitis    Arthritis    Chest pain    a. 06/2010 MV: no isch/infarct. Nl EF - later dx/ PUD.   Chronic back pain    s/p surgery for herniated disc 93/89)   Complication of anesthesia    Frequent UTI    GERD (gastroesophageal reflux disease)    pt. states no symptoms   History of bronchitis    History of kidney stones    History of shingles    Hypertension    PONV (postoperative nausea and vomiting)    pt. states history of nausea in 1989   PUD (peptic ulcer disease)    a. Dx 2012 in setting of chest and epigastric pain.   Pulmonary nodules    a. 03/2012 CT Chest: Stable tiny nodules in both lungs without new lesions.   Reactive airway disease    Rotator cuff tear    left shoulder- pt. states healing   Past Surgical History:  Procedure Laterality Date   BACK SURGERY     CHOLECYSTECTOMY  August 25 2012   COLONOSCOPY  2013   ESOPHAGOGASTRODUODENOSCOPY  01/2011   EYE SURGERY     laporoscopy  08/22/1997   VAGINAL HYSTERECTOMY  1999   laparoscopic assisted    Family History  Problem Relation Age of Onset   Heart disease Mother        Stents in her 64's, died of MI in her 50's   Hypertension Mother    Heart failure Mother    Dementia Father    Prostate cancer Father    Hypertension Sister    CAD Sister        PCI in  her 24's   Heart disease Brother        angioplasty; pacemaker   Hypertension Brother    Breast cancer Cousin        maternal side-50's   Colon cancer Neg Hx    Social History   Socioeconomic History   Marital status: Single    Spouse name: Not on file   Number of children: Not on file   Years of education: Not on file   Highest education level: Not on file  Occupational History   Not on file  Tobacco Use   Smoking status: Never   Smokeless tobacco: Never  Substance and Sexual Activity   Alcohol use: Yes    Comment: socially, rare glass of wine   Drug use: No   Sexual activity: Not on file  Other Topics Concern  Not on file  Social History Narrative   Divorced.  Lives in [place] by herself.  Active but does not routinely exercise.  Ambulation somewhat limited by back and R knee pain.   Social Drivers of Corporate investment banker Strain: Not on file  Food Insecurity: Not on file  Transportation Needs: Not on file  Physical Activity: Not on file  Stress: Not on file  Social Connections: Not on file     Review of Systems  Constitutional:  Negative for appetite change and unexpected weight change.  HENT:  Negative for congestion and sinus pressure.   Respiratory:  Negative for cough, chest tightness and shortness of breath.   Cardiovascular:  Negative for chest pain and leg swelling.       Intermittent fluttering as outlined.   Gastrointestinal:  Negative for abdominal pain, diarrhea, nausea and vomiting.  Genitourinary:  Negative for difficulty urinating and dysuria.  Musculoskeletal:  Negative for myalgias.       Knee pain as outlined. Leg issues as outlined.   Skin:  Negative for color  change and rash.  Neurological:  Negative for dizziness and headaches.  Psychiatric/Behavioral:  Negative for agitation and dysphoric mood.        Objective:     BP 132/78   Pulse 73   Resp 16   Ht 5' 5.5 (1.664 m)   Wt 183 lb (83 kg)   LMP 05/21/1998   SpO2 98%   BMI 29.99 kg/m  Wt Readings from Last 3 Encounters:  03/15/24 183 lb (83 kg)  03/09/24 183 lb 12.8 oz (83.4 kg)  02/19/24 179 lb (81.2 kg)    Physical Exam Vitals reviewed.  Constitutional:      General: She is not in acute distress.    Appearance: Normal appearance.  HENT:     Head: Normocephalic and atraumatic.     Right Ear: External ear normal.     Left Ear: External ear normal.     Mouth/Throat:     Pharynx: No oropharyngeal exudate or posterior oropharyngeal erythema.  Eyes:     General: No scleral icterus.       Right eye: No discharge.        Left eye: No discharge.     Conjunctiva/sclera: Conjunctivae normal.  Neck:     Thyroid : No thyromegaly.  Cardiovascular:     Rate and Rhythm: Normal rate and regular rhythm.  Pulmonary:     Effort: No respiratory distress.     Breath sounds: Normal breath sounds. No wheezing.  Abdominal:     General: Bowel sounds are normal.     Palpations: Abdomen is soft.     Tenderness: There is no abdominal tenderness.  Musculoskeletal:        General: No swelling or tenderness.     Cervical back: Neck supple. No tenderness.  Lymphadenopathy:     Cervical: No cervical adenopathy.  Skin:    Findings: No erythema or rash.  Neurological:     Mental Status: She is alert.  Psychiatric:        Mood and Affect: Mood normal.        Behavior: Behavior normal.         Outpatient Encounter Medications as of 03/15/2024  Medication Sig   losartan  (COZAAR ) 25 MG tablet Take 1 tablet by mouth twice daily (Patient taking differently: Take 25 mg by mouth in the morning, at noon, and at bedtime.)   acetaminophen (TYLENOL) 650 MG CR tablet Take  1,300 mg by mouth at  bedtime. (Patient taking differently: Take 1,300 mg by mouth as needed (for pain).)   B Complex-C (SUPER B COMPLEX PO) Take 1 tablet by mouth daily.   D-MANNOSE PO Take by mouth.   Flaxseed, Linseed, (FLAXSEED OIL PO) Take 1 capsule by mouth daily.   Glucosamine-Chondroitin-MSM-D (TRIPLE FLEX/VITAMIN D3) TABS Take 1 tablet by mouth daily.   metoprolol  succinate (TOPROL  XL) 25 MG 24 hr tablet Take 0.5 tablets (12.5 mg total) by mouth daily.   Multiple Vitamin (MULTIVITAMIN) tablet Take 1 tablet by mouth daily.   Omega-3 Fatty Acids (FISH OIL PO) Take 1 capsule by mouth daily.   Probiotic Product (FEM-DOPHILUS WOMENS) CAPS Take 1 capsule by mouth daily.   Turmeric 500 MG CAPS Take 500 mg by mouth daily.   No facility-administered encounter medications on file as of 03/15/2024.     Lab Results  Component Value Date   WBC 5.1 01/08/2024   HGB 14.5 01/08/2024   HCT 42.6 01/08/2024   PLT 220 01/08/2024   GLUCOSE 104 (H) 01/19/2024   CHOL 156 01/19/2024   TRIG 49.0 01/19/2024   HDL 69.30 01/19/2024   LDLCALC 77 01/19/2024   ALT 12 01/19/2024   AST 13 01/19/2024   NA 137 01/19/2024   K 3.8 01/19/2024   CL 101 01/19/2024   CREATININE 0.43 01/19/2024   BUN 10 01/19/2024   CO2 29 01/19/2024   TSH 1.006 01/08/2024   INR 0.98 02/22/2015   HGBA1C 5.5 11/10/2016    DG Chest 2 View Result Date: 01/08/2024 CLINICAL DATA:  Chest pain EXAM: CHEST - 2 VIEW COMPARISON:  X-ray 02/22/2015 FINDINGS: There is some linear opacity at the left lung base likely scar or atelectasis. Normal cardiopericardial silhouette. Calcified aorta. Overlapping cardiac leads. No consolidation, pneumothorax or effusion. No edema. Film is under penetrated. Degenerative changes of the spine on the lateral view. IMPRESSION: There is some linear opacity left left lung base likely scar or atelectasis. Otherwise no acute cardiopulmonary disease. Electronically Signed   By: Ranell Bring M.D.   On: 01/08/2024 11:57        Assessment & Plan:  Visit for screening mammogram -     3D Screening Mammogram, Left and Right; Future  Primary osteoarthritis of right knee Assessment & Plan: Tries to stay active - stretches, exercises. Saw Dr Claudene 01/28/24 - right knee pain and left hip discomfort. S/p injection - left piriformis (hip injection). Has had issues with both knees. Right leg catches. Has used ice and heat. Continue stretches, exercise and f/u with Dr Claudene.    Primary hypertension Assessment & Plan: Blood pressure as outlined.  Reviewed outside readings - 120-140s/70-90s. Saw cardiology 01/13/24. Recommended CTA. Also recommended zio monitor. Monitor - revealed 218 episodes - SVT. Dawn Vargas Had f/u with cardiology 03/09/24 - recommended to start toprol  XL 12.5mg  daily. She is currently taking losartan  25mg  tid - she put herself on this dose. Had questions about starting metoprolol . Will have her start toprol . Losartan  25mg  bid. If need to increase losartan  can adjust dose as discussed with pt. Follow metabolic panel.       Allena Hamilton, MD

## 2024-03-20 ENCOUNTER — Encounter: Payer: Self-pay | Admitting: Internal Medicine

## 2024-03-20 NOTE — Assessment & Plan Note (Signed)
 Tries to stay active - stretches, exercises. Saw Dr Claudene 01/28/24 - right knee pain and left hip discomfort. S/p injection - left piriformis (hip injection). Has had issues with both knees. Right leg catches. Has used ice and heat. Continue stretches, exercise and f/u with Dr Claudene.

## 2024-03-20 NOTE — Assessment & Plan Note (Signed)
 Blood pressure as outlined.  Reviewed outside readings - 120-140s/70-90s. Saw cardiology 01/13/24. Recommended CTA. Also recommended zio monitor. Monitor - revealed 218 episodes - SVT. Dawn Vargas Had f/u with cardiology 03/09/24 - recommended to start toprol  XL 12.5mg  daily. She is currently taking losartan  25mg  tid - she put herself on this dose. Had questions about starting metoprolol . Will have her start toprol . Losartan  25mg  bid. If need to increase losartan  can adjust dose as discussed with pt. Follow metabolic panel.

## 2024-03-30 DIAGNOSIS — K080 Exfoliation of teeth due to systemic causes: Secondary | ICD-10-CM | POA: Diagnosis not present

## 2024-04-05 ENCOUNTER — Ambulatory Visit
Admission: RE | Admit: 2024-04-05 | Discharge: 2024-04-05 | Disposition: A | Discharge status: Home / Self Care | Source: Ambulatory Visit | Attending: Internal Medicine | Admitting: Internal Medicine

## 2024-04-05 ENCOUNTER — Other Ambulatory Visit: Payer: Self-pay | Admitting: Internal Medicine

## 2024-04-05 DIAGNOSIS — N644 Mastodynia: Secondary | ICD-10-CM

## 2024-04-05 DIAGNOSIS — Z1231 Encounter for screening mammogram for malignant neoplasm of breast: Secondary | ICD-10-CM | POA: Insufficient documentation

## 2024-04-08 ENCOUNTER — Ambulatory Visit
Admission: RE | Admit: 2024-04-08 | Discharge: 2024-04-08 | Disposition: A | Discharge status: Home / Self Care | Source: Ambulatory Visit | Attending: Internal Medicine | Admitting: Internal Medicine

## 2024-04-08 DIAGNOSIS — N644 Mastodynia: Secondary | ICD-10-CM | POA: Insufficient documentation

## 2024-04-08 DIAGNOSIS — R92323 Mammographic fibroglandular density, bilateral breasts: Secondary | ICD-10-CM | POA: Diagnosis not present

## 2024-04-11 ENCOUNTER — Ambulatory Visit: Payer: Self-pay | Admitting: Internal Medicine

## 2024-04-12 ENCOUNTER — Ambulatory Visit: Payer: Self-pay | Admitting: Internal Medicine

## 2024-04-26 ENCOUNTER — Ambulatory Visit: Admitting: Internal Medicine

## 2024-04-26 VITALS — BP 123/78 | HR 75 | Temp 97.2°F | Ht 65.5 in | Wt 181.0 lb

## 2024-04-26 DIAGNOSIS — I10 Essential (primary) hypertension: Secondary | ICD-10-CM | POA: Diagnosis not present

## 2024-04-26 DIAGNOSIS — L659 Nonscarring hair loss, unspecified: Secondary | ICD-10-CM | POA: Diagnosis not present

## 2024-04-26 DIAGNOSIS — Z0000 Encounter for general adult medical examination without abnormal findings: Secondary | ICD-10-CM | POA: Diagnosis not present

## 2024-04-26 LAB — CBC WITH DIFFERENTIAL/PLATELET
Basophils Absolute: 0 K/uL (ref 0.0–0.1)
Basophils Relative: 1 % (ref 0.0–3.0)
Eosinophils Absolute: 0.1 K/uL (ref 0.0–0.7)
Eosinophils Relative: 2.6 % (ref 0.0–5.0)
HCT: 43 % (ref 36.0–46.0)
Hemoglobin: 14.6 g/dL (ref 12.0–15.0)
Lymphocytes Relative: 21.6 % (ref 12.0–46.0)
Lymphs Abs: 1 K/uL (ref 0.7–4.0)
MCHC: 34 g/dL (ref 30.0–36.0)
MCV: 89.4 fl (ref 78.0–100.0)
Monocytes Absolute: 0.5 K/uL (ref 0.1–1.0)
Monocytes Relative: 9.7 % (ref 3.0–12.0)
Neutro Abs: 3.1 K/uL (ref 1.4–7.7)
Neutrophils Relative %: 65.1 % (ref 43.0–77.0)
Platelets: 202 K/uL (ref 150.0–400.0)
RBC: 4.81 Mil/uL (ref 3.87–5.11)
RDW: 13.5 % (ref 11.5–15.5)
WBC: 4.7 K/uL (ref 4.0–10.5)

## 2024-04-26 LAB — BASIC METABOLIC PANEL WITH GFR
BUN: 12 mg/dL (ref 6–23)
CO2: 29 meq/L (ref 19–32)
Calcium: 9.5 mg/dL (ref 8.4–10.5)
Chloride: 100 meq/L (ref 96–112)
Creatinine, Ser: 0.58 mg/dL (ref 0.40–1.20)
GFR: 88.98 mL/min (ref >60.00)
Glucose, Bld: 94 mg/dL (ref 70–99)
Potassium: 4.1 meq/L (ref 3.5–5.1)
Sodium: 137 meq/L (ref 135–145)

## 2024-04-26 LAB — TSH: TSH: 1.55 u[IU]/mL (ref 0.35–5.50)

## 2024-04-26 LAB — LIPID PANEL
Cholesterol: 157 mg/dL (ref 0–200)
HDL: 74.6 mg/dL (ref >39.00)
LDL Cholesterol: 74 mg/dL (ref 0–99)
NonHDL: 82.19
Total CHOL/HDL Ratio: 2
Triglycerides: 42 mg/dL (ref 0.0–149.0)
VLDL: 8.4 mg/dL (ref 0.0–40.0)

## 2024-04-26 LAB — HEPATIC FUNCTION PANEL
ALT: 15 U/L (ref 0–35)
AST: 17 U/L (ref 0–37)
Albumin: 4.4 g/dL (ref 3.5–5.2)
Alkaline Phosphatase: 72 U/L (ref 39–117)
Bilirubin, Direct: 0.1 mg/dL (ref 0.0–0.3)
Total Bilirubin: 0.6 mg/dL (ref 0.2–1.2)
Total Protein: 6.6 g/dL (ref 6.0–8.3)

## 2024-04-26 MED ORDER — LOSARTAN POTASSIUM 25 MG PO TABS: ORAL_TABLET | ORAL | 3 refills | Status: DC

## 2024-04-26 NOTE — Assessment & Plan Note (Addendum)
 Physical today 04/26/24.  Mammogram (diagnostic) 04/08/24 - Birads I.  Overdue colonoscopy.  Referred to GI. Was scheduled to have colonoscopy. Needs colonoscopy.

## 2024-04-26 NOTE — Progress Notes (Signed)
 Subjective:    Patient ID: Dawn Vargas, female    DOB: 07-06-1950, 73 y.o.   MRN: [id number]  Patient here for  Chief Complaint  Patient presents with   Annual Exam    CPE    HPI Here for a physical exam. Saw cardiology 01/13/24. Recommended CTA. Also recommended zio monitor. Monitor - revealed 218 episodes - SVT. Dawn Vargas Had f/u with cardiology 03/09/24 - recommended to start toprol  XL 12.5mg  daily.  States toprol  caused light headedness and fatigue. Reviewed outside blood pressures. Most pressures averaging 120s/60-70s. Saw Dr Claudene 01/28/24 - right knee pain and left hip discomfort. S/p injection - left piriformis (hip injection). Has had issues with both knees. Right leg catches. Has used ice and heat    Past Medical History:  Diagnosis Date   Allergic asthma    Allergic rhinitis    Arthritis    Chest pain    a. 06/2010 MV: no isch/infarct. Nl EF - later dx/ PUD.   Chronic back pain    s/p surgery for herniated disc 93/89)   Complication of anesthesia    Frequent UTI    GERD (gastroesophageal reflux disease)    pt. states no symptoms   History of bronchitis    History of kidney stones    History of shingles    Hypertension    PONV (postoperative nausea and vomiting)    pt. states history of nausea in 1989   PUD (peptic ulcer disease)    a. Dx 2012 in setting of chest and epigastric pain.   Pulmonary nodules    a. 03/2012 CT Chest: Stable tiny nodules in both lungs without new lesions.   Reactive airway disease    Rotator cuff tear    left shoulder- pt. states healing   Past Surgical History:  Procedure Laterality Date   BACK SURGERY     CHOLECYSTECTOMY  August 25 2012   COLONOSCOPY  2013   ESOPHAGOGASTRODUODENOSCOPY  01/2011   EYE SURGERY     laporoscopy  08/22/1997   VAGINAL HYSTERECTOMY  1999   laparoscopic assisted   Family History  Problem Relation Age of Onset   Heart disease Mother        Stents in her 47's, died of MI in her 41's   Hypertension Mother     Heart failure Mother    Dementia Father    Prostate cancer Father    Hypertension Sister    CAD Sister        PCI in  her 60's   Breast cancer Cousin        maternal side-50's   Breast cancer Cousin    Heart disease Brother        angioplasty; pacemaker   Hypertension Brother    Colon cancer Neg Hx    Social History   Socioeconomic History   Marital status: Single    Spouse name: Not on file   Number of children: Not on file   Years of education: Not on file   Highest education level: Not on file  Occupational History   Not on file  Tobacco Use   Smoking status: Never   Smokeless tobacco: Never  Substance and Sexual Activity   Alcohol use: Yes    Comment: socially, rare glass of wine   Drug use: No   Sexual activity: Not on file  Other Topics Concern   Not on file  Social History Narrative   Divorced.  Lives in [place] by herself.  Active but does not routinely exercise.  Ambulation somewhat limited by back and R knee pain.   Social Drivers of Corporate Investment Banker Strain: Not on file  Food Insecurity: Not on file  Transportation Needs: Not on file  Physical Activity: Not on file  Stress: Not on file  Social Connections: Not on file     Review of Systems  Constitutional:  Negative for appetite change and unexpected weight change.  HENT:  Negative for congestion, sinus pressure and sore throat.   Eyes:  Negative for pain and visual disturbance.  Respiratory:  Negative for cough, chest tightness and shortness of breath.   Cardiovascular:  Negative for chest pain, palpitations and leg swelling.  Gastrointestinal:  Negative for abdominal pain, diarrhea, nausea and vomiting.  Genitourinary:  Negative for difficulty urinating and dysuria.  Musculoskeletal:  Negative for joint swelling.       Has had issues with her hip and knees.   Skin:  Negative for color change and rash.  Neurological:  Negative for dizziness and headaches.  Hematological:  Negative  for adenopathy. Does not bruise/bleed easily.  Psychiatric/Behavioral:  Negative for agitation and dysphoric mood.        Objective:     BP 123/78   Pulse 75   Temp (!) 97.2 F (36.2 C) (Oral)   Ht 5' 5.5 (1.664 m)   Wt 181 lb (82.1 kg)   LMP 05/21/1998   SpO2 99%   BMI 29.66 kg/m  Wt Readings from Last 3 Encounters:  05/03/24 184 lb (83.5 kg)  04/26/24 181 lb (82.1 kg)  03/15/24 183 lb (83 kg)    Physical Exam Vitals reviewed.  Constitutional:      General: She is not in acute distress.    Appearance: Normal appearance. She is well-developed.  HENT:     Head: Normocephalic and atraumatic.     Right Ear: External ear normal.     Left Ear: External ear normal.     Mouth/Throat:     Pharynx: No oropharyngeal exudate or posterior oropharyngeal erythema.  Eyes:     General: No scleral icterus.       Right eye: No discharge.        Left eye: No discharge.     Conjunctiva/sclera: Conjunctivae normal.  Neck:     Thyroid : No thyromegaly.  Cardiovascular:     Rate and Rhythm: Normal rate and regular rhythm.  Pulmonary:     Effort: No tachypnea, accessory muscle usage or respiratory distress.     Breath sounds: Normal breath sounds. No decreased breath sounds or wheezing.  Chest:  Breasts:    Right: No inverted nipple, mass, nipple discharge or tenderness (no axillary adenopathy).     Left: No inverted nipple, mass, nipple discharge or tenderness (no axilarry adenopathy).  Abdominal:     General: Bowel sounds are normal.     Palpations: Abdomen is soft.     Tenderness: There is no abdominal tenderness.  Musculoskeletal:        General: No swelling or tenderness.     Cervical back: Neck supple.  Lymphadenopathy:     Cervical: No cervical adenopathy.  Skin:    Findings: No erythema or rash.  Neurological:     Mental Status: She is alert and oriented to person, place, and time.  Psychiatric:        Mood and Affect: Mood normal.        Behavior: Behavior normal.  Outpatient Encounter Medications as of 04/26/2024  Medication Sig   acetaminophen (TYLENOL) 650 MG CR tablet Take 1,300 mg by mouth at bedtime. (Patient taking differently: Take 1,300 mg by mouth as needed (for pain).)   B Complex-C (SUPER B COMPLEX PO) Take 1 tablet by mouth daily.   D-MANNOSE PO Take by mouth.   Flaxseed, Linseed, (FLAXSEED OIL PO) Take 1 capsule by mouth daily.   Glucosamine-Chondroitin-MSM-D (TRIPLE FLEX/VITAMIN D3) TABS Take 1 tablet by mouth daily.   Multiple Vitamin (MULTIVITAMIN) tablet Take 1 tablet by mouth daily.   Omega-3 Fatty Acids (FISH OIL PO) Take 1 capsule by mouth daily.   Probiotic Product (FEM-DOPHILUS WOMENS) CAPS Take 1 capsule by mouth daily.   Turmeric 500 MG CAPS Take 500 mg by mouth daily.   losartan  (COZAAR ) 25 MG tablet Take 2 tablets in the am and one in the pm.   [DISCONTINUED] losartan  (COZAAR ) 25 MG tablet Take 1 tablet by mouth twice daily (Patient taking differently: Take 25 mg by mouth in the morning, at noon, and at bedtime.)   [DISCONTINUED] metoprolol  succinate (TOPROL  XL) 25 MG 24 hr tablet Take 0.5 tablets (12.5 mg total) by mouth daily. (Patient not taking: Reported on 04/26/2024)   No facility-administered encounter medications on file as of 04/26/2024.     Lab Results  Component Value Date   WBC 4.7 04/26/2024   HGB 14.6 04/26/2024   HCT 43.0 04/26/2024   PLT 202.0 04/26/2024   GLUCOSE 94 04/26/2024   CHOL 157 04/26/2024   TRIG 42.0 04/26/2024   HDL 74.60 04/26/2024   LDLCALC 74 04/26/2024   ALT 15 04/26/2024   AST 17 04/26/2024   NA 137 04/26/2024   K 4.1 04/26/2024   CL 100 04/26/2024   CREATININE 0.58 04/26/2024   BUN 12 04/26/2024   CO2 29 04/26/2024   TSH 1.55 04/26/2024   INR 0.98 02/22/2015   HGBA1C 5.5 11/10/2016    MM 3D DIAGNOSTIC MAMMOGRAM BILATERAL BREAST Result Date: 04/08/2024 CLINICAL DATA:  Focal LEFT breast pain at 7 o'clock x3 weeks. Due for annual. EXAM: DIGITAL DIAGNOSTIC  BILATERAL MAMMOGRAM WITH TOMOSYNTHESIS AND CAD; ULTRASOUND LEFT BREAST LIMITED TECHNIQUE: Bilateral digital diagnostic mammography and breast tomosynthesis was performed. The images were evaluated with computer-aided detection. ; Targeted ultrasound examination of the left breast was performed. COMPARISON:  Previous exam(s). ACR Breast Density Category b: There are scattered areas of fibroglandular density. FINDINGS: Spot compression tomosynthesis views were obtained over the area of focal pain in the LEFT breast. No suspicious mammographic finding is identified in this area. No suspicious mass, microcalcification, or other finding is identified in EITHER breast breast. Targeted LEFT breast ultrasound was performed in the area of pain at 7 o'clock 2 cm from the nipple. No suspicious solid or cystic mass is identified. Representative negative pictures were taken. IMPRESSION: No mammographic or sonographic etiology for focal LEFTbreast painidentified. There is no mammographic evidence of malignancy in EITHER breast. Breast pain is a common condition, which will often resolve on its own without intervention. It can be affected by hormonal changes, medication side effect, weight changes and fit of the bra. Pain may also be referred from other adjacent areas of the body. Breast pain may be improved by wearing adequate well-fitting support, over-the-counter topical and oral NSAID medication, low-fat diet, and ice/heat as needed. Studies have shown an improvement in cyclic pain with use of evening primrose oil, vitamin D  and vitamin E. Recommend discussion with your doctor or other provider before  starting any over the counter supplements. Clinical follow-up recommended to discuss any further work-up recommendations and appropriate treatment, which should be based on the clinical assessment. Findings and recommendations were discussed with the patient in person. RECOMMENDATION: Clinical follow-up of LEFT breast pain as  above. Patient may return to routine screening mammogram in 1 year.(Code:SM-B-01Y) I have discussed the findings and recommendations with the patient. If applicable, a reminder letter will be sent to the patient regarding the next appointment. BI-RADS CATEGORY  1: Negative. Electronically Signed   By: Norleen Croak M.D.   On: 04/08/2024 11:04   US  LIMITED ULTRASOUND INCLUDING AXILLA LEFT BREAST  Result Date: 04/08/2024 CLINICAL DATA:  Focal LEFT breast pain at 7 o'clock x3 weeks. Due for annual. EXAM: DIGITAL DIAGNOSTIC BILATERAL MAMMOGRAM WITH TOMOSYNTHESIS AND CAD; ULTRASOUND LEFT BREAST LIMITED TECHNIQUE: Bilateral digital diagnostic mammography and breast tomosynthesis was performed. The images were evaluated with computer-aided detection. ; Targeted ultrasound examination of the left breast was performed. COMPARISON:  Previous exam(s). ACR Breast Density Category b: There are scattered areas of fibroglandular density. FINDINGS: Spot compression tomosynthesis views were obtained over the area of focal pain in the LEFT breast. No suspicious mammographic finding is identified in this area. No suspicious mass, microcalcification, or other finding is identified in EITHER breast breast. Targeted LEFT breast ultrasound was performed in the area of pain at 7 o'clock 2 cm from the nipple. No suspicious solid or cystic mass is identified. Representative negative pictures were taken. IMPRESSION: No mammographic or sonographic etiology for focal LEFTbreast painidentified. There is no mammographic evidence of malignancy in EITHER breast. Breast pain is a common condition, which will often resolve on its own without intervention. It can be affected by hormonal changes, medication side effect, weight changes and fit of the bra. Pain may also be referred from other adjacent areas of the body. Breast pain may be improved by wearing adequate well-fitting support, over-the-counter topical and oral NSAID medication, low-fat  diet, and ice/heat as needed. Studies have shown an improvement in cyclic pain with use of evening primrose oil, vitamin D  and vitamin E. Recommend discussion with your doctor or other provider before starting any over the counter supplements. Clinical follow-up recommended to discuss any further work-up recommendations and appropriate treatment, which should be based on the clinical assessment. Findings and recommendations were discussed with the patient in person. RECOMMENDATION: Clinical follow-up of LEFT breast pain as above. Patient may return to routine screening mammogram in 1 year.(Code:SM-B-01Y) I have discussed the findings and recommendations with the patient. If applicable, a reminder letter will be sent to the patient regarding the next appointment. BI-RADS CATEGORY  1: Negative. Electronically Signed   By: Norleen Croak M.D.   On: 04/08/2024 11:04       Assessment & Plan:  Routine general medical examination at a health care facility  Primary hypertension Assessment & Plan: Blood pressure as outlined. Reviewed outside blood pressures - averaging 120s/60-70s. Off toprol . Did not tolerate. Is currently taking losartan  - total of 75mg  per day. Follow pressures. Follow metabolic panel.   Orders: -     Lipid panel -     Hepatic function panel -     Basic metabolic panel with GFR  Health care maintenance Assessment & Plan: Physical today 04/26/24.  Mammogram (diagnostic) 04/08/24 - Birads I.  Overdue colonoscopy.  Referred to GI. Was scheduled to have colonoscopy. Needs colonoscopy.    Hair loss -     CBC with Differential/Platelet -  TSH  Other orders -     Losartan  Potassium; Take 2 tablets in the am and one in the pm.  Dispense: 270 tablet; Refill: 3     Allena Hamilton, MD

## 2024-04-27 ENCOUNTER — Ambulatory Visit: Payer: Self-pay | Admitting: Internal Medicine

## 2024-04-27 ENCOUNTER — Other Ambulatory Visit: Payer: Self-pay

## 2024-04-27 DIAGNOSIS — K080 Exfoliation of teeth due to systemic causes: Secondary | ICD-10-CM | POA: Diagnosis not present

## 2024-04-27 NOTE — Telephone Encounter (Signed)
 See message below, I have pended what [facility] requested for your approval

## 2024-04-27 NOTE — Telephone Encounter (Signed)
 She needs the prescription written with 25mg  tablets. She spaces out the am losartan  - stating unable to tolerate the whole 50mg  at one time.  If I change rx to say 2-3 tablets q day, will they cover then?  Thanks

## 2024-04-27 NOTE — Telephone Encounter (Signed)
 Copied from CRM #[id number]. Topic: Clinical - Prescription Issue >> Apr 27, 2024  8:27 AM Mesmerise C wrote: Reason for CRM: [facility] from [facility] advised insurance won't pay for losartan  (COZAAR ) 25 MG tablet for 2 in am and 1 in evening without prior authorization states to send it for for 25mg  and 50mg 

## 2024-04-27 NOTE — Addendum Note (Signed)
 Addended by: BRIEN SHARENE RAMAN on: 04/27/2024 02:01 PM   Modules accepted: Orders

## 2024-04-27 NOTE — Addendum Note (Signed)
 Addended by: GLENDIA ALLENA RAMAN on: 04/27/2024 03:42 PM   Modules accepted: Orders

## 2024-04-28 NOTE — Progress Notes (Signed)
 [name] [address] [place] [address] Phone: [phone] Subjective:   ISusannah Gully, am serving as a scribe for Dr. Arthea Claudene.  I'm seeing this patient by the request  of:  Glendia Shad, MD  CC: shoulder pain follow up   YEP:Dlagzrupcz  01/28/2024 Repeat injection given again today, tolerated the procedure well, discussed icing regimen of home exercises, discussed differential includes lumbar radiculopathy.  Discussed icing regimen.  Discussed core strengthening exercises.  Follow-up again in 8 to 12 weeks     Updated 05/03/2024 Zimal J Thorson is a 73 y.o. female coming in with complaint of hip pain. Would like injection in shoulder today. Hips are doing better. Fell on 03/08/24 on front porch L knee hurt the most, but has gotten better. Pain comes and goes and pain now above knee cap.       Past Medical History:  Diagnosis Date   Allergic asthma    Allergic rhinitis    Arthritis    Chest pain    a. 06/2010 MV: no isch/infarct. Nl EF - later dx/ PUD.   Chronic back pain    s/p surgery for herniated disc 93/89)   Complication of anesthesia    Frequent UTI    GERD (gastroesophageal reflux disease)    pt. states no symptoms   History of bronchitis    History of kidney stones    History of shingles    Hypertension    PONV (postoperative nausea and vomiting)    pt. states history of nausea in 1989   PUD (peptic ulcer disease)    a. Dx 2012 in setting of chest and epigastric pain.   Pulmonary nodules    a. 03/2012 CT Chest: Stable tiny nodules in both lungs without new lesions.   Reactive airway disease    Rotator cuff tear    left shoulder- pt. states healing   Past Surgical History:  Procedure Laterality Date   BACK SURGERY     CHOLECYSTECTOMY  August 25 2012   COLONOSCOPY  2013   ESOPHAGOGASTRODUODENOSCOPY  01/2011   EYE SURGERY     laporoscopy  08/22/1997   VAGINAL HYSTERECTOMY  1999   laparoscopic assisted    Social History   Socioeconomic History   Marital status: Single    Spouse name: Not on file   Number of children: Not on file   Years of education: Not on file   Highest education level: Not on file  Occupational History   Not on file  Tobacco Use   Smoking status: Never   Smokeless tobacco: Never  Substance and Sexual Activity   Alcohol use: Yes    Comment: socially, rare glass of wine   Drug use: No   Sexual activity: Not on file  Other Topics Concern   Not on file  Social History Narrative   Divorced.  Lives in [place] by herself.  Active but does not routinely exercise.  Ambulation somewhat limited by back and R knee pain.   Social Drivers of Corporate Investment Banker Strain: Not on file  Food Insecurity: Not on file  Transportation Needs: Not on file  Physical Activity: Not on file  Stress: Not on file  Social Connections: Not on file   Allergies  Allergen Reactions   Iodine Other (See Comments)    Sweating   Other Other (See Comments)    Smoke - severe reactive airway disease    Benadryl [Diphenhydramine Hcl] Hives  Oxycodone Other (See Comments)    dizzy   Amlodipine      Dizziness    Ativan [Lorazepam]     Somnolence   Gabapentin      Felt crazy   Iodinated Contrast Media     Felt terrible - has had multiple CTAs in past   Bovine (Beef) Protein-Containing Drug Products Other (See Comments)    Gi upset   Caladryl [Pramoxine-Calamine] Hives   Clarithromycin Other (See Comments)    Gi upset   Cleocin [Clindamycin Hcl] Other (See Comments)    GI upset    Egg Protein-Containing Drug Products Diarrhea   Hydrochlorothiazide Other (See Comments)    dizzy   Pegademase Bovine Nausea And Vomiting    Gi upset   Soybean-Containing Drug Products Diarrhea   Tomato Other (See Comments)    Small amount is okay, large amount causes diarrhea - Also potatoes   Family History  Problem Relation Age of Onset   Heart disease Mother        Stents in  her 31's, died of MI in her 69's   Hypertension Mother    Heart failure Mother    Dementia Father    Prostate cancer Father    Hypertension Sister    CAD Sister        PCI in  her 23's   Breast cancer Cousin        maternal side-50's   Breast cancer Cousin    Heart disease Brother        angioplasty; pacemaker   Hypertension Brother    Colon cancer Neg Hx      Current Outpatient Medications (Cardiovascular):    losartan  (COZAAR ) 25 MG tablet, Take 2 tablets in the am and one in the pm.   metoprolol  succinate (TOPROL  XL) 25 MG 24 hr tablet, Take 0.5 tablets (12.5 mg total) by mouth daily. (Patient not taking: Reported on 04/26/2024)   Current Outpatient Medications (Analgesics):    acetaminophen (TYLENOL) 650 MG CR tablet, Take 1,300 mg by mouth at bedtime. (Patient taking differently: Take 1,300 mg by mouth as needed (for pain).)   Current Outpatient Medications (Other):    B Complex-C (SUPER B COMPLEX PO), Take 1 tablet by mouth daily.   D-MANNOSE PO, Take by mouth.   Flaxseed, Linseed, (FLAXSEED OIL PO), Take 1 capsule by mouth daily.   Glucosamine-Chondroitin-MSM-D (TRIPLE FLEX/VITAMIN D3) TABS, Take 1 tablet by mouth daily.   Multiple Vitamin (MULTIVITAMIN) tablet, Take 1 tablet by mouth daily.   Omega-3 Fatty Acids (FISH OIL PO), Take 1 capsule by mouth daily.   Probiotic Product (FEM-DOPHILUS WOMENS) CAPS, Take 1 capsule by mouth daily.   Turmeric 500 MG CAPS, Take 500 mg by mouth daily.   Reviewed prior external information including notes and imaging from  primary care provider As well as notes that were available from care everywhere and other healthcare systems.  Past medical history, social, surgical and family history all reviewed in electronic medical record.  No pertanent information unless stated regarding to the chief complaint.   Review of Systems:  No headache, visual changes, nausea, vomiting, diarrhea, constipation, dizziness, abdominal pain, skin  rash, fevers, chills, night sweats, weight loss, swollen lymph nodes, body aches, joint swelling, chest pain, shortness of breath, mood changes. POSITIVE muscle aches  Objective  Blood pressure 132/80, pulse 79, height 5' 5 (1.651 m), weight 184 lb (83.5 kg), last menstrual period 05/21/1998, SpO2 98%.   General: No apparent distress alert and oriented x3 mood  and affect normal, dressed appropriately.  HEENT: Pupils equal, extraocular movements intact  Respiratory: Patient's speak in full sentences and does not appear short of breath  Cardiovascular: No lower extremity edema, non tender, no erythema  Bilateral shoulders do have arthritic changes noted.  Patient does have some mild crepitus noted.  No significant weakness noted at this time.  After informed written and verbal consent, patient was seated on exam table. Right shoulder was prepped with alcohol swab and utilizing posterior approach, patient's right glenohumeral space was injected with 4:1  marcaine 0.5%: Kenalog  40mg /dL. Patient tolerated the procedure well without immediate complications.  After informed written and verbal consent, patient was seated on exam table. Left shoulder was prepped with alcohol swab and utilizing posterior approach, patient's left glenohumeral space was injected with 4:1  marcaine 0.5%: Kenalog  40mg /dL. Patient tolerated the procedure well without immediate complications.    Impression and Recommendations:     The above documentation has been reviewed and is accurate and complete Courtland Coppa M Emree Locicero, DO

## 2024-05-03 ENCOUNTER — Encounter: Payer: Self-pay | Admitting: Family Medicine

## 2024-05-03 ENCOUNTER — Ambulatory Visit: Admitting: Family Medicine

## 2024-05-03 VITALS — BP 132/80 | HR 79 | Ht 65.0 in | Wt 184.0 lb

## 2024-05-03 DIAGNOSIS — M25511 Pain in right shoulder: Secondary | ICD-10-CM | POA: Diagnosis not present

## 2024-05-03 DIAGNOSIS — M12811 Other specific arthropathies, not elsewhere classified, right shoulder: Secondary | ICD-10-CM | POA: Diagnosis not present

## 2024-05-03 DIAGNOSIS — M75102 Unspecified rotator cuff tear or rupture of left shoulder, not specified as traumatic: Secondary | ICD-10-CM | POA: Diagnosis not present

## 2024-05-03 DIAGNOSIS — M12812 Other specific arthropathies, not elsewhere classified, left shoulder: Secondary | ICD-10-CM | POA: Diagnosis not present

## 2024-05-03 NOTE — Assessment & Plan Note (Signed)
 Bilateral injections given again today.  Tolerated the procedure well, discussed icing regimen and home exercises, discussed which activities to do and which ones to avoid.  Increase activity slowly.  Patient has responded extremely well to the injections previously.  Follow-up again in 6 to 12 weeks

## 2024-05-03 NOTE — Patient Instructions (Signed)
 Injection in B shoulders today Good to see you! See you again in 3 months

## 2024-05-05 ENCOUNTER — Encounter: Payer: Self-pay | Admitting: Internal Medicine

## 2024-05-05 NOTE — Assessment & Plan Note (Signed)
 Blood pressure as outlined. Reviewed outside blood pressures - averaging 120s/60-70s. Off toprol . Did not tolerate. Is currently taking losartan  - total of 75mg  per day. Follow pressures. Follow metabolic panel.

## 2024-05-17 ENCOUNTER — Other Ambulatory Visit: Payer: Self-pay | Admitting: Internal Medicine

## 2024-05-17 NOTE — Telephone Encounter (Unsigned)
 Copied from CRM [id number]. Topic: Clinical - Medication Refill >> May 17, 2024  2:11 PM Suzen RAMAN wrote: Medication: losartan  (COZAAR ) 25 MG tablet(Take 2 tablets in the am and one in the pm.) for a total of 3 daily  Has the patient contacted their pharmacy? Yes   This is the patient's preferred pharmacy:  [facility] [address] ([address]), [place] - [address] [place]) [place] [address] Phone: [phone] Fax: [phone]  Is this the correct pharmacy for this prescription? Yes If no, delete pharmacy and type the correct one.   Has the prescription been filled recently? No  Is the patient out of the medication? No  Has the patient been seen for an appointment in the last year OR does the patient have an upcoming appointment? Yes  Can we respond through MyChart? Yes  Agent: Please be advised that Rx refills may take up to 3 business days. We ask that you follow-up with your pharmacy.

## 2024-05-18 ENCOUNTER — Other Ambulatory Visit: Payer: Self-pay | Admitting: Internal Medicine

## 2024-05-18 MED ORDER — LOSARTAN POTASSIUM 50 MG PO TABS: 50.0000 mg | ORAL_TABLET | Freq: Every day | ORAL | 1 refills | Status: AC

## 2024-05-18 MED ORDER — LOSARTAN POTASSIUM 25 MG PO TABS: 25.0000 mg | ORAL_TABLET | Freq: Every day | ORAL | 1 refills | Status: AC

## 2024-05-18 NOTE — Telephone Encounter (Signed)
 Rx ok'd for losartan . Please notify Maury.

## 2024-05-18 NOTE — Telephone Encounter (Signed)
 Pt called the office about her Losartan  rx. She stated that she went to the pharmacy today because she still has not been able to get her losartan  filled. Pt stated that the pharmacist told her that her insurance will not cover the losartan  25 mg three tablets a day. Pharmacist stated that she will need a rx for the 50 mg tablet to get once daily but pt will cut in half and then a rx for the 25 mg tablet once daily. I have pended the rxs for your approval.   Pt has enough medication to get her through the night.

## 2024-05-19 ENCOUNTER — Telehealth: Payer: Self-pay

## 2024-05-19 NOTE — Telephone Encounter (Signed)
 Copied from CRM #[id number]. Topic: Clinical - Medication Question >> May 18, 2024  4:15 PM Shereese L wrote: Reason for CRM: [facility] is calling to and stated to avoid the prior auth the pcp can send a  script for Losartan  Potassium (50 mg Oral Daily, with food) in addition to losartan  (COZAAR ) 25 MG tablet so that no prior auth is needed. To cover the 3 tablets that the Doctor wants her to take within the 24hr period Pharmacist is calling for the status of the  Losartan  Potassium (50 mg Oral Daily, with food)

## 2024-05-20 NOTE — Telephone Encounter (Unsigned)
 Copied from CRM #[id number]. Topic: General - Other >> May 20, 2024 12:05 PM Thersia C wrote: Reason for CRM: Patient called back in , stated she was able to pick up her losartan  yesterday afternoon

## 2024-05-20 NOTE — Telephone Encounter (Signed)
 Noted

## 2024-05-20 NOTE — Telephone Encounter (Signed)
 Attempted to call [facility], no one answered.  Attempted to call patient, no answer.  Left message to call office.    Was trying to find out if patient was able to pick up losartan . We have a message that I believe is old concerning changes that needed to be made

## 2024-06-03 NOTE — Progress Notes (Signed)
" °  Cardiology Office Note   Date:  06/13/2024  ID:  Dawn Vargas, DOB 06/05/1951, MRN [id number] PCP: Glendia Shad, MD  [facility] Providers Cardiologist:  Caron Poser, MD Cardiology APP:  Vivienne Lonni Ingle, NP     History of Present Illness Dawn Vargas is a 73 y.o. female PMH HTN, COPD who presents for follow-up of palpitations and HTN.  Seen by [place] Vivienne 03/09/2024.  Monitor was obtained previously which showed significant SVT burden.  Started on metoprolol  which she did not tolerate due to fatigue and dizziness.  TSH normal 04/2024.  Last LDL 74 04/2024  Relevant CVD History -Monitor 02/2024 mean heart rate 77 bpm, 218 episodes of SVT (longest 24 seconds with mean heart rate 100 bpm), 1.9% burden PACs, rare PVCs, no other arrhythmias seen.   ROS: Pt denies any chest discomfort, jaw pain, arm pain, palpitations, syncope, presyncope, orthopnea, PND, or LE edema.  Studies Reviewed I have independently reviewed the patient's ECG, previous cardiac testing, recent medical records, recent blood work.  Physical Exam VS:  BP (!) 158/98 (BP Location: Right Arm, Patient Position: Sitting)   Pulse 66   Ht 5' 5.5 (1.664 m)   Wt 182 lb (82.6 kg)   LMP 05/21/1998   SpO2 98%   BMI 29.83 kg/m        Wt Readings from Last 3 Encounters:  06/13/24 182 lb (82.6 kg)  05/03/24 184 lb (83.5 kg)  04/26/24 181 lb (82.1 kg)    GEN: No acute distress. NECK: No JVD; No carotid bruits. CARDIAC: RRR, no murmurs, rubs, gallops. RESPIRATORY:  Clear to auscultation. EXTREMITIES:  Warm and well-perfused. No edema.  ASSESSMENT AND PLAN Paroxysmal tachycardia PSVT Presents with a monitor showing frequent SVT.  She notes that she is minimally symptomatic from this and her heart rates during SVT episodes were only in the low 100s.  There was no atrial fibrillation or other sustained arrhythmias seen on the monitor.  We discussed SVT suppression versus watchful waiting for now  since she is asymptomatic.  Will plan for a watchful waiting approach for now.  Plan: - Echocardiogram to rule out structural causes of her SVT - Hold off on any suppressive therapy since she is minimally symptomatic and average heart rates during SVT episodes were in the low 100s.  I advised her to give us  a call should she have return of significant palpitations.  At that time, we should trial diltiazem since she has had adverse effects to beta-blockers in the past.  HTN Elevated in office, but she reports they are consistently 120s over 70s when she checks at home.  This is likely due to whitecoat hypertension.  We will keep her antihypertensive regimen the same.  Plan: - Continue losartan  75 mg daily        Dispo: RTC 6 months or sooner as needed  Signed, Caron Poser, MD  "

## 2024-06-13 ENCOUNTER — Encounter: Payer: Self-pay | Admitting: Internal Medicine

## 2024-06-13 ENCOUNTER — Ambulatory Visit

## 2024-06-13 VITALS — BP 158/98 | HR 66 | Ht 65.5 in | Wt 182.0 lb

## 2024-06-13 DIAGNOSIS — I10 Essential (primary) hypertension: Secondary | ICD-10-CM

## 2024-06-13 DIAGNOSIS — R002 Palpitations: Secondary | ICD-10-CM | POA: Diagnosis not present

## 2024-06-13 DIAGNOSIS — I479 Paroxysmal tachycardia, unspecified: Secondary | ICD-10-CM | POA: Diagnosis not present

## 2024-06-13 DIAGNOSIS — I471 Supraventricular tachycardia, unspecified: Secondary | ICD-10-CM | POA: Diagnosis not present

## 2024-06-13 NOTE — Patient Instructions (Signed)
 Medication Instructions:  Your physician recommends that you continue on your current medications as directed. Please refer to the Current Medication list given to you today.   *If you need a refill on your cardiac medications before your next appointment, please call your pharmacy*  Lab Work: None ordered at this time  If you have labs (blood work) drawn today and your tests are completely normal, you will receive your results only by: MyChart Message (if you have MyChart) OR A paper copy in the mail If you have any lab test that is abnormal or we need to change your treatment, we will call you to review the results.  Testing/Procedures: Your physician has requested that you have an echocardiogram. Echocardiography is a painless test that uses sound waves to create images of your heart. It provides your doctor with information about the size and shape of your heart and how well your hearts chambers and valves are working.   You may receive an ultrasound enhancing agent through an IV if needed to better visualize your heart during the echo. This procedure takes approximately one hour.  There are no restrictions for this procedure.  This will take place at [address] [facility] [address], [place] [address]  Please note: We ask at that you not bring children with you during ultrasound (echo/ vascular) testing. Due to room size and safety concerns, children are not allowed in the ultrasound rooms during exams. Our front office staff cannot provide observation of children in our lobby area while testing is being conducted. An adult accompanying a patient to their appointment will only be allowed in the ultrasound room at the discretion of the ultrasound technician under special circumstances. We apologize for any inconvenience.  Follow-Up: At [facility], you and your health needs are our priority.  As part of our continuing mission to provide you with exceptional heart  care, our providers are all part of one team.  This team includes your primary Cardiologist (physician) and Advanced Practice Providers or APPs (Physician Assistants and Nurse Practitioners) who all work together to provide you with the care you need, when you need it.  Your next appointment:   6 month(s)  Provider:   You may see Caron Poser, MD  We recommend signing up for the patient portal called MyChart.  Sign up information is provided on this After Visit Summary.  MyChart is used to connect with patients for Virtual Visits (Telemedicine).  Patients are able to view lab/test results, encounter notes, upcoming appointments, etc.  Non-urgent messages can be sent to your provider as well.   To learn more about what you can do with MyChart, go to [email or web address].

## 2024-06-15 ENCOUNTER — Ambulatory Visit

## 2024-07-18 ENCOUNTER — Ambulatory Visit

## 2024-08-03 ENCOUNTER — Ambulatory Visit: Admitting: Family Medicine
